# Patient Record
Sex: Female | Born: 1960
Health system: Southern US, Community
[De-identification: ages and names within clinical notes are randomized; demographics above are authoritative.]

## PROBLEM LIST (undated history)

## (undated) ENCOUNTER — Emergency Department (HOSPITAL_BASED_OUTPATIENT_CLINIC_OR_DEPARTMENT_OTHER): Payer: Commercial Managed Care - HMO | Source: Home / Self Care

## (undated) ENCOUNTER — Emergency Department (HOSPITAL_COMMUNITY): Admission: EM | Payer: No Typology Code available for payment source | Source: Home / Self Care

## (undated) DIAGNOSIS — K219 Gastro-esophageal reflux disease without esophagitis: Secondary | ICD-10-CM

## (undated) DIAGNOSIS — M7542 Impingement syndrome of left shoulder: Secondary | ICD-10-CM

## (undated) DIAGNOSIS — M654 Radial styloid tenosynovitis [de Quervain]: Secondary | ICD-10-CM

## (undated) DIAGNOSIS — E785 Hyperlipidemia, unspecified: Secondary | ICD-10-CM

## (undated) DIAGNOSIS — I10 Essential (primary) hypertension: Secondary | ICD-10-CM

## (undated) DIAGNOSIS — R51 Headache: Secondary | ICD-10-CM

## (undated) DIAGNOSIS — K589 Irritable bowel syndrome without diarrhea: Secondary | ICD-10-CM

## (undated) DIAGNOSIS — J302 Other seasonal allergic rhinitis: Secondary | ICD-10-CM

## (undated) DIAGNOSIS — A4902 Methicillin resistant Staphylococcus aureus infection, unspecified site: Secondary | ICD-10-CM

## (undated) DIAGNOSIS — L309 Dermatitis, unspecified: Secondary | ICD-10-CM

## (undated) DIAGNOSIS — Z9889 Other specified postprocedural states: Secondary | ICD-10-CM

## (undated) DIAGNOSIS — E119 Type 2 diabetes mellitus without complications: Secondary | ICD-10-CM

## (undated) HISTORY — PX: TONSILLECTOMY: SUR1361

## (undated) HISTORY — PX: APPENDECTOMY: SHX54

## (undated) HISTORY — DX: Headache: R51

## (undated) HISTORY — DX: Hyperlipidemia, unspecified: E78.5

## (undated) HISTORY — PX: ABDOMINAL HYSTERECTOMY: SHX81

## (undated) HISTORY — DX: Methicillin resistant Staphylococcus aureus infection, unspecified site: A49.02

## (undated) HISTORY — DX: Other seasonal allergic rhinitis: J30.2

---
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
## 1993-04-16 HISTORY — PX: BACK SURGERY: SHX140

## 1997-08-14 ENCOUNTER — Encounter (INDEPENDENT_AMBULATORY_CARE_PROVIDER_SITE_OTHER): Payer: Self-pay | Admitting: *Deleted

## 1997-08-14 LAB — CONVERTED CEMR LAB

## 1997-09-17 ENCOUNTER — Encounter: Admission: RE | Admit: 1997-09-17 | Discharge: 1997-09-17 | Payer: Self-pay | Admitting: Family Medicine

## 1997-10-06 ENCOUNTER — Encounter: Admission: RE | Admit: 1997-10-06 | Discharge: 1997-10-06 | Payer: Self-pay | Admitting: Family Medicine

## 1997-12-06 ENCOUNTER — Encounter: Admission: RE | Admit: 1997-12-06 | Discharge: 1997-12-06 | Payer: Self-pay | Admitting: Family Medicine

## 1997-12-17 ENCOUNTER — Encounter: Admission: RE | Admit: 1997-12-17 | Discharge: 1997-12-17 | Payer: Self-pay | Admitting: Family Medicine

## 1997-12-31 ENCOUNTER — Encounter: Admission: RE | Admit: 1997-12-31 | Discharge: 1997-12-31 | Payer: Self-pay | Admitting: Family Medicine

## 1998-04-06 ENCOUNTER — Encounter: Admission: RE | Admit: 1998-04-06 | Discharge: 1998-04-06 | Payer: Self-pay | Admitting: Sports Medicine

## 1998-06-13 ENCOUNTER — Encounter: Admission: RE | Admit: 1998-06-13 | Discharge: 1998-06-13 | Payer: Self-pay | Admitting: Family Medicine

## 1998-10-27 ENCOUNTER — Encounter: Admission: RE | Admit: 1998-10-27 | Discharge: 1998-10-27 | Payer: Self-pay | Admitting: Family Medicine

## 1998-11-18 ENCOUNTER — Encounter: Admission: RE | Admit: 1998-11-18 | Discharge: 1998-11-18 | Payer: Self-pay | Admitting: Family Medicine

## 1998-12-12 ENCOUNTER — Encounter: Admission: RE | Admit: 1998-12-12 | Discharge: 1998-12-12 | Payer: Self-pay | Admitting: Family Medicine

## 1999-02-17 ENCOUNTER — Encounter: Admission: RE | Admit: 1999-02-17 | Discharge: 1999-02-17 | Payer: Self-pay | Admitting: Family Medicine

## 1999-10-19 ENCOUNTER — Encounter: Admission: RE | Admit: 1999-10-19 | Discharge: 1999-10-19 | Payer: Self-pay | Admitting: Family Medicine

## 2000-02-01 ENCOUNTER — Encounter: Admission: RE | Admit: 2000-02-01 | Discharge: 2000-02-01 | Payer: Self-pay | Admitting: Family Medicine

## 2000-02-09 ENCOUNTER — Encounter (INDEPENDENT_AMBULATORY_CARE_PROVIDER_SITE_OTHER): Payer: Self-pay | Admitting: Internal Medicine

## 2000-02-09 ENCOUNTER — Encounter: Payer: Self-pay | Admitting: *Deleted

## 2000-02-09 ENCOUNTER — Encounter: Admission: RE | Admit: 2000-02-09 | Discharge: 2000-02-09 | Payer: Self-pay | Admitting: *Deleted

## 2000-02-23 ENCOUNTER — Encounter: Admission: RE | Admit: 2000-02-23 | Discharge: 2000-02-23 | Payer: Self-pay | Admitting: Family Medicine

## 2000-06-28 ENCOUNTER — Emergency Department (HOSPITAL_COMMUNITY): Admission: EM | Admit: 2000-06-28 | Discharge: 2000-06-29 | Payer: Self-pay | Admitting: Emergency Medicine

## 2000-06-29 ENCOUNTER — Encounter: Payer: Self-pay | Admitting: Emergency Medicine

## 2000-07-01 ENCOUNTER — Encounter: Admission: RE | Admit: 2000-07-01 | Discharge: 2000-07-01 | Payer: Self-pay | Admitting: Family Medicine

## 2000-07-16 ENCOUNTER — Encounter: Admission: RE | Admit: 2000-07-16 | Discharge: 2000-07-16 | Payer: Self-pay | Admitting: Sports Medicine

## 2000-10-10 ENCOUNTER — Emergency Department (HOSPITAL_COMMUNITY): Admission: EM | Admit: 2000-10-10 | Discharge: 2000-10-10 | Payer: Self-pay | Admitting: Emergency Medicine

## 2000-10-10 ENCOUNTER — Encounter: Payer: Self-pay | Admitting: Emergency Medicine

## 2001-01-01 ENCOUNTER — Encounter (INDEPENDENT_AMBULATORY_CARE_PROVIDER_SITE_OTHER): Payer: Self-pay | Admitting: Specialist

## 2001-01-01 ENCOUNTER — Encounter: Admission: RE | Admit: 2001-01-01 | Discharge: 2001-01-01 | Payer: Self-pay | Admitting: Family Medicine

## 2001-01-09 ENCOUNTER — Encounter: Admission: RE | Admit: 2001-01-09 | Discharge: 2001-01-09 | Payer: Self-pay | Admitting: Sports Medicine

## 2001-01-09 ENCOUNTER — Encounter: Payer: Self-pay | Admitting: Sports Medicine

## 2001-01-10 ENCOUNTER — Emergency Department (HOSPITAL_COMMUNITY): Admission: EM | Admit: 2001-01-10 | Discharge: 2001-01-10 | Payer: Self-pay | Admitting: Emergency Medicine

## 2001-01-13 ENCOUNTER — Encounter: Admission: RE | Admit: 2001-01-13 | Discharge: 2001-01-13 | Payer: Self-pay | Admitting: Family Medicine

## 2001-01-30 ENCOUNTER — Encounter: Admission: RE | Admit: 2001-01-30 | Discharge: 2001-01-30 | Payer: Self-pay | Admitting: Family Medicine

## 2001-02-20 ENCOUNTER — Encounter: Admission: RE | Admit: 2001-02-20 | Discharge: 2001-02-20 | Payer: Self-pay | Admitting: Obstetrics

## 2001-04-03 ENCOUNTER — Encounter: Admission: RE | Admit: 2001-04-03 | Discharge: 2001-04-03 | Payer: Self-pay | Admitting: Obstetrics

## 2001-04-28 ENCOUNTER — Inpatient Hospital Stay (HOSPITAL_COMMUNITY): Admission: RE | Admit: 2001-04-28 | Discharge: 2001-05-01 | Payer: Self-pay | Admitting: Obstetrics

## 2001-04-28 ENCOUNTER — Encounter (INDEPENDENT_AMBULATORY_CARE_PROVIDER_SITE_OTHER): Payer: Self-pay | Admitting: Specialist

## 2001-05-06 ENCOUNTER — Encounter: Admission: RE | Admit: 2001-05-06 | Discharge: 2001-05-06 | Payer: Self-pay | Admitting: Obstetrics

## 2001-05-10 ENCOUNTER — Inpatient Hospital Stay (HOSPITAL_COMMUNITY): Admission: AD | Admit: 2001-05-10 | Discharge: 2001-05-10 | Payer: Self-pay | Admitting: Obstetrics

## 2001-05-15 ENCOUNTER — Encounter: Admission: RE | Admit: 2001-05-15 | Discharge: 2001-05-15 | Payer: Self-pay | Admitting: Obstetrics

## 2001-05-27 ENCOUNTER — Encounter: Admission: RE | Admit: 2001-05-27 | Discharge: 2001-05-27 | Payer: Self-pay | Admitting: *Deleted

## 2001-06-10 ENCOUNTER — Encounter: Admission: RE | Admit: 2001-06-10 | Discharge: 2001-06-10 | Payer: Self-pay | Admitting: *Deleted

## 2001-06-17 ENCOUNTER — Encounter: Admission: RE | Admit: 2001-06-17 | Discharge: 2001-06-17 | Payer: Self-pay | Admitting: *Deleted

## 2001-07-07 ENCOUNTER — Encounter: Admission: RE | Admit: 2001-07-07 | Discharge: 2001-07-07 | Payer: Self-pay | Admitting: Obstetrics and Gynecology

## 2001-07-17 ENCOUNTER — Encounter: Admission: RE | Admit: 2001-07-17 | Discharge: 2001-07-17 | Payer: Self-pay | Admitting: *Deleted

## 2002-02-27 ENCOUNTER — Encounter: Admission: RE | Admit: 2002-02-27 | Discharge: 2002-02-27 | Payer: Self-pay | Admitting: *Deleted

## 2002-03-24 ENCOUNTER — Encounter: Admission: RE | Admit: 2002-03-24 | Discharge: 2002-03-24 | Payer: Self-pay | Admitting: Internal Medicine

## 2002-12-10 ENCOUNTER — Encounter: Admission: RE | Admit: 2002-12-10 | Discharge: 2002-12-10 | Payer: Self-pay | Admitting: Family Medicine

## 2002-12-10 ENCOUNTER — Encounter (INDEPENDENT_AMBULATORY_CARE_PROVIDER_SITE_OTHER): Payer: Self-pay | Admitting: Internal Medicine

## 2002-12-15 ENCOUNTER — Ambulatory Visit (HOSPITAL_COMMUNITY): Admission: RE | Admit: 2002-12-15 | Discharge: 2002-12-15 | Payer: Self-pay | Admitting: *Deleted

## 2003-07-09 ENCOUNTER — Emergency Department (HOSPITAL_COMMUNITY): Admission: AD | Admit: 2003-07-09 | Discharge: 2003-07-09 | Payer: Self-pay | Admitting: Family Medicine

## 2003-08-18 ENCOUNTER — Emergency Department (HOSPITAL_COMMUNITY): Admission: EM | Admit: 2003-08-18 | Discharge: 2003-08-18 | Payer: Self-pay | Admitting: Emergency Medicine

## 2003-08-20 ENCOUNTER — Encounter: Admission: RE | Admit: 2003-08-20 | Discharge: 2003-08-20 | Payer: Self-pay | Admitting: Internal Medicine

## 2003-08-27 ENCOUNTER — Encounter: Admission: RE | Admit: 2003-08-27 | Discharge: 2003-08-27 | Payer: Self-pay | Admitting: Internal Medicine

## 2003-09-24 ENCOUNTER — Ambulatory Visit (HOSPITAL_COMMUNITY): Admission: RE | Admit: 2003-09-24 | Discharge: 2003-09-24 | Payer: Self-pay | Admitting: Internal Medicine

## 2003-09-24 ENCOUNTER — Encounter: Admission: RE | Admit: 2003-09-24 | Discharge: 2003-09-24 | Payer: Self-pay | Admitting: Internal Medicine

## 2003-10-29 ENCOUNTER — Encounter: Admission: RE | Admit: 2003-10-29 | Discharge: 2003-10-29 | Payer: Self-pay | Admitting: Family Medicine

## 2003-11-12 ENCOUNTER — Emergency Department (HOSPITAL_COMMUNITY): Admission: EM | Admit: 2003-11-12 | Discharge: 2003-11-12 | Payer: Self-pay | Admitting: Emergency Medicine

## 2003-11-29 ENCOUNTER — Encounter: Admission: RE | Admit: 2003-11-29 | Discharge: 2003-11-29 | Payer: Self-pay | Admitting: Orthopedic Surgery

## 2003-12-23 ENCOUNTER — Ambulatory Visit (HOSPITAL_COMMUNITY): Admission: RE | Admit: 2003-12-23 | Discharge: 2003-12-23 | Payer: Self-pay | Admitting: Internal Medicine

## 2004-01-14 ENCOUNTER — Ambulatory Visit: Payer: Self-pay | Admitting: Internal Medicine

## 2004-01-27 ENCOUNTER — Ambulatory Visit: Payer: Self-pay | Admitting: Internal Medicine

## 2004-02-05 ENCOUNTER — Emergency Department (HOSPITAL_COMMUNITY): Admission: EM | Admit: 2004-02-05 | Discharge: 2004-02-05 | Payer: Self-pay | Admitting: Emergency Medicine

## 2004-02-10 ENCOUNTER — Ambulatory Visit: Payer: Self-pay | Admitting: Internal Medicine

## 2004-04-04 ENCOUNTER — Ambulatory Visit: Payer: Self-pay | Admitting: Internal Medicine

## 2004-07-17 ENCOUNTER — Ambulatory Visit: Payer: Self-pay | Admitting: Internal Medicine

## 2004-07-17 ENCOUNTER — Ambulatory Visit (HOSPITAL_COMMUNITY): Admission: RE | Admit: 2004-07-17 | Discharge: 2004-07-17 | Payer: Self-pay | Admitting: Internal Medicine

## 2004-08-09 ENCOUNTER — Ambulatory Visit: Payer: Self-pay | Admitting: Internal Medicine

## 2004-09-06 ENCOUNTER — Ambulatory Visit: Payer: Self-pay | Admitting: Internal Medicine

## 2005-03-30 ENCOUNTER — Ambulatory Visit: Payer: Self-pay

## 2005-08-04 ENCOUNTER — Emergency Department (HOSPITAL_COMMUNITY): Admission: EM | Admit: 2005-08-04 | Discharge: 2005-08-04 | Payer: Self-pay | Admitting: Emergency Medicine

## 2005-08-04 ENCOUNTER — Emergency Department (HOSPITAL_COMMUNITY): Admission: EM | Admit: 2005-08-04 | Discharge: 2005-08-04 | Payer: Self-pay | Admitting: Family Medicine

## 2005-08-07 ENCOUNTER — Ambulatory Visit: Payer: Self-pay | Admitting: Internal Medicine

## 2005-11-05 ENCOUNTER — Ambulatory Visit: Payer: Self-pay | Admitting: Internal Medicine

## 2006-04-04 DIAGNOSIS — M549 Dorsalgia, unspecified: Secondary | ICD-10-CM

## 2006-04-04 DIAGNOSIS — G8929 Other chronic pain: Secondary | ICD-10-CM

## 2006-06-10 ENCOUNTER — Encounter (INDEPENDENT_AMBULATORY_CARE_PROVIDER_SITE_OTHER): Payer: Self-pay | Admitting: *Deleted

## 2006-06-10 ENCOUNTER — Ambulatory Visit: Payer: Self-pay | Admitting: Internal Medicine

## 2006-06-10 DIAGNOSIS — R51 Headache: Secondary | ICD-10-CM

## 2006-06-10 DIAGNOSIS — R519 Headache, unspecified: Secondary | ICD-10-CM | POA: Insufficient documentation

## 2006-06-10 DIAGNOSIS — I10 Essential (primary) hypertension: Secondary | ICD-10-CM

## 2006-06-10 DIAGNOSIS — F329 Major depressive disorder, single episode, unspecified: Secondary | ICD-10-CM

## 2006-06-10 LAB — CONVERTED CEMR LAB
Bilirubin Urine: NEGATIVE
Ketones, ur: NEGATIVE mg/dL
Urobilinogen, UA: 0.2 (ref 0.0–1.0)
pH: 6 (ref 5.0–8.0)

## 2006-06-13 ENCOUNTER — Telehealth (INDEPENDENT_AMBULATORY_CARE_PROVIDER_SITE_OTHER): Payer: Self-pay | Admitting: *Deleted

## 2006-06-14 ENCOUNTER — Encounter (INDEPENDENT_AMBULATORY_CARE_PROVIDER_SITE_OTHER): Payer: Self-pay | Admitting: *Deleted

## 2006-06-17 ENCOUNTER — Encounter (INDEPENDENT_AMBULATORY_CARE_PROVIDER_SITE_OTHER): Payer: Self-pay | Admitting: Internal Medicine

## 2006-06-17 ENCOUNTER — Ambulatory Visit: Payer: Self-pay | Admitting: *Deleted

## 2006-06-17 LAB — CONVERTED CEMR LAB
Chlamydia, DNA Probe: NEGATIVE
GC Probe Amp, Genital: NEGATIVE
Nitrite: NEGATIVE
Protein, ur: NEGATIVE mg/dL
RBC / HPF: NONE SEEN (ref <3)
Specific Gravity, Urine: 1.02 (ref 1.005–1.03)

## 2006-06-18 ENCOUNTER — Telehealth (INDEPENDENT_AMBULATORY_CARE_PROVIDER_SITE_OTHER): Payer: Self-pay | Admitting: *Deleted

## 2006-09-25 ENCOUNTER — Telehealth (INDEPENDENT_AMBULATORY_CARE_PROVIDER_SITE_OTHER): Payer: Self-pay | Admitting: *Deleted

## 2006-10-25 ENCOUNTER — Encounter: Payer: Self-pay | Admitting: Internal Medicine

## 2006-10-25 ENCOUNTER — Ambulatory Visit: Payer: Self-pay | Admitting: Internal Medicine

## 2006-10-25 LAB — CONVERTED CEMR LAB
Albumin: 4.1 g/dL (ref 3.5–5.2)
Bilirubin Urine: NEGATIVE
Chloride: 104 meq/L (ref 96–112)
Cholesterol: 181 mg/dL (ref 0–200)
Hemoglobin, Urine: NEGATIVE
Ketones, ur: NEGATIVE mg/dL
Potassium: 4.3 meq/L (ref 3.5–5.3)
Protein, ur: NEGATIVE mg/dL
Specific Gravity, Urine: 1.022 (ref 1.005–1.03)
Total Bilirubin: 0.7 mg/dL (ref 0.3–1.2)
Total CHOL/HDL Ratio: 4.3
Total Protein: 7 g/dL (ref 6.0–8.3)
Triglycerides: 73 mg/dL (ref <150)
Urine Glucose: NEGATIVE mg/dL
Urobilinogen, UA: 0.2 (ref 0.0–1.0)

## 2006-11-14 ENCOUNTER — Encounter: Payer: Self-pay | Admitting: Internal Medicine

## 2006-11-14 ENCOUNTER — Ambulatory Visit: Payer: Self-pay | Admitting: Internal Medicine

## 2006-11-14 LAB — CONVERTED CEMR LAB
Bilirubin Urine: NEGATIVE
Chlamydia, DNA Probe: NEGATIVE
GC Probe Amp, Genital: NEGATIVE
Gardnerella vaginalis: NEGATIVE
Ketones, ur: NEGATIVE mg/dL
Leukocytes, UA: NEGATIVE
Specific Gravity, Urine: 1.027 (ref 1.005–1.03)
pH: 7 (ref 5.0–8.0)

## 2006-11-18 ENCOUNTER — Telehealth: Payer: Self-pay | Admitting: *Deleted

## 2007-02-24 ENCOUNTER — Encounter (INDEPENDENT_AMBULATORY_CARE_PROVIDER_SITE_OTHER): Payer: Self-pay | Admitting: Internal Medicine

## 2007-02-24 ENCOUNTER — Ambulatory Visit: Payer: Self-pay | Admitting: Hospitalist

## 2007-02-24 LAB — CONVERTED CEMR LAB
Basophils Absolute: 0 10*3/uL (ref 0.0–0.1)
Eosinophils Relative: 4 % (ref 0–5)
HCT: 36.7 % (ref 36.0–46.0)
Hemoglobin: 12 g/dL (ref 12.0–15.0)
Lymphocytes Relative: 38 % (ref 12–46)
Monocytes Absolute: 0.5 10*3/uL (ref 0.1–1.0)
Monocytes Relative: 7 % (ref 3–12)
Neutro Abs: 3.8 10*3/uL (ref 1.7–7.7)
RBC: 4.19 M/uL (ref 3.87–5.11)
RDW: 14.3 % (ref 11.5–15.5)

## 2007-03-09 ENCOUNTER — Encounter (INDEPENDENT_AMBULATORY_CARE_PROVIDER_SITE_OTHER): Payer: Self-pay | Admitting: Hospitalist

## 2007-03-09 ENCOUNTER — Emergency Department (HOSPITAL_COMMUNITY): Admission: EM | Admit: 2007-03-09 | Discharge: 2007-03-09 | Payer: Self-pay | Admitting: Emergency Medicine

## 2007-03-09 ENCOUNTER — Encounter (INDEPENDENT_AMBULATORY_CARE_PROVIDER_SITE_OTHER): Payer: Self-pay | Admitting: Internal Medicine

## 2007-03-18 ENCOUNTER — Ambulatory Visit: Payer: Self-pay | Admitting: Hospitalist

## 2007-05-05 ENCOUNTER — Encounter: Payer: Self-pay | Admitting: Internal Medicine

## 2007-05-20 ENCOUNTER — Ambulatory Visit: Payer: Self-pay | Admitting: Internal Medicine

## 2007-05-20 ENCOUNTER — Encounter: Payer: Self-pay | Admitting: Internal Medicine

## 2007-05-28 ENCOUNTER — Ambulatory Visit: Payer: Self-pay | Admitting: Internal Medicine

## 2007-06-04 ENCOUNTER — Encounter: Payer: Self-pay | Admitting: Internal Medicine

## 2007-06-12 ENCOUNTER — Ambulatory Visit: Payer: Self-pay | Admitting: Internal Medicine

## 2007-06-25 ENCOUNTER — Encounter: Payer: Self-pay | Admitting: Internal Medicine

## 2007-07-02 ENCOUNTER — Emergency Department (HOSPITAL_COMMUNITY): Admission: EM | Admit: 2007-07-02 | Discharge: 2007-07-03 | Payer: Self-pay | Admitting: Emergency Medicine

## 2007-07-04 ENCOUNTER — Ambulatory Visit: Payer: Self-pay | Admitting: Hospitalist

## 2007-07-04 ENCOUNTER — Ambulatory Visit (HOSPITAL_COMMUNITY): Admission: RE | Admit: 2007-07-04 | Discharge: 2007-07-04 | Payer: Self-pay | Admitting: Hospitalist

## 2007-09-18 ENCOUNTER — Ambulatory Visit (HOSPITAL_COMMUNITY): Admission: RE | Admit: 2007-09-18 | Discharge: 2007-09-18 | Payer: Self-pay | Admitting: Internal Medicine

## 2007-09-18 ENCOUNTER — Ambulatory Visit: Payer: Self-pay | Admitting: Internal Medicine

## 2007-09-18 DIAGNOSIS — M25519 Pain in unspecified shoulder: Secondary | ICD-10-CM

## 2007-10-20 ENCOUNTER — Encounter: Payer: Self-pay | Admitting: Internal Medicine

## 2007-11-19 ENCOUNTER — Ambulatory Visit: Payer: Self-pay | Admitting: Internal Medicine

## 2008-01-01 ENCOUNTER — Encounter: Admission: RE | Admit: 2008-01-01 | Discharge: 2008-02-03 | Payer: Self-pay | Admitting: Internal Medicine

## 2008-02-03 ENCOUNTER — Ambulatory Visit: Payer: Self-pay | Admitting: Internal Medicine

## 2008-02-03 ENCOUNTER — Encounter: Payer: Self-pay | Admitting: Internal Medicine

## 2008-02-03 DIAGNOSIS — K589 Irritable bowel syndrome without diarrhea: Secondary | ICD-10-CM | POA: Insufficient documentation

## 2008-02-03 DIAGNOSIS — L732 Hidradenitis suppurativa: Secondary | ICD-10-CM

## 2008-02-03 HISTORY — DX: Irritable bowel syndrome, unspecified: K58.9

## 2008-02-08 LAB — CONVERTED CEMR LAB
Cholesterol: 221 mg/dL — ABNORMAL HIGH (ref 0–200)
HDL: 49 mg/dL (ref >39)
LDL Cholesterol: 155 mg/dL — ABNORMAL HIGH (ref 0–99)
Triglycerides: 84 mg/dL (ref <150)

## 2008-02-10 ENCOUNTER — Encounter: Payer: Self-pay | Admitting: Internal Medicine

## 2008-02-20 ENCOUNTER — Encounter: Payer: Self-pay | Admitting: Internal Medicine

## 2008-02-28 ENCOUNTER — Emergency Department (HOSPITAL_COMMUNITY): Admission: EM | Admit: 2008-02-28 | Discharge: 2008-02-29 | Payer: Self-pay | Admitting: Emergency Medicine

## 2008-03-01 ENCOUNTER — Encounter: Payer: Self-pay | Admitting: Internal Medicine

## 2008-03-01 ENCOUNTER — Ambulatory Visit: Payer: Self-pay | Admitting: *Deleted

## 2008-03-19 ENCOUNTER — Emergency Department (HOSPITAL_COMMUNITY): Admission: EM | Admit: 2008-03-19 | Discharge: 2008-03-20 | Payer: Self-pay | Admitting: Emergency Medicine

## 2008-03-25 ENCOUNTER — Encounter: Payer: Self-pay | Admitting: Internal Medicine

## 2008-03-25 ENCOUNTER — Ambulatory Visit: Payer: Self-pay | Admitting: Internal Medicine

## 2008-03-25 LAB — CONVERTED CEMR LAB
ALT: 16 units/L (ref 0–35)
AST: 14 units/L (ref 0–37)
Albumin: 4.4 g/dL (ref 3.5–5.2)
Alkaline Phosphatase: 65 units/L (ref 39–117)
Basophils Absolute: 0 10*3/uL (ref 0.0–0.1)
Bilirubin Urine: NEGATIVE
Chloride: 102 meq/L (ref 96–112)
Eosinophils Absolute: 0.3 10*3/uL (ref 0.0–0.7)
Eosinophils Relative: 3 % (ref 0–5)
HCT: 39.6 % (ref 36.0–46.0)
Leukocytes, UA: NEGATIVE
MCHC: 32.6 g/dL (ref 30.0–36.0)
MCV: 86.3 fL (ref 78.0–100.0)
Monocytes Absolute: 0.5 10*3/uL (ref 0.1–1.0)
Nitrite: NEGATIVE
Platelets: 350 10*3/uL (ref 150–400)
Potassium: 3.7 meq/L (ref 3.5–5.3)
Protein, ur: NEGATIVE mg/dL
RDW: 14.4 % (ref 11.5–15.5)
Specific Gravity, Urine: 1.017 (ref 1.005–1.03)
Total Protein: 7.4 g/dL (ref 6.0–8.3)
Urobilinogen, UA: 1 (ref 0.0–1.0)
WBC: 9.7 10*3/uL (ref 4.0–10.5)

## 2008-03-29 ENCOUNTER — Encounter: Payer: Self-pay | Admitting: Internal Medicine

## 2008-03-29 ENCOUNTER — Encounter: Admission: RE | Admit: 2008-03-29 | Discharge: 2008-04-15 | Payer: Self-pay | Admitting: Internal Medicine

## 2008-04-19 ENCOUNTER — Encounter: Admission: RE | Admit: 2008-04-19 | Discharge: 2008-05-10 | Payer: Self-pay | Admitting: Internal Medicine

## 2008-04-19 ENCOUNTER — Emergency Department (HOSPITAL_COMMUNITY): Admission: EM | Admit: 2008-04-19 | Discharge: 2008-04-19 | Payer: Self-pay | Admitting: Emergency Medicine

## 2008-05-18 ENCOUNTER — Telehealth: Payer: Self-pay | Admitting: Internal Medicine

## 2008-05-19 ENCOUNTER — Ambulatory Visit: Payer: Self-pay | Admitting: *Deleted

## 2008-05-19 ENCOUNTER — Encounter: Payer: Self-pay | Admitting: Internal Medicine

## 2008-06-10 ENCOUNTER — Encounter: Admission: RE | Admit: 2008-06-10 | Discharge: 2008-09-08 | Payer: Self-pay | Admitting: Internal Medicine

## 2008-06-11 ENCOUNTER — Encounter: Payer: Self-pay | Admitting: Internal Medicine

## 2008-06-11 ENCOUNTER — Ambulatory Visit: Payer: Self-pay | Admitting: Internal Medicine

## 2008-06-18 ENCOUNTER — Ambulatory Visit (HOSPITAL_COMMUNITY): Admission: RE | Admit: 2008-06-18 | Discharge: 2008-06-18 | Payer: Self-pay | Admitting: Internal Medicine

## 2008-07-22 ENCOUNTER — Encounter: Payer: Self-pay | Admitting: Internal Medicine

## 2008-09-10 ENCOUNTER — Ambulatory Visit: Payer: Self-pay | Admitting: *Deleted

## 2008-09-10 ENCOUNTER — Encounter: Payer: Self-pay | Admitting: Internal Medicine

## 2008-09-16 ENCOUNTER — Ambulatory Visit: Payer: Self-pay | Admitting: Sports Medicine

## 2008-10-19 ENCOUNTER — Encounter: Payer: Self-pay | Admitting: Internal Medicine

## 2008-10-19 ENCOUNTER — Ambulatory Visit: Payer: Self-pay | Admitting: Internal Medicine

## 2008-11-01 ENCOUNTER — Encounter (INDEPENDENT_AMBULATORY_CARE_PROVIDER_SITE_OTHER): Payer: Self-pay | Admitting: Internal Medicine

## 2008-11-01 ENCOUNTER — Ambulatory Visit: Payer: Self-pay | Admitting: Infectious Diseases

## 2008-11-04 ENCOUNTER — Encounter (INDEPENDENT_AMBULATORY_CARE_PROVIDER_SITE_OTHER): Payer: Self-pay | Admitting: Internal Medicine

## 2008-11-04 LAB — CONVERTED CEMR LAB
HDL: 41 mg/dL (ref >39)
Total CHOL/HDL Ratio: 5.6
VLDL: 22 mg/dL (ref 0–40)

## 2008-11-05 DIAGNOSIS — E785 Hyperlipidemia, unspecified: Secondary | ICD-10-CM

## 2008-11-05 LAB — CONVERTED CEMR LAB
BUN: 14 mg/dL (ref 6–23)
Creatinine, Ser: 0.82 mg/dL (ref 0.40–1.20)
Eosinophils Absolute: 0.1 10*3/uL (ref 0.0–0.7)
Eosinophils Relative: 2 % (ref 0–5)
Glucose, Bld: 94 mg/dL (ref 70–99)
HCT: 40.4 % (ref 36.0–46.0)
Hemoglobin: 13.5 g/dL (ref 12.0–15.0)
Lymphocytes Relative: 21 % (ref 12–46)
Lymphs Abs: 1.4 10*3/uL (ref 0.7–4.0)
Monocytes Absolute: 0.6 10*3/uL (ref 0.1–1.0)
Platelets: 302 10*3/uL (ref 150–400)
Potassium: 4.1 meq/L (ref 3.5–5.3)
RBC: 4.63 M/uL (ref 3.87–5.11)
RDW: 14.1 % (ref 11.5–15.5)
Sodium: 140 meq/L (ref 135–145)
WBC: 6.8 10*3/uL (ref 4.0–10.5)

## 2008-11-12 ENCOUNTER — Ambulatory Visit: Payer: Self-pay | Admitting: Family Medicine

## 2008-12-09 ENCOUNTER — Encounter: Payer: Self-pay | Admitting: Internal Medicine

## 2008-12-09 ENCOUNTER — Ambulatory Visit: Payer: Self-pay | Admitting: Internal Medicine

## 2008-12-13 ENCOUNTER — Encounter: Payer: Self-pay | Admitting: Internal Medicine

## 2009-02-14 ENCOUNTER — Ambulatory Visit: Payer: Self-pay | Admitting: Family Medicine

## 2009-02-14 ENCOUNTER — Encounter (INDEPENDENT_AMBULATORY_CARE_PROVIDER_SITE_OTHER): Payer: Self-pay | Admitting: *Deleted

## 2009-04-28 ENCOUNTER — Ambulatory Visit: Payer: Self-pay | Admitting: Internal Medicine

## 2009-04-28 DIAGNOSIS — K219 Gastro-esophageal reflux disease without esophagitis: Secondary | ICD-10-CM | POA: Insufficient documentation

## 2009-04-29 ENCOUNTER — Encounter: Payer: Self-pay | Admitting: Internal Medicine

## 2009-05-11 ENCOUNTER — Encounter: Payer: Self-pay | Admitting: Internal Medicine

## 2009-05-11 ENCOUNTER — Telehealth: Payer: Self-pay | Admitting: Internal Medicine

## 2009-05-20 LAB — CONVERTED CEMR LAB
ALT: 14 units/L (ref 0–35)
AST: 16 units/L (ref 0–37)
Albumin: 4.3 g/dL (ref 3.5–5.2)
Basophils Relative: 0 % (ref 0–1)
Casts: NONE SEEN /lpf
Chloride: 104 meq/L (ref 96–112)
Cholesterol: 230 mg/dL — ABNORMAL HIGH (ref 0–200)
Eosinophils Absolute: 0.2 10*3/uL (ref 0.0–0.7)
Eosinophils Relative: 3 % (ref 0–5)
HCT: 40.6 % (ref 36.0–46.0)
Hemoglobin, Urine: NEGATIVE
Hemoglobin: 12.8 g/dL (ref 12.0–15.0)
Ketones, ur: NEGATIVE mg/dL
LDL Cholesterol: 154 mg/dL — ABNORMAL HIGH (ref 0–99)
MCHC: 31.5 g/dL (ref 30.0–36.0)
MCV: 86.4 fL (ref >=78.0)
Monocytes Absolute: 0.4 10*3/uL (ref 0.1–1.0)
Monocytes Relative: 6 % (ref 3–12)
Nitrite: NEGATIVE
Protein, ur: NEGATIVE mg/dL
RDW: 14.5 % (ref 11.5–15.5)
Specific Gravity, Urine: 1.021 (ref 1.005–1.0)
Total Protein: 7.1 g/dL (ref 6.0–8.3)
Triglycerides: 153 mg/dL — ABNORMAL HIGH (ref <150)
Urine Glucose: NEGATIVE mg/dL
pH: 6 (ref 5.0–8.0)

## 2009-06-02 ENCOUNTER — Ambulatory Visit: Payer: Self-pay | Admitting: Internal Medicine

## 2009-06-02 LAB — CONVERTED CEMR LAB: Pap Smear: NEGATIVE

## 2009-06-14 ENCOUNTER — Telehealth: Payer: Self-pay | Admitting: Internal Medicine

## 2009-07-04 ENCOUNTER — Ambulatory Visit: Payer: Self-pay | Admitting: Family Medicine

## 2009-07-04 ENCOUNTER — Emergency Department (HOSPITAL_COMMUNITY): Admission: EM | Admit: 2009-07-04 | Discharge: 2009-07-04 | Payer: Self-pay | Admitting: Emergency Medicine

## 2009-07-08 ENCOUNTER — Encounter: Payer: Self-pay | Admitting: Internal Medicine

## 2009-07-25 ENCOUNTER — Encounter: Payer: Self-pay | Admitting: Family Medicine

## 2009-07-25 ENCOUNTER — Telehealth: Payer: Self-pay | Admitting: Internal Medicine

## 2009-08-04 ENCOUNTER — Ambulatory Visit: Payer: Self-pay | Admitting: Internal Medicine

## 2009-08-04 LAB — CONVERTED CEMR LAB
AST: 9 units/L (ref 0–37)
Albumin: 4 g/dL (ref 3.5–5.2)
Alkaline Phosphatase: 64 units/L (ref 39–117)
Basophils Absolute: 0 10*3/uL (ref 0.0–0.1)
Chloride: 108 meq/L (ref 96–112)
Eosinophils Absolute: 0.1 10*3/uL (ref 0.0–0.7)
Eosinophils Relative: 1 % (ref 0–5)
Hemoglobin: 12.3 g/dL (ref 12.0–15.0)
Lymphs Abs: 2.8 10*3/uL (ref 0.7–4.0)
MCV: 86.2 fL (ref >=78.0)
Neutro Abs: 5.1 10*3/uL (ref 1.7–7.7)
Neutrophils Relative %: 60 % (ref 43–77)
Potassium: 4.2 meq/L (ref 3.5–5.3)
Total Bilirubin: 0.6 mg/dL (ref 0.3–1.2)
Total Protein: 6.7 g/dL (ref 6.0–8.3)
WBC: 8.5 10*3/uL (ref 4.0–10.5)

## 2009-08-11 ENCOUNTER — Encounter: Admission: RE | Admit: 2009-08-11 | Discharge: 2009-08-15 | Payer: Self-pay | Admitting: Family Medicine

## 2009-08-12 ENCOUNTER — Telehealth: Payer: Self-pay | Admitting: Family Medicine

## 2009-08-12 ENCOUNTER — Encounter: Payer: Self-pay | Admitting: Family Medicine

## 2009-08-29 ENCOUNTER — Ambulatory Visit: Payer: Self-pay | Admitting: Family Medicine

## 2009-09-16 ENCOUNTER — Telehealth: Payer: Self-pay | Admitting: Internal Medicine

## 2009-09-22 ENCOUNTER — Ambulatory Visit: Payer: Self-pay | Admitting: Internal Medicine

## 2009-09-26 ENCOUNTER — Ambulatory Visit: Payer: Self-pay | Admitting: Family Medicine

## 2009-09-26 DIAGNOSIS — M654 Radial styloid tenosynovitis [de Quervain]: Secondary | ICD-10-CM

## 2009-09-26 DIAGNOSIS — M19019 Primary osteoarthritis, unspecified shoulder: Secondary | ICD-10-CM | POA: Insufficient documentation

## 2009-09-26 HISTORY — DX: Radial styloid tenosynovitis (de quervain): M65.4

## 2009-11-10 ENCOUNTER — Ambulatory Visit: Payer: Self-pay | Admitting: Internal Medicine

## 2009-11-11 ENCOUNTER — Encounter: Payer: Self-pay | Admitting: Internal Medicine

## 2009-11-16 ENCOUNTER — Encounter: Payer: Self-pay | Admitting: Internal Medicine

## 2010-01-03 ENCOUNTER — Ambulatory Visit: Payer: Self-pay | Admitting: Internal Medicine

## 2010-01-03 DIAGNOSIS — R300 Dysuria: Secondary | ICD-10-CM

## 2010-01-03 LAB — CONVERTED CEMR LAB
Bilirubin Urine: NEGATIVE
Crystals: NONE SEEN
Ketones, ur: NEGATIVE mg/dL
Nitrite: NEGATIVE
Specific Gravity, Urine: >=1.03
Urobilinogen, UA: 0.2

## 2010-03-13 ENCOUNTER — Ambulatory Visit: Payer: Self-pay | Admitting: Family Medicine

## 2010-04-04 ENCOUNTER — Encounter: Payer: Self-pay | Admitting: Internal Medicine

## 2010-04-04 ENCOUNTER — Encounter: Payer: Self-pay | Admitting: Family Medicine

## 2010-04-12 ENCOUNTER — Ambulatory Visit: Payer: Self-pay | Admitting: Internal Medicine

## 2010-05-07 ENCOUNTER — Encounter: Payer: Self-pay | Admitting: Internal Medicine

## 2010-05-11 ENCOUNTER — Ambulatory Visit: Admit: 2010-05-11 | Payer: Self-pay | Admitting: Internal Medicine

## 2010-05-16 ENCOUNTER — Encounter: Payer: Self-pay | Admitting: Internal Medicine

## 2010-05-16 ENCOUNTER — Ambulatory Visit
Admission: RE | Admit: 2010-05-16 | Discharge: 2010-05-16 | Payer: Self-pay | Source: Home / Self Care | Attending: Internal Medicine | Admitting: Internal Medicine

## 2010-05-16 LAB — CONVERTED CEMR LAB
Cholesterol: 204 mg/dL — ABNORMAL HIGH (ref 0–200)
HDL: 49 mg/dL (ref >39)
TSH: 0.784 microintl units/mL (ref 0.350–4.50)

## 2010-05-16 NOTE — Assessment & Plan Note (Signed)
Summary: CHECKUP/SB.   Vital Signs:  Patient profile:   50 year old female Height:      62 inches (157.48 cm) Weight:      212.04 pounds (96.38 kg) BMI:     38.92 Temp:     97 degrees F (36.11 degrees C) oral Pulse rate:   78 / minute BP sitting:   125 / 81  (right arm)  Vitals Entered By: Angelina Ok RN (November 10, 2009 10:06 AM) CC: Depression Is Patient Diabetic? No Pain Assessment Patient in pain? yes     Location: left arm to wrist Intensity: 8 Type: aching Nutritional Status BMI of > 30 = obese  Does patient need assistance? Functional Status Self care Ambulation Normal Comments Check up.  Needs refill on Reflux med.   Primary Care Provider:  Phillips Odor  CC:  Depression.  History of Present Illness: Danielle Mason comes in today for follow-up on her shoulder pain on the left. The pain is intermittent goes from shoulder into wrist. Wrist support is helping. Some pain moving from neck into shouder. Often drops objects, pain wakes her from sleep. Splint helps some.  Depression History:      The patient denies a depressed mood most of the day.        The patient denies that she feels like life is not worth living, denies that she wishes that she were dead, and denies that she has thought about ending her life.         Preventive Screening-Counseling & Management  Alcohol-Tobacco     Alcohol drinks/day: 0     Smoking Status: never  Current Medications (verified): 1)  Hydrochlorothiazide 25 Mg Tabs (Hydrochlorothiazide) .... Take 1 Tablet By Mouth Once A Day 2)  Tylenol Extra Strength 500 Mg  Tabs (Acetaminophen) .... One Tab Every 6 Hours As Needed For Headaches 3)  Pravachol 20 Mg Tabs (Pravastatin Sodium) .... Take 1 Tablet By Mouth Once A Day 4)  Ibuprofen 800 Mg Tabs (Ibuprofen) .... Take 1 Tablet By Mouth Two Times A Day 5)  Doxycycline Hyclate 100 Mg Caps (Doxycycline Hyclate) .... Take 1 Tablet By Mouth Two Times A Day  Allergies (verified): 1)  ! Penicillin 2)   ! Penicillin  Review of Systems      See HPI  Physical Exam  General:  alert, well-developed, and well-nourished.   Neck:  supple. hypertonicity of trap muscles. Trigger point in supperior, medial Trap.  slightly limited ROM in sidebending and rotation, pain on extention. normal flexion. no adenopathy or enlarged thyroid. Lungs:  Normal respiratory effort, chest expands symmetrically. Lungs are clear to auscultation, no crackles or wheezes. Heart:  Normal rate and regular rhythm. S1 and S2 normal without gallop, murmur, click, rub or other extra sounds. Msk:  left shoulder higher than right on visual inspection with patient standing with flat feet on floor. Normal Strength-possible slight reduction in left >R but pateint is right hand dominant Pain on supraspinatus testing +crossover Pain directly over AC, radicular pain illicited on deep palpation, and circ ROM  AS tenderness left wrist.  Positive Finklestein's test. TTP especially over thumb abductor and extensor tendons.   NVI Pulses:  Normal Extremities:  No edema Neurologic:  strength normal in all extremities and sensation intact to light touch.   Skin:  no rashes.   Psych:  Slightly more depressed appaering than usual.   Impression & Recommendations:  Problem # 1:  HIDRADENITIS SUPPURATIVA (ICD-705.83) Will treat with brief course of doxy. Encouraged  warm compresses to axilla. Hypoallergenic deodorant only.  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 Will refer to orthopedist/Shoulder specialist - last option may be surgical- will explore this as a possibility and help patient make a decison about treatment/ Limited health literacy. She continues to do the gentle stretching and excercises I recommended.  The following medications were removed from the medication list:    Percocet 5-325 Mg Tabs (Oxycodone-acetaminophen) .Marland Kitchen... Take 1 tablet by mouth every 6  hours Her updated medication list for this problem includes:    Tylenol Extra Strength 500 Mg Tabs (Acetaminophen) ..... One tab every 6 hours as needed for headaches    Ibuprofen 800 Mg Tabs (Ibuprofen) .Marland Kitchen... Take 1 tablet by mouth two times a day  Problem # 3:  GERD (ICD-530.81) Symptoms improved. Will stop PPI.  The following medications were removed from the medication list:    Protonix 40 Mg Tbec (Pantoprazole sodium) .Marland Kitchen... Take 1 tablet by mouth once a day  Problem # 4:  HYPERLIPIDEMIA (ICD-272.4)  No problems with medication will check LFTs at next visit.  Her updated medication list for this problem includes:    Pravachol 20 Mg Tabs (Pravastatin sodium) .Marland Kitchen... Take 1 tablet by mouth once a day  Labs Reviewed: SGOT: 9 (08/04/2009)   SGPT: 13 (08/04/2009)   HDL:45 (04/28/2009), 41 (11/04/2008)  LDL:154 (04/28/2009), 165 (11/04/2008)  Chol:230 (04/28/2009), 228 (11/04/2008)  Trig:153 (04/28/2009), 108 (11/04/2008)  Problem # 5:  HYPERTENSION (ICD-401.9) Well controlled on HCTZ. No change. Her updated medication list for this problem includes:    Hydrochlorothiazide 25 Mg Tabs (Hydrochlorothiazide) .Marland Kitchen... Take 1 tablet by mouth once a day  BP today: 125/81 Prior BP: 124/83 (09/26/2009)  Labs Reviewed: K+: 4.2 (08/04/2009) Creat: : 0.81 (08/04/2009)   Chol: 230 (04/28/2009)   HDL: 45 (04/28/2009)   LDL: 154 (04/28/2009)   TG: 153 (04/28/2009)  Complete Medication List: 1)  Hydrochlorothiazide 25 Mg Tabs (Hydrochlorothiazide) .... Take 1 tablet by mouth once a day 2)  Tylenol Extra Strength 500 Mg Tabs (Acetaminophen) .... One tab every 6 hours as needed for headaches 3)  Pravachol 20 Mg Tabs (Pravastatin sodium) .... Take 1 tablet by mouth once a day 4)  Ibuprofen 800 Mg Tabs (Ibuprofen) .... Take 1 tablet by mouth two times a day 5)  Doxycycline Hyclate 100 Mg Caps (Doxycycline hyclate) .... Take 1 tablet by mouth two times a day  Other Orders: Orthopedic Referral  (Ortho)  Patient Instructions: 1)  F/U After you see the Shoulder Specialist Prescriptions: DOXYCYCLINE HYCLATE 100 MG CAPS (DOXYCYCLINE HYCLATE) Take 1 tablet by mouth two times a day  #14 x 2   Entered and Authorized by:   Julaine Fusi  DO   Signed by:   Julaine Fusi  DO on 11/11/2009   Method used:   Electronically to        [facility] [address] (retail)       [phone]       [place], [place]  [address]       Ph: [phone]       Fax: [phone]   RxID:   [id number]   Prevention & Chronic Care Immunizations   Influenza vaccine: refuses  (02/03/2008)   Influenza vaccine deferral: Refused  (10/19/2008)    Tetanus booster: Not documented   Td booster deferral: Refused  (10/19/2008)    Pneumococcal vaccine: Not  documented   Pneumococcal vaccine deferral: Refused  (10/19/2008)  Colorectal Screening   Hemoccult: Not documented    Colonoscopy: Not documented  Other Screening   Pap smear: NEGATIVE FOR INTRAEPITHELIAL LESIONS OR MALIGNANCY.  (06/02/2009)    Mammogram: No specific mammographic evidence of malignancy.    (09/20/2008)   Mammogram due: 09/2009   Smoking status: never  (11/10/2009)  Lipids   Total Cholesterol: 230  (04/28/2009)   Lipid panel action/deferral: Lipid Panel ordered   LDL: 154  (04/28/2009)   LDL Direct: Not documented   HDL: 45  (04/28/2009)   Triglycerides: 153  (04/28/2009)    SGOT (AST): 9  (08/04/2009)   SGPT (ALT): 13  (08/04/2009)   Alkaline phosphatase: 64  (08/04/2009)   Total bilirubin: 0.6  (08/04/2009)  Hypertension   Last Blood Pressure: 125 / 81  (11/10/2009)   Serum creatinine: 0.81  (08/04/2009)   BMP action: Ordered   Serum potassium 4.2  (08/04/2009)  Self-Management Support :    Patient will work on the following items until the next clinic visit to reach self-care goals:     Medications and monitoring: take my medicines every day, bring all of my medications to every  visit  (11/10/2009)     Eating: drink diet soda or water instead of juice or soda, eat more vegetables, use fresh or frozen vegetables, eat foods that are low in salt, eat baked foods instead of fried foods, eat fruit for snacks and desserts, limit or avoid alcohol  (11/10/2009)     Activity: take a 30 minute walk every day  (11/10/2009)    Hypertension self-management support: Written self-care plan, Education handout, Pre-printed educational material, Resources for patients handout  (11/10/2009)   Hypertension self-care plan printed.   Hypertension education handout printed    Lipid self-management support: Written self-care plan, Education handout, Pre-printed educational material, Resources for patients handout  (11/10/2009)   Lipid self-care plan printed.   Lipid education handout printed      Resource handout printed.     Vital Signs:  Patient profile:   50 year old female Height:      62 inches (157.48 cm) Weight:      212.04 pounds (96.38 kg) BMI:     38.92 Temp:     97 degrees F (36.11 degrees C) oral Pulse rate:   78 / minute BP sitting:   125 / 81  (right arm)  Vitals Entered By: Angelina Ok RN (November 10, 2009 10:06 AM)
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
## 2010-05-16 NOTE — Letter (Signed)
Summary: Work Excuse  [facility]  [address]   [place], [place] [address]   Phone: [phone]  Fax: [phone]    Today's Date: April 28, 2009  Name of Patient: Danielle Mason  The above named patient had a medical visit today at:  am / pm.  Please take this into consideration when reviewing the time away from work/school.    Special Instructions:  [  ] None  Arly.Keller ] To be off the remainder of today, returning to the normal work / school schedule tomorrow.  [  ] To be off until the next scheduled appointment on ______________________.  [  ] Other ________________________________________________________________ ________________________________________________________________________   Sincerely yours,   Julaine Fusi  DO

## 2010-05-16 NOTE — Assessment & Plan Note (Signed)
Summary: F/U,MC   Vital Signs:  Patient profile:   50 year old female BP sitting:   137 / 88  Vitals Entered By: Lillia Pauls CMA (March 13, 2010 3:05 PM)  Primary Care Provider:  Phillips Odor   History of Present Illness: Left wrist pain f/u  she had only a couple of days relief from the injection. She also had some skin hypopigmentation at site of injection and she has questions about that.  Current Medications (verified): 1)  Hydrochlorothiazide 25 Mg Tabs (Hydrochlorothiazide) .... Take 1 Tablet By Mouth Once A Day 2)  Tylenol Extra Strength 500 Mg  Tabs (Acetaminophen) .... One Tab Every 6 Hours As Needed For Headaches 3)  Pravachol 20 Mg Tabs (Pravastatin Sodium) .... Take 1 Tablet By Mouth Once A Day 4)  Ibuprofen 800 Mg Tabs (Ibuprofen) .... Take 1 Tablet By Mouth Two Times A Day 5)  Sulfamethoxazole-Tmp Ds 800-160 Mg Tabs (Sulfamethoxazole-Trimethoprim) .... Take 1 Tablet By Mouth Two Times A Day For 3 Days 6)  Phenazopyridine Hcl 100 Mg Tabs (Phenazopyridine Hcl) .... Take One Pill By Mouth Up To Three Times A Day As Needed For Painful Urination  Allergies: 1)  ! Penicillin 2)  ! Penicillin  Review of Systems  The patient denies fever.    Physical Exam  General:  alert and overweight-appearing.   Msk:  Left wrist mildy TTP  APL area but FROm and normal thumb strength.  SKIN: 3 mm x 6 mm hypopigmented area. 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 Take 1 tablet by mouth once a day 2)  Tylenol Extra Strength 500 Mg Tabs (Acetaminophen) .... One tab every 6 hours as needed for headaches 3)  Pravachol 20 Mg Tabs (Pravastatin sodium) .... Take 1  tablet by mouth once a day 4)  Ibuprofen 800 Mg Tabs (Ibuprofen) .... Take 1 tablet by mouth two times a day 5)  Sulfamethoxazole-tmp Ds 800-160 Mg Tabs (Sulfamethoxazole-trimethoprim) .... Take 1 tablet by mouth two times a day for 3 days 6)  Phenazopyridine Hcl 100 Mg Tabs (Phenazopyridine hcl) .... Take one pill by mouth up to three times a day as needed for painful urination   Orders Added: 1)  Est. Patient Level III [16109]

## 2010-05-16 NOTE — Progress Notes (Signed)
Summary: Colonoscopy  Phone Note From Other Clinic   Caller: Nurse Call For: Dr. Phillips Odor Summary of Call: Call from Dr. Marlane Hatcher office.  They have tried 4 times to schedule pt for a Colonoscopy.  Have left message and have not heard from pt.  Office will now send a letter to ask pt to call and to set up an appointment for the Colonoscopy. Angelina Ok RN  June 14, 2009 9:10 AM  Initial call taken by: Angelina Ok RN,  June 14, 2009 9:10 AM  Follow-up for Phone Call        Agree with plan- I know that at her last appointment she wanted to get this set up! Not sure what the disconnect is at this point.Will keep trying-thanks! Follow-up by: Julaine Fusi  DO,  June 23, 2009 7:04 PM
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
## 2010-05-16 NOTE — Assessment & Plan Note (Signed)
Summary: F/U/VS   Vital Signs:  Patient profile:   50 year old female Height:      62 inches (157.48 cm) Weight:      204 pounds (92.73 kg) BMI:     37.45 Temp:     96.6 degrees F (35.89 degrees C) oral Pulse rate:   86 / minute BP sitting:   132 / 82  (left arm) Cuff size:   regular  Vitals Entered By: Angelina Ok RN (June 02, 2009 8:49 AM) Is Patient Diabetic? No Pain Assessment Patient in pain? no      Nutritional Status BMI of > 30 = obese  Have you ever been in a relationship where you felt threatened, hurt or afraid?No   Does patient need assistance? Functional Status Self care Ambulation Normal Comments Physical.  Needs refills on meds.   Primary Care Provider:  Phillips Odor   History of Present Illness: Guy is in today for routine preventive care- PAP and refills. No new complaints. Shoulder continues to be painful, worse with lifting or overuse.  Depression History:      The patient denies a depressed mood most of the day and a diminished interest in her usual daily activities.        The patient denies that she feels like life is not worth living, denies that she wishes that she were dead, and denies that she has thought about ending her life.         Preventive Screening-Counseling & Management  Alcohol-Tobacco     Alcohol drinks/day: <1     Smoking Status: never  Allergies: 1)  ! Penicillin 2)  ! Penicillin  Physical Exam  General:  Well-developed,well-nourished,in no acute distress; alert,appropriate and cooperative throughout examination Neck:  No deformities, masses, or tenderness noted. Lungs:  Normal respiratory effort, chest expands symmetrically. Lungs are clear to auscultation, no crackles or wheezes. Heart:  Normal rate and regular rhythm. S1 and S2 normal without gallop, murmur, click, rub or other extra sounds. Abdomen:  Bowel sounds positive,abdomen soft and non-tender without masses, organomegaly or hernias  noted. Genitalia:  Normal introitus for age, no external lesions, no vaginal discharge, mucosa pink and moist, no vaginal or cervical lesions, no vaginal atrophy, no friaility or hemorrhage, normal uterus size and position, no adnexal masses or tenderness Msk:  No deformity or scoliosis noted of thoracic or lumbar spine.    Mildy limited ROM in left shoulder, trapezius muscle spasm noted. Extremities:  No clubbing, cyanosis, edema, or deformity noted     Impression & Recommendations:  Problem # 1:  HYPERTENSION (ICD-401.9)  Doing well on her medication. No change today.  Her updated medication list for this problem includes:    Hydrochlorothiazide 25 Mg Tabs (Hydrochlorothiazide) .Marland Kitchen... Take 1 tablet by mouth once a day  BP today: 132/82 Prior BP: 124/69 (04/28/2009)  Labs Reviewed: K+: 4.5 (04/28/2009) Creat: : 0.70 (04/28/2009)   Chol: 230 (04/28/2009)   HDL: 45 (04/28/2009)   LDL: 154 (04/28/2009)   TG: 153 (04/28/2009)  Problem # 2:  HYPERLIPIDEMIA (ICD-272.4)  Will start low dose statin today.  Her updated medication list for this problem includes:    Pravachol 20 Mg Tabs (Pravastatin sodium) .Marland Kitchen... Take 1 tablet by mouth once a day  Problem # 3:  Screening Cervical Cancer (ICD-V76.2) Pap today.  Complete Medication List: 1)  Hydrochlorothiazide 25 Mg Tabs (Hydrochlorothiazide) .... Take 1 tablet by mouth once a day 2)  Tylenol Extra Strength 500 Mg Tabs (Acetaminophen) .Marland KitchenMarland KitchenMarland Kitchen  One tab every 6 hours as needed for headaches 3)  Nasacort Aq 55 Mcg/act Aers (Triamcinolone acetonide(nasal)) .... Use 2 sprays in each nostril two times a day. 4)  Protonix 40 Mg Tbec (Pantoprazole sodium) .... Take 1 tablet by mouth once a day 5)  Pravachol 20 Mg Tabs (Pravastatin sodium) .... Take 1 tablet by mouth once a day 6)  Ibuprofen 800 Mg Tabs (Ibuprofen) .... Take 1 tablet by mouth two times a day  Other Orders: T-Pap Smear, Thin Prep ([id number])  Patient Instructions: 1)  Please schedule  a follow-up appointment in 2-3 months. Prescriptions: IBUPROFEN 800 MG TABS (IBUPROFEN) Take 1 tablet by mouth two times a day  #60 x 3   Entered and Authorized by:   Julaine Fusi  DO   Signed by:   Julaine Fusi  DO on 06/07/2009   Method used:   Electronically to        [facility] [address] (retail)       [phone]       [place], [place]  [address]       Ph: [phone]       Fax: [phone]   RxID:   [id number] PRAVACHOL 20 MG TABS (PRAVASTATIN SODIUM) Take 1 tablet by mouth once a day  #30 x 2   Entered and Authorized by:   Julaine Fusi  DO   Signed by:   Julaine Fusi  DO on 06/07/2009   Method used:   Electronically to        [facility] [address] (retail)       [phone]       [place], [place]  [address]       Ph: [phone]       Fax: [phone]   RxID:   [id number]   Prevention & Chronic Care Immunizations   Influenza vaccine: refuses  (02/03/2008)   Influenza vaccine deferral: Refused  (10/19/2008)    Tetanus booster: Not documented   Td booster deferral: Refused  (10/19/2008)    Pneumococcal vaccine: Not documented   Pneumococcal vaccine deferral: Refused  (10/19/2008)  Colorectal Screening   Hemoccult: Not documented    Colonoscopy: Not documented  Other Screening   Pap smear: Done.  (08/14/1997)    Mammogram: Not documented   Smoking status: never  (06/02/2009)  Lipids   Total Cholesterol: 230  (04/28/2009)   Lipid panel action/deferral: Lipid Panel ordered   LDL: 154  (04/28/2009)   LDL Direct: Not documented   HDL: 45  (04/28/2009)   Triglycerides: 153  (04/28/2009)    SGOT (AST): 16  (04/28/2009)   SGPT (ALT): 14  (04/28/2009)   Alkaline phosphatase: 63  (04/28/2009)   Total bilirubin: 0.4  (04/28/2009)  Hypertension   Last Blood Pressure: 132 / 82  (06/02/2009)   Serum creatinine: 0.70  (04/28/2009)   BMP action: Ordered   Serum potassium 4.5   (04/28/2009)  Self-Management Support :    Patient will work on the following items until the next clinic visit to reach self-care goals:     Medications and monitoring: take my medicines every day, bring all of my medications to every visit  (06/02/2009)     Eating: drink diet soda or water instead of juice or soda, eat more vegetables, eat foods that are low in salt, eat fruit for snacks and desserts  (06/02/2009)  Activity: take a 30 minute walk every day  (06/02/2009)    Hypertension self-management support: Not documented    Lipid self-management support: Not documented   Process Orders Check Orders Results:     [facility]: ABN not required for this insurance Tests Sent for requisitioning (June 07, 2009 2:53 PM):     06/02/2009: [facility] -- T-Pap Smear, Thin Prep [[id number]] (signed)

## 2010-05-16 NOTE — Letter (Signed)
Summary: [facility] PT Referral Form  [facility] PT Referral Form   Imported By: Marily Memos 07/04/2009 16:03:45  _____________________________________________________________________  External Attachment:    Type:   Image     Comment:   External Document
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
## 2010-05-16 NOTE — Miscellaneous (Signed)
Summary: PT eval: somnolence  Clinical Lists Changes Spoke w Becky from PT. Danielle Mason was seen for PT, seemed less than enthusiastic about restarting PT. Part of this is her $45 co-pay. She also said tehy had tried "all of these things" (posture training, scapular exercise, etc) at last go round of PT and it did not work. After speaking W Becky, we decided to d/c her from PT. I reviewed her MRI which showed no rotator cuff tear but some tendinopathy.  We have tried injection therapy with minimal result.  [facility] Note, becky said Danielle Engen was extremely somnolent during her PT eval--she actually fell asleep during grip testing. She told PT that she was not sleeping well at night. I had stareted her on gabapentin but even if she has tapered all of the way up she would only be on 300 mg. I will have my nurse call and ask about the gabapentin and if she is sleepier than usual. Also send this note to her PCP, Dr Phillips Odor.
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
## 2010-05-16 NOTE — Assessment & Plan Note (Signed)
Summary: F/U FOOT,MC   Vital Signs:  Patient profile:   50 year old female BP sitting:   136 / 93  Vitals Entered By: Lillia Pauls CMA (Aug 29, 2009 2:05 PM)  Primary Care Provider:  Phillips Odor   History of Present Illness: f/u left shouldr pain. No better really---she is frustrated. The gabapentin did not seem to help after about 4 days so she stopped it. Does not want to restart exercises either at hime or w PT for her rotator cuff. Says pain is now all down into her arm on left--even into her hand.  Allergies: 1)  ! Penicillin 2)  ! Penicillin  Review of Systems  The patient denies anorexia, fever, and weight loss.    Physical Exam  General:  alert and overweight-appearing.   Msk:  Shoulder are symmetrical and scapula move normally. Left arm is tender everywhere I touch it and she is also tender to any examination of forearm. FROm of B shulders although pain w supraspinatus testing. Very limited strength against resistance in any plane on left up-er arm exam.  Trapezius muscle has three areas of muscle spasm identified by plpation and this seems to cause some increased generalized left arm pain. distally she is neurovascularly intact. Additional Exam:  Patient given informed consent for injection. Discussed possible complications of infection, bleeding or skin atrophy at site of injection. Possible side effect of avascular necrosis (focal area of bone death) due to steroid use.Appropriate verbal time out taken Are cleaned and prepped in usual sterile fashion. A ---1- cc kennalog plus ---2-cc 1% lidocaine without epinephrine was injected into the--three trigger points oif the left trapezius muscle-. Patient tolerated procedure well with no complications.    Impression & Recommendations:  Problem # 1:  SHOULDER PAIN, LEFT (ICD-719.41)  shoulder pain tat does not seem to be related to her rotator cuff--I think it may be from trapezius spasm. we will try trigger point injection--I  have gven her 2 scap stabilizing exercises to do and f/u 33m. I do not think we will be able to get her completely  pain free as her pain is multifactorial and may have some component of fibromyalgia.  Orders: Trigger Point Injection (1 or 2 muscles) ([id number]) Kenalog 10 mg inj ([id number])  Complete Medication List: 1)  Hydrochlorothiazide 25 Mg Tabs (Hydrochlorothiazide) .... Take 1 tablet by mouth once a day 2)  Tylenol Extra Strength 500 Mg Tabs (Acetaminophen) .... One tab every 6 hours as needed for headaches 3)  Nasacort Aq 55 Mcg/act Aers (Triamcinolone acetonide(nasal)) .... Use 2 sprays in each nostril two times a day. 4)  Protonix 40 Mg Tbec (Pantoprazole sodium) .... Take 1 tablet by mouth once a day 5)  Pravachol 20 Mg Tabs (Pravastatin sodium) .... Take 1 tablet by mouth once a day 6)  Ibuprofen 800 Mg Tabs (Ibuprofen) .... Take 1 tablet by mouth two times a day 7)  Gabapentin 100 Mg Caps (Gabapentin) .... Taper up to thrr two times a day patient has written taper 8)  Hydrocodone-acetaminophen 5-500 Mg Tabs (Hydrocodone-acetaminophen) .... Take 1 tablet by mouth every 6 hours as needed for pain
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
## 2010-05-16 NOTE — Progress Notes (Signed)
    Summary of Call: Neeton please call her--phtysical therapy said she was falling asleep during their eval. I had started her on gabapentin. Ask 1) is she taking the 100 mg tabs---2) how many times a day---3) does she feel sleepier than usual and 4) does she have f/u appt w me Thanks!  Denny Levy MD  August 12, 2009 10:23 AM     talked with pt and she states she is taking the 100mg  tabs, 1 qam and 3 at bedtime. she is feeling a little sleeper than usual but that could also be due to her staying up help her mother with her health conditions. the therapist told her not to schedule another appt until they spoke with you and she does not have an appt with you. also, the vicodin dr Phillips Odor prescribed is not helping her pain at all. Lillia Pauls CMA  Aug 15, 2009 9:51 AM

## 2010-05-16 NOTE — Progress Notes (Signed)
Summary: refill/gg  Phone Note Refill Request  on July 25, 2009 5:10 PM  Refills Requested: Medication #1:  HYDROCHLOROTHIAZIDE 25 MG TABS Take 1 tablet by mouth once a day   Last Refilled: 05/04/2009  Method Requested: Electronic Initial call taken by: Merrie Roof RN,  July 25, 2009 5:11 PM  Follow-up for Phone Call        Refill approved-nurse to complete Follow-up by: Julaine Fusi  DO,  July 28, 2009 9:21 PM    Prescriptions: HYDROCHLOROTHIAZIDE 25 MG TABS (HYDROCHLOROTHIAZIDE) Take 1 tablet by mouth once a day  #30 x 11   Entered and Authorized by:   Julaine Fusi  DO   Signed by:   Julaine Fusi  DO on 07/28/2009   Method used:   Electronically to        [facility] [address] (retail)       [phone]       [place], [place]  [address]       Ph: [phone]       Fax: [phone]   RxID:   [id number]

## 2010-05-16 NOTE — Miscellaneous (Signed)
Summary: [employer]  [employer]   Imported By: Margie Billet 07/14/2009 11:41:37  _____________________________________________________________________  External Attachment:    Type:   Image     Comment:   External Document

## 2010-05-16 NOTE — Letter (Signed)
Summary: Generic Letter  [facility]  [address]   [place], [place] [address]   Phone: [phone]  Fax: [phone]    11/10/2009  RE: Danielle Mason [address] [place], [place]  [address]   Today's Date: April 28, 2009  Name of Patient: Danielle Mason  The above named patient had a medical visit today at:  am / pm.  Please take this into consideration when reviewing the time away from work/school.    Special Instructions:  [  ] None  Arly.Keller ] To be off the remainder of today, returning to the normal work / school schedule tomorrow.  [  ] To be off until the next scheduled appointment on ______________________.  [  ] Other ________________________________________________________________ ________________________________________________________________________   Sincerely yours,   Julaine Fusi  DO   Signed by Julaine Fusi  DO on 04/28/2009 at 11:25 AM Name correction on 11/10/09  Appended Document: Generic Letter
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
## 2010-05-16 NOTE — Consult Note (Signed)
Summary: [facility]   [facility]   Imported By: Margie Billet 12/15/2009 10:23:34  _____________________________________________________________________  External Attachment:    Type:   Image     Comment:   External Document

## 2010-05-16 NOTE — Letter (Signed)
Summary: [employer]  [employer]   Imported By: Shon Hough 11/18/2009 13:56:15  _____________________________________________________________________  External Attachment:    Type:   Image     Comment:   External Document

## 2010-05-16 NOTE — Letter (Signed)
Summary: Generic Letter  [facility]  [address]   [place], [place] [address]   Phone: [phone]  Fax: [phone]    11/11/2009 RE: Work Excuse for [facility] [address] [place], [place]  [address]  Dear Madam/Sir,  Today's Date: April 28, 2009    The above named patient had a medical visit today at:  am / pm.  Please take this into consideration when reviewing the time away from work/school.    Special Instructions:  [  ] None  Arly.Keller ] To be off the remainder of today, returning to the normal work / school schedule tomorrow.  [  ] To be off until the next scheduled appointment on ______________________.  [  ] Other ________________________________________________________________ ________________________________________________________________________   Sincerely yours,   Julaine Fusi  DO
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
## 2010-05-16 NOTE — Progress Notes (Signed)
Summary: Pain  Phone Note Call from Patient   Caller: Patient Call For: Julaine Fusi  DO Summary of Call: Call from pt said thta she has pain in her wrist and left shoulder.  The whole side is hurting.  Pain is at a level 10.  No type of pain medicine is working.  Is putting heat to it.   Has gone to Physical Therapy and  Sports Medicine.   York Spaniel that she was told by  Physical Therapy that she should have an MRI of the MRI  area.  Pt has an appointment next week wnats the MRI before that visit.  Angelina Ok RN  September 16, 2009 10:32 AM  Initial call taken by: Angelina Ok RN,  September 16, 2009 10:31 AM  Follow-up for Phone Call        The MRI cannot be ordered until she is evaluated. No way to get around that. I have already done an MRI of her left shoulder and neck- so if its teh left side I am not going to re-image that anyway. She can also been seen by sports med who has been helping with her shoudler and wrist pain. Did she have a new injury or anything like that? Follow-up by: Julaine Fusi  DO,  September 20, 2009 4:36 PM  Additional Follow-up for Phone Call Additional follow up Details #1::        Unable to reach pt at this time.  Will keep trying Merrie Roof RN  September 21, 2009 10:46 AM   I talked with pt and she states she has not had a new injury. Will see tomorrow for evaluation Additional Follow-up by: Merrie Roof RN,  September 21, 2009 11:16 AM
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
## 2010-05-18 NOTE — Letter (Signed)
Summary: [employer]   Imported By: Margie Billet 04/25/2010 15:22:40  _____________________________________________________________________  External Attachment:    Type:   Image     Comment:   External Document

## 2010-05-18 NOTE — Letter (Signed)
Summary: [employer]   Imported By: Marily Memos 04/06/2010 12:18:52  _____________________________________________________________________  External Attachment:    Type:   Image     Comment:   External Document
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
## 2010-05-19 NOTE — Letter (Signed)
Summary: [facility] PT referral form  [facility] PT referral form   Imported By: Marily Memos 07/26/2009 15:23:22  _____________________________________________________________________  External Attachment:    Type:   Image     Comment:   External Document

## 2010-05-24 NOTE — Letter (Signed)
Summary: Generic Letter  [facility]  [address]   [place], [place] [address]   Phone: [phone]  Fax: [phone]    05/16/2010  RE: Danielle Mason [address] [place], [place]  [address]  Dear Employer/Supervisor,  This letter is for [name]. It is my medical opinion that she should no longer continue to wear steel toe shoes at work due to a medical condition with her foot. Please let me know if you have any further questions. Thank you in advance for your assistance.     Sincerely,   Julaine Fusi  DO

## 2010-06-21 ENCOUNTER — Encounter: Payer: Self-pay | Admitting: *Deleted

## 2010-08-24 ENCOUNTER — Encounter: Payer: Self-pay | Admitting: Internal Medicine

## 2010-08-24 ENCOUNTER — Ambulatory Visit: Payer: Self-pay | Admitting: Internal Medicine

## 2010-08-24 ENCOUNTER — Ambulatory Visit (INDEPENDENT_AMBULATORY_CARE_PROVIDER_SITE_OTHER): Payer: BC Managed Care – PPO | Admitting: Internal Medicine

## 2010-08-24 DIAGNOSIS — M722 Plantar fascial fibromatosis: Secondary | ICD-10-CM

## 2010-08-24 DIAGNOSIS — M19019 Primary osteoarthritis, unspecified shoulder: Secondary | ICD-10-CM

## 2010-08-24 DIAGNOSIS — Z1231 Encounter for screening mammogram for malignant neoplasm of breast: Secondary | ICD-10-CM

## 2010-08-24 DIAGNOSIS — L309 Dermatitis, unspecified: Secondary | ICD-10-CM

## 2010-08-24 DIAGNOSIS — E785 Hyperlipidemia, unspecified: Secondary | ICD-10-CM

## 2010-08-24 DIAGNOSIS — F329 Major depressive disorder, single episode, unspecified: Secondary | ICD-10-CM

## 2010-08-24 DIAGNOSIS — L732 Hidradenitis suppurativa: Secondary | ICD-10-CM

## 2010-08-24 DIAGNOSIS — F3289 Other specified depressive episodes: Secondary | ICD-10-CM

## 2010-08-24 DIAGNOSIS — L259 Unspecified contact dermatitis, unspecified cause: Secondary | ICD-10-CM

## 2010-08-24 DIAGNOSIS — M25532 Pain in left wrist: Secondary | ICD-10-CM

## 2010-08-24 DIAGNOSIS — I10 Essential (primary) hypertension: Secondary | ICD-10-CM

## 2010-08-24 DIAGNOSIS — M25539 Pain in unspecified wrist: Secondary | ICD-10-CM

## 2010-08-24 MED ORDER — OLOPATADINE HCL 0.1 % OP SOLN: 1.0000 [drp] | Freq: Two times a day (BID) | OPHTHALMIC | Status: DC

## 2010-08-24 MED ORDER — MUPIROCIN CALCIUM 2 % EX CREA: TOPICAL_CREAM | CUTANEOUS | Status: DC

## 2010-08-24 MED ORDER — CLOTRIMAZOLE-BETAMETHASONE 1-0.05 % EX CREA: TOPICAL_CREAM | CUTANEOUS | Status: DC

## 2010-08-24 MED ORDER — CETIRIZINE HCL 10 MG PO TABS: 10.0000 mg | ORAL_TABLET | Freq: Every day | ORAL | Status: DC

## 2010-08-24 MED ORDER — MELOXICAM 7.5 MG PO TABS: 7.5000 mg | ORAL_TABLET | Freq: Every day | ORAL | Status: DC

## 2010-08-25 ENCOUNTER — Telehealth: Payer: Self-pay | Admitting: *Deleted

## 2010-08-25 ENCOUNTER — Ambulatory Visit (HOSPITAL_COMMUNITY): Payer: Self-pay

## 2010-08-25 NOTE — Telephone Encounter (Signed)
Pt scheduled for mammogram 08/25/2010 at [facility].  1`0:15 AM to arrive at 10:00 AM.

## 2010-09-01 NOTE — Group Therapy Note (Signed)
   NAME:  Danielle Mason, Danielle Mason                     ACCOUNT NO.:  [id number]   MEDICAL RECORD NO.:  [id number]                   PATIENT TYPE:  OUT   LOCATION:  [facility]                           FACILITY:  [facility]   PHYSICIAN:  Tinnie Gens, MD                     DATE OF BIRTH:  05/18/1960   DATE OF SERVICE:  12/10/2002                                    CLINIC NOTE   CHIEF COMPLAINT:  Annual examination.   HISTORY OF PRESENT ILLNESS:  The patient is a 50 year old G2, P2 who was  status post a TAH in January of 2003 for fibroid uterus.  She does have left  salpingectomy.  Both ovaries remain as well as a right tube.  She had a  wound infection postoperatively, but seems to be better now.  She comes in  today for an annual Pap smear.  She has no history of dysplasia, however.   PHYSICAL EXAMINATION:  VITAL SIGNS:  Blood pressure 130/79, weight 189,  pulse 88.  GENERAL:  She is a somewhat obese black female in no acute distress.  BREASTS:  No axillary or supraclavicular lymphadenopathy.  Breasts are  symmetric and without lesion or mass.  ABDOMEN:  Soft, nontender, nondistended.  PELVIC:  She has normal external female genitalia.  The vagina is well  rugated.  The cuff is well healed.  Pap smear is obtained.  Bimanual  examination reveals no mass at the cuff and no other pelvic masses are  appreciated.   IMPRESSION:  Annual examination.   PLAN:  1. Pap smear obtained today.  Likely will not need another since she has no     history of dysplasia and hysterectomy done for benign reasons __________     outcome of this one.  2. Schedule annual mammogram.                                               Tinnie Gens, MD    TP/MEDQ  D:  12/10/2002  T:  12/11/2002  Job:  [id number]

## 2010-09-01 NOTE — Op Note (Signed)
[facility]  Patient:    Danielle Mason, Danielle Mason [facility] Visit Number: [id number] MRN: [id number]          Service Type: GYN Location: [id number] Attending Physician:  Tammi Sou Dictated by:   Bing Neighbors Clearance Coots, M.D. Proc. Date: 04/28/01 Admit Date:  04/28/2001   CC:         [facility].   Operative Report  PREOPERATIVE DIAGNOSIS:       Symptomatic uterine fibroids.  POSTOPERATIVE DIAGNOSES:      1. Symptomatic uterine fibroids.                               2. Left hydrosalpinx.                               3. Pelvic adhesions.  PROCEDURES:                   1. Total abdominal hysterectomy.                               2. Lysis of adhesions.                               3. Left salpingectomy.  SURGEON:                      Charles A. Clearance Coots, M.D.  ASSISTANT:                    Roseanna Rainbow, M.D.  ANESTHESIA:                   General.  ESTIMATED BLOOD LOSS:         600 ml.  IV FLUIDS:                    1800 ml.  URINE OUTPUT:                 100 ml clear.  COMPLICATIONS:                None.  DRAINS:                       Foley to gravity.  SPECIMENS:                    1. Uterus with cervix.                               2. Left fallopian tube.  FINDINGS:                     Multiple uterine fibroids.  Multiple pelvic adhesions between the uterus and peritoneum of the urinary bladder.  DESCRIPTION OF PROCEDURE:     The patient was brought to the operating room. After satisfactory general endotracheal anesthesia, the abdomen was prepped and draped in the usual sterile fashion.  A Pfannenstiel skin incision was made through the previous scar with a scalpel down to the fascia.  The fascia was nicked in the midline and the fascial incision was extended to the left and to the  right with curved Mayo scissors.  The superior and inferior fascial edges were taken off of the rectus muscles  with blunt and sharp dissection. The rectus muscle was divided in the midline superiorly and inferiorly, being careful to avoid the urinary bladder inferiorly.  The peritoneum was entered digitally and was digitally extended to the left and to the right and was sharply dissected inferiorly down toward the urinary bladder, being careful to avoid the urinary bladder.  A large uterus of approximately 16 weeks in size was then observed.  The uterus was exteriorized.  There were multiple adhesions between the urinary bladder peritoneum and the lower uterine segment and round ligament on the left side.  These adhesions were taken down systematically with the Bovie and with sharp dissection until the urinary bladder could be completely free from the lower uterine segment and was dissected downward toward the cervical end of the uterus.  The round ligament on the left side was transected with the Bovie and a peritoneal rent was made with digital pressure along the broad ligament medial to the uterus. Parametrial clamps were then placed across the fallopian tube and suspensory ligament of the ovary and broad ligament.  The previous clamp that had been placed across the cornual end of the fallopian tube and suspensory ligament was then moved down to the opening that was created in the broad ligament above the bottom clamp.  A free tie was then placed beneath the clamp and was secured tightly.  A transfixion suture of 0 Vicryl was then placed above the knot.  The same procedure was performed on the opposite side.  The uterine arteries were then skeletonized bilaterally and were transected and suture ligated with a transfixion suture of 0 Vicryl.  The cardinal ligaments were then serially clamped, transected and suture ligated bilaterally down to the uterosacral ligaments.  The uterosacral ligament on the right was clamped, transected and suture ligated with a transfixion suture of 0 Vicryl and  the suture was held with a hemostat for later identification.  The same procedure was performed on the opposite side.  Curved parametrial clamps were then placed across the vagina bilaterally.  The cervix was removed.  The uterus had been amputated after the uterine vessels were ligated.  The two specimens were then submitted to pathology for evaluation.  The vaginal cuff was closed bilaterally with transfixion sutures of 0 Vicryl.  The center of the vaginal cuff was closed with figure-of-eight sutures of 0 Vicryl.  Hemostasis was excellent.  The pelvic cavity was then thoroughly irrigated with warm saline solution and all clots were removed.  Closure of the vaginal cuff and the peritoneal surfaces were observed for hemostasis and there was no active bleeding noted.  All packing was then removed and the abdomen was then closed as follows.  The rectus muscle was approximated with interrupted sutures of 2-0 Monocryl.  The fascia was closed with continuous suture of 0 PDS from each corner to the center.  The subcutaneous tissue was thoroughly irrigated with warm saline solution.  All areas of subcutaneous bleeding were coagulated with the Bovie.  The skin was then approximated with surgical stainless steel staples.  A sterile pressure bandage was applied to the incision closure.  The surgical technician indicated that all sponge, needle and instrument counts were correct.  The patient tolerated the procedure well and was transported to the recovery room in satisfactory condition. Dictated by:   Bing Neighbors Clearance Coots, M.D. Attending Physician:  Tammi Sou  DD:  04/28/01 TD:  04/28/01 Job: [id number]
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
## 2010-10-06 ENCOUNTER — Encounter: Payer: Self-pay | Admitting: Internal Medicine

## 2010-10-10 ENCOUNTER — Emergency Department (HOSPITAL_COMMUNITY)
Admission: EM | Admit: 2010-10-10 | Discharge: 2010-10-10 | Disposition: A | Discharge status: Home / Self Care | Payer: BC Managed Care – PPO | Attending: Emergency Medicine | Admitting: Emergency Medicine

## 2010-10-10 ENCOUNTER — Emergency Department (HOSPITAL_COMMUNITY): Admission: EM | Admit: 2010-10-10 | Payer: Self-pay | Source: Home / Self Care

## 2010-10-10 ENCOUNTER — Encounter (HOSPITAL_COMMUNITY): Payer: Self-pay | Admitting: Radiology

## 2010-10-10 ENCOUNTER — Emergency Department (HOSPITAL_COMMUNITY): Payer: BC Managed Care – PPO

## 2010-10-10 DIAGNOSIS — I10 Essential (primary) hypertension: Secondary | ICD-10-CM | POA: Insufficient documentation

## 2010-10-10 DIAGNOSIS — K5900 Constipation, unspecified: Secondary | ICD-10-CM | POA: Insufficient documentation

## 2010-10-10 DIAGNOSIS — Z90710 Acquired absence of both cervix and uterus: Secondary | ICD-10-CM | POA: Insufficient documentation

## 2010-10-10 DIAGNOSIS — R1032 Left lower quadrant pain: Secondary | ICD-10-CM | POA: Insufficient documentation

## 2010-10-10 HISTORY — DX: Essential (primary) hypertension: I10

## 2010-10-10 LAB — CBC
HCT: 37.7 % (ref 36.0–46.0)
Hemoglobin: 12.3 g/dL (ref 12.0–15.0)
MCH: 27.6 pg (ref 26.0–34.0)
MCHC: 32.6 g/dL (ref 30.0–36.0)
RDW: 14.6 % (ref 11.5–15.5)

## 2010-10-10 LAB — URINE MICROSCOPIC-ADD ON

## 2010-10-10 LAB — LIPASE, BLOOD: Lipase: 17 U/L (ref 11–59)

## 2010-10-10 LAB — DIFFERENTIAL
Basophils Relative: 0 % (ref 0–1)
Eosinophils Relative: 2 % (ref 0–5)
Monocytes Absolute: 0.5 10*3/uL (ref 0.1–1.0)
Monocytes Relative: 5 % (ref 3–12)
Neutro Abs: 5.4 10*3/uL (ref 1.7–7.7)

## 2010-10-10 LAB — COMPREHENSIVE METABOLIC PANEL
BUN: 14 mg/dL (ref 6–23)
Calcium: 10 mg/dL (ref 8.4–10.5)
GFR calc Af Amer: >60 mL/min (ref >60)
Glucose, Bld: 94 mg/dL (ref 70–99)
Sodium: 139 mEq/L (ref 135–145)
Total Protein: 7.5 g/dL (ref 6.0–8.3)

## 2010-10-10 LAB — URINALYSIS, ROUTINE W REFLEX MICROSCOPIC
Glucose, UA: NEGATIVE mg/dL
Hgb urine dipstick: NEGATIVE
Specific Gravity, Urine: 1.023 (ref 1.005–1.030)
pH: 6 (ref 5.0–8.0)

## 2010-10-10 MED ORDER — IOHEXOL 300 MG/ML  SOLN
100.0000 mL | Freq: Once | INTRAMUSCULAR | Status: AC | PRN
  Administered 2010-10-10: 100 mL via INTRAVENOUS

## 2010-11-09 ENCOUNTER — Encounter: Payer: Self-pay | Admitting: Internal Medicine

## 2010-11-09 ENCOUNTER — Ambulatory Visit (INDEPENDENT_AMBULATORY_CARE_PROVIDER_SITE_OTHER): Payer: BC Managed Care – PPO | Admitting: Internal Medicine

## 2010-11-09 DIAGNOSIS — R635 Abnormal weight gain: Secondary | ICD-10-CM

## 2010-11-09 DIAGNOSIS — L2089 Other atopic dermatitis: Secondary | ICD-10-CM

## 2010-11-09 DIAGNOSIS — M722 Plantar fascial fibromatosis: Secondary | ICD-10-CM

## 2010-11-09 DIAGNOSIS — E669 Obesity, unspecified: Secondary | ICD-10-CM

## 2010-11-09 DIAGNOSIS — L259 Unspecified contact dermatitis, unspecified cause: Secondary | ICD-10-CM

## 2010-11-09 DIAGNOSIS — L309 Dermatitis, unspecified: Secondary | ICD-10-CM

## 2010-11-09 DIAGNOSIS — L209 Atopic dermatitis, unspecified: Secondary | ICD-10-CM

## 2010-11-09 DIAGNOSIS — R300 Dysuria: Secondary | ICD-10-CM

## 2010-11-09 DIAGNOSIS — H109 Unspecified conjunctivitis: Secondary | ICD-10-CM

## 2010-11-09 LAB — CBC WITH DIFFERENTIAL/PLATELET
Basophils Relative: 1 % (ref 0–1)
Eosinophils Absolute: 0.3 10*3/uL (ref 0.0–0.7)
MCH: 27.6 pg (ref 26.0–34.0)
MCHC: 31.5 g/dL (ref 30.0–36.0)
Monocytes Relative: 5 % (ref 3–12)
Neutrophils Relative %: 61 % (ref 43–77)
Platelets: 317 10*3/uL (ref 150–400)

## 2010-11-09 LAB — TSH: TSH: 0.72 u[IU]/mL (ref 0.350–4.500)

## 2010-11-09 LAB — COMPREHENSIVE METABOLIC PANEL
ALT: 16 U/L (ref 0–35)
AST: 16 U/L (ref 0–37)
Albumin: 4.2 g/dL (ref 3.5–5.2)
Alkaline Phosphatase: 72 U/L (ref 39–117)
Potassium: 4.2 mEq/L (ref 3.5–5.3)
Sodium: 138 mEq/L (ref 135–145)
Total Protein: 7.3 g/dL (ref 6.0–8.3)

## 2010-11-09 LAB — URINALYSIS
Hgb urine dipstick: NEGATIVE
Ketones, ur: NEGATIVE mg/dL
Nitrite: NEGATIVE
pH: 5 (ref 5.0–8.0)

## 2010-11-09 MED ORDER — METHYLPREDNISOLONE SODIUM SUCC 125 MG IJ SOLR
125.0000 mg | Freq: Once | INTRAMUSCULAR | Status: AC
  Administered 2010-11-09: 125 mg via INTRAMUSCULAR

## 2010-11-09 MED ORDER — DIPHENHYDRAMINE HCL 25 MG PO CAPS: 25.0000 mg | ORAL_CAPSULE | ORAL | Status: DC | PRN

## 2010-11-09 MED ORDER — AQUAPHOR EX OINT: TOPICAL_OINTMENT | CUTANEOUS | Status: DC | PRN

## 2010-11-09 MED ORDER — PREDNISONE (PAK) 10 MG PO TABS: ORAL_TABLET | ORAL | Status: DC

## 2010-11-09 MED ORDER — TOBRAMYCIN-DEXAMETHASONE 0.3-0.1 % OP SUSP: 1.0000 [drp] | OPHTHALMIC | Status: AC

## 2010-11-09 NOTE — Progress Notes (Signed)
  Subjective:    Patient ID: Silverio Decamp, female    DOB: [birth year], 50 y.o.   MRN: [id number]  HPI  Julious Oka continues to struggle with shoulder pain. Pain is worse in the AM. Improves with IBP. Associated neck pain with muscle spasm. Complains of fatigue, low energy, poor sleep. New left foot pain, very severe in the AM, very tender on plantar surface  Review of Systems  Constitutional: Positive for fatigue. Negative for fever, chills and unexpected weight change.  HENT: Positive for neck pain and neck stiffness. Negative for hearing loss, ear pain, facial swelling and ear discharge.   Respiratory: Negative for chest tightness and shortness of breath.   Cardiovascular: Negative for chest pain.  Musculoskeletal: Positive for arthralgias. Negative for joint swelling and gait problem.  Psychiatric/Behavioral: Negative for dysphoric mood and agitation.       Objective:   Physical Exam        Assessment & Plan:

## 2010-11-28 DIAGNOSIS — H109 Unspecified conjunctivitis: Secondary | ICD-10-CM | POA: Insufficient documentation

## 2010-11-28 DIAGNOSIS — L309 Dermatitis, unspecified: Secondary | ICD-10-CM | POA: Insufficient documentation

## 2010-11-28 NOTE — Assessment & Plan Note (Signed)
Chronic plantar pain, I offered to inject her foot. She will continue to try conservative measures for now.

## 2010-11-28 NOTE — Assessment & Plan Note (Addendum)
Chronic shoulder pain L>R. Using NSAIDS and Flexeril almost daily. I dicontinued her neurontin because it was causing severe daytime sleepiness.

## 2010-11-28 NOTE — Assessment & Plan Note (Signed)
New dysuria, may also be related to skin irritation. Will check UA.

## 2010-11-28 NOTE — Assessment & Plan Note (Signed)
Evidence of some severe erythema with corneal edema-may be inflammatory from her eczema flare as well, will give her short course of Tobradex.

## 2010-11-28 NOTE — Assessment & Plan Note (Signed)
Restarted her BP medication today. Had not been taking. Discussed importance of BP/Fluid pill.
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
## 2010-11-28 NOTE — Assessment & Plan Note (Signed)
Intensely purritic and given wide distribution I will prescribe a prednisone taper. I advised her to use hypoallergenic products only. Aquaphor for lotion, no fragrances or scented soaps. If not better needs to come in for re-evaluation. She has a history of severe childhood eczema.

## 2010-11-28 NOTE — Assessment & Plan Note (Signed)
Recurrent boils and skin infections. I discussed early treatment with warm compresses and if the becomes swollen and hard to take a course of doxycycline and notify the office in not improved.

## 2010-12-19 ENCOUNTER — Telehealth: Payer: Self-pay | Admitting: *Deleted

## 2010-12-19 NOTE — Telephone Encounter (Signed)
Call from pt requesting lab results and inquiring about an appt to dermatology. Pt informed that I will speak to her pcp and call her back.Danielle Mason, Danielle Belding Cassady9/4/20129:24 AM

## 2010-12-20 ENCOUNTER — Encounter: Payer: Self-pay | Admitting: *Deleted

## 2011-01-04 ENCOUNTER — Encounter: Payer: Self-pay | Admitting: Internal Medicine

## 2011-01-04 ENCOUNTER — Ambulatory Visit (INDEPENDENT_AMBULATORY_CARE_PROVIDER_SITE_OTHER): Payer: BC Managed Care – PPO | Admitting: Internal Medicine

## 2011-01-04 VITALS — BP 128/85 | HR 89 | Temp 97.3°F | Ht 62.0 in | Wt 219.4 lb

## 2011-01-04 DIAGNOSIS — L259 Unspecified contact dermatitis, unspecified cause: Secondary | ICD-10-CM

## 2011-01-04 DIAGNOSIS — L309 Dermatitis, unspecified: Secondary | ICD-10-CM

## 2011-01-04 MED ORDER — METHYLPREDNISOLONE SODIUM SUCC 125 MG IJ SOLR: 125.0000 mg | Freq: Four times a day (QID) | INTRAMUSCULAR | Status: DC

## 2011-01-04 MED ORDER — AMMONIUM LACTATE 10 % EX LOTN: 1.0000 "application " | TOPICAL_LOTION | Freq: Two times a day (BID) | CUTANEOUS | Status: DC

## 2011-01-04 MED ORDER — PREDNISONE (PAK) 10 MG PO TABS: ORAL_TABLET | ORAL | Status: DC

## 2011-01-04 MED ORDER — METHYLPREDNISOLONE SODIUM SUCC 125 MG IJ SOLR
125.0000 mg | Freq: Once | INTRAMUSCULAR | Status: AC
  Administered 2011-01-04: 125 mg via INTRAMUSCULAR

## 2011-01-04 MED ORDER — HYDROXYZINE HCL 10 MG PO TABS: 10.0000 mg | ORAL_TABLET | Freq: Three times a day (TID) | ORAL | Status: AC | PRN

## 2011-01-04 NOTE — Progress Notes (Signed)
  Subjective:    Patient ID: Danielle Mason, female    DOB: 1961/03/09, 50 y.o.   MRN: [id number]  HPI Danielle Mason comes in with continued severe rash, intensely puritic. She has a history of eczema, severe in childhood, has noted very dry skin. She has changed her detergents and soap to "free and clear products".    Review of Systems     Objective:   Physical Exam  Papular rash covering entire body-mild on legs-very bad on face and arm, palms and soles spared, no MM involvement. Very dry skin, excoriations.       Assessment & Plan:  Severe Eczema: Will give shot of solumedrol today. Start prednisone taper and prescribe Am-Lactin lotion. She is scheduled to see allergist on Tuesday. I educated her on importance of moisturizing skin and taking complete course of steroids. Lab work done at previous visit indicated very high IgE. All other labs normal.

## 2011-01-04 NOTE — Assessment & Plan Note (Signed)
Severe Eczema. Prednisone. Topical moisturizers. Allergy Referral.

## 2011-01-05 ENCOUNTER — Telehealth: Payer: Self-pay | Admitting: *Deleted

## 2011-01-05 NOTE — Telephone Encounter (Signed)
Yes

## 2011-01-05 NOTE — Telephone Encounter (Signed)
Fax from pharmacy Ammonium Lactate 10 % ordered pharmacy only has 12 % is this ok to dispense?

## 2011-01-08 LAB — CBC
HCT: 36.2
Hemoglobin: 12.3
MCHC: 34
MCV: 86.8
RDW: 14

## 2011-01-08 LAB — I-STAT 8, (EC8 V) (CONVERTED LAB)
BUN: 19
Chloride: 107
Glucose, Bld: 96
Hemoglobin: 13.3
Potassium: 3.9
pH, Ven: 7.338 — ABNORMAL HIGH

## 2011-01-08 LAB — POCT I-STAT CREATININE: Operator id: 270651

## 2011-01-08 LAB — DIFFERENTIAL
Basophils Absolute: 0
Basophils Relative: 1
Eosinophils Absolute: 0.2
Eosinophils Relative: 2
Monocytes Absolute: 0.6
Neutro Abs: 5.1

## 2011-01-09 ENCOUNTER — Ambulatory Visit
Admission: RE | Admit: 2011-01-09 | Discharge: 2011-01-09 | Disposition: A | Discharge status: Home / Self Care | Payer: BC Managed Care – PPO | Source: Ambulatory Visit | Attending: Allergy and Immunology | Admitting: Allergy and Immunology

## 2011-01-09 ENCOUNTER — Other Ambulatory Visit: Payer: Self-pay | Admitting: Allergy and Immunology

## 2011-01-09 DIAGNOSIS — R05 Cough: Secondary | ICD-10-CM

## 2011-01-19 LAB — URINE MICROSCOPIC-ADD ON

## 2011-01-19 LAB — POCT PREGNANCY, URINE: Preg Test, Ur: NEGATIVE

## 2011-01-19 LAB — URINE CULTURE
Colony Count: NO GROWTH
Culture: NO GROWTH

## 2011-01-19 LAB — URINALYSIS, ROUTINE W REFLEX MICROSCOPIC
Ketones, ur: NEGATIVE mg/dL
Protein, ur: NEGATIVE mg/dL
Urobilinogen, UA: 1 mg/dL (ref 0.0–1.0)

## 2011-03-14 ENCOUNTER — Encounter: Payer: Self-pay | Admitting: Internal Medicine

## 2011-03-14 ENCOUNTER — Ambulatory Visit (INDEPENDENT_AMBULATORY_CARE_PROVIDER_SITE_OTHER): Payer: BC Managed Care – PPO | Admitting: Internal Medicine

## 2011-03-14 VITALS — BP 122/84 | HR 82 | Temp 96.1°F | Ht 61.0 in | Wt 221.3 lb

## 2011-03-14 DIAGNOSIS — Z139 Encounter for screening, unspecified: Secondary | ICD-10-CM

## 2011-03-14 DIAGNOSIS — M722 Plantar fascial fibromatosis: Secondary | ICD-10-CM

## 2011-03-14 DIAGNOSIS — M25519 Pain in unspecified shoulder: Secondary | ICD-10-CM

## 2011-03-14 MED ORDER — IBUPROFEN 800 MG PO TABS: 800.0000 mg | ORAL_TABLET | Freq: Two times a day (BID) | ORAL | Status: DC

## 2011-03-14 MED ORDER — MONTELUKAST SODIUM 10 MG PO TABS: 10.0000 mg | ORAL_TABLET | Freq: Every day | ORAL | Status: DC

## 2011-03-14 MED ORDER — MOMETASONE FUROATE 0.1 % EX OINT: TOPICAL_OINTMENT | Freq: Every day | CUTANEOUS | Status: DC

## 2011-03-14 MED ORDER — TERBINAFINE HCL 250 MG PO TABS: 250.0000 mg | ORAL_TABLET | Freq: Every day | ORAL | Status: DC

## 2011-03-14 MED ORDER — OMEPRAZOLE 20 MG PO CPDR: 20.0000 mg | DELAYED_RELEASE_CAPSULE | Freq: Every day | ORAL | Status: DC

## 2011-03-20 ENCOUNTER — Encounter: Payer: Self-pay | Admitting: Internal Medicine

## 2011-03-21 ENCOUNTER — Encounter: Payer: Self-pay | Admitting: Internal Medicine

## 2011-03-27 ENCOUNTER — Emergency Department (HOSPITAL_COMMUNITY)
Admission: EM | Admit: 2011-03-27 | Discharge: 2011-03-27 | Disposition: A | Discharge status: Home / Self Care | Payer: BC Managed Care – PPO | Attending: Emergency Medicine | Admitting: Emergency Medicine

## 2011-03-27 ENCOUNTER — Encounter (HOSPITAL_COMMUNITY): Payer: Self-pay | Admitting: Emergency Medicine

## 2011-03-27 DIAGNOSIS — F4541 Pain disorder exclusively related to psychological factors: Secondary | ICD-10-CM

## 2011-03-27 DIAGNOSIS — R609 Edema, unspecified: Secondary | ICD-10-CM

## 2011-03-27 DIAGNOSIS — G44209 Tension-type headache, unspecified, not intractable: Secondary | ICD-10-CM | POA: Insufficient documentation

## 2011-03-27 MED ORDER — KETOROLAC TROMETHAMINE 60 MG/2ML IM SOLN
60.0000 mg | Freq: Once | INTRAMUSCULAR | Status: AC
  Administered 2011-03-27: 60 mg via INTRAMUSCULAR
  Filled 2011-03-27: qty 2

## 2011-03-27 MED ORDER — TRAMADOL HCL 50 MG PO TABS: 50.0000 mg | ORAL_TABLET | Freq: Four times a day (QID) | ORAL | Status: AC | PRN

## 2011-03-27 NOTE — ED Provider Notes (Signed)
Medical screening examination/treatment/procedure(s) were performed by non-physician practitioner and as supervising physician I was immediately available for consultation/collaboration.  Juliet Rude. Rubin Payor, MD 03/27/11 1529

## 2011-03-27 NOTE — ED Notes (Signed)
Bilateral foot pain "when i am up on them" on and off for longer than 1 month. Headache posterior toward top of head started last night.

## 2011-03-27 NOTE — ED Provider Notes (Signed)
History     CSN: [id number] Arrival date & time: 03/27/2011  9:19 AM   First MD Initiated Contact with Patient 03/27/11 1024      Chief Complaint  Patient presents with  . Headache  . Foot Pain    bilateral foot pain     (Consider location/radiation/quality/duration/timing/severity/associated sxs/prior treatment) HPI  Pt presnts to the ED with complaints of a headache due to stress at work and her feet being sore after she is up on them for a long time on and off over the past month. She says that after being on her feet for a long time her feet start to swell. If she raises her feet then the swelling and pain goes away. Pt denies neck soreness, fevers, CP, SOB, myalgias, sore throat, chills, N/V/D.  Past Medical History  Diagnosis Date  . Hypertension     Past Surgical History  Procedure Date  . Abdominal hysterectomy   . Back surgery     No family history on file.  History  Substance Use Topics  . Smoking status: Never Smoker   . Smokeless tobacco: Not on file  . Alcohol Use: No    OB History    Grav Para Term Preterm Abortions TAB SAB Ect Mult Living                  Review of Systems  All other systems reviewed and are negative.    Allergies  Fish-derived products; Penicillins; and Latex  Home Medications   Current Outpatient Rx  Name Route Sig Dispense Refill  . AMMONIUM LACTATE 10 % EX LOTN Apply externally Apply 1 application topically 2 (two) times daily. 1 Bottle 3  . ASPIRIN EC 325 MG PO TBEC Oral Take 325 mg by mouth daily.      Marland Kitchen CALCIUM CARBONATE-VITAMIN D 600-400 MG-UNIT PO TABS Oral Take 1 tablet by mouth daily.     Marland Kitchen HYDROCHLOROTHIAZIDE 25 MG PO TABS Oral Take 12.5 mg by mouth every morning.     . IBUPROFEN 800 MG PO TABS Oral Take 1 tablet (800 mg total) by mouth 2 (two) times daily. 90 tablet 6  . DEXILANT 60 MG PO CPDR Oral Take 60 mg by mouth daily. New medication, has not obtained from pharmacy    . HYDROXYZINE HCL 10 MG PO TABS  Oral Take 10 mg by mouth every 4 (four) hours as needed. For itching New medication, has not started    . MONTELUKAST SODIUM 10 MG PO TABS Oral Take 10 mg by mouth daily. New medication, has not obtained from pharmacy     . OMEPRAZOLE 20 MG PO CPDR Oral Take 20 mg by mouth daily. New medication, has not obtained from pharmacy     . TERBINAFINE HCL 250 MG PO TABS Oral Take 250 mg by mouth daily. New medication, has not obtained from pharmacy       BP 121/76  Pulse 76  Resp 18  Ht 5\' 3"  (1.6 m)  Wt 221 lb (100.245 kg)  BMI 39.15 kg/m2  SpO2 98%  Physical Exam  Constitutional: She appears well-developed and well-nourished.  HENT:  Head: Normocephalic and atraumatic.  Eyes: Conjunctivae are normal. Pupils are equal, round, and reactive to light.  Neck: Trachea normal, normal range of motion and full passive range of motion without pain. Neck supple.  Cardiovascular: Normal rate, regular rhythm and normal pulses.   Pulmonary/Chest: Effort normal and breath sounds normal. Chest wall is not dull to percussion.  She exhibits no tenderness, no crepitus, no edema, no deformity and no retraction.  Abdominal: Soft. Normal appearance and bowel sounds are normal.  Musculoskeletal: Normal range of motion.  Neurological: She is alert. She has normal strength.  Skin: Skin is warm, dry and intact.  Psychiatric: She has a normal mood and affect. Her speech is normal and behavior is normal. Judgment and thought content normal. Cognition and memory are normal.    ED Course  Procedures (including critical care time)  Labs Reviewed - No data to display No results found.   No diagnosis found.    MDM  Will have patient buy some graduated stockings to use when up on feet to help prevent swelling so that she does not get the pain. Also, discussed some stress reducing techniques with pt. Wil give referral to Ortho        Dorthula Matas, PA 03/27/11 1245

## 2011-03-28 ENCOUNTER — Telehealth: Payer: Self-pay | Admitting: *Deleted

## 2011-03-28 ENCOUNTER — Other Ambulatory Visit: Payer: Self-pay | Admitting: Internal Medicine

## 2011-03-28 ENCOUNTER — Ambulatory Visit (AMBULATORY_SURGERY_CENTER): Payer: BC Managed Care – PPO | Admitting: *Deleted

## 2011-03-28 VITALS — Ht 62.0 in | Wt 219.1 lb

## 2011-03-28 DIAGNOSIS — Z1211 Encounter for screening for malignant neoplasm of colon: Secondary | ICD-10-CM

## 2011-03-28 MED ORDER — PEG-KCL-NACL-NASULF-NA ASC-C 100 G PO SOLR: ORAL | Status: DC

## 2011-03-28 NOTE — Progress Notes (Signed)
Pt had colonoscopy 2 years ago at [facility].  Pt states that they were unable to complete procedure because she was not cleaned out.  Release of information form signed and given to June McMurray, CMA.  Colonoscopy scheduled for 04/06/2011.

## 2011-03-28 NOTE — Telephone Encounter (Signed)
Dr Rhea Belton:  Pt here today for PV for screening colonoscopy scheduled for 12/ 21/2012.  Pt is new to [facility].  Previous colonoscopy in [facility] 2 years ago.  She says MD was unable to complete procedure due to not being cleaned out.  We will obtain those records.  I have given instructions for 2 day prep:  2 days before procedure: clear liquids and Magnesium Citrate at 6:00 pm.  Day before procedure: clear liquids and standard split does MoviPrep.  Please let me know if you would like any changes to prep.  Thanks, Ezra Sites

## 2011-03-29 ENCOUNTER — Encounter: Payer: Self-pay | Admitting: *Deleted

## 2011-03-29 NOTE — Telephone Encounter (Signed)
This sounds like a good plan She should call us, however, if not getting good results. Thanks

## 2011-03-29 NOTE — Telephone Encounter (Signed)
error 

## 2011-03-30 ENCOUNTER — Telehealth: Payer: Self-pay

## 2011-03-30 NOTE — Telephone Encounter (Signed)
Left message to call previsit rm 50 back.  Dr Brock Bad prep.

## 2011-04-02 NOTE — Telephone Encounter (Signed)
Pt was instructed in PV to call if she is not having good results from prep.  Ezra Sites

## 2011-04-04 ENCOUNTER — Telehealth: Payer: Self-pay | Admitting: *Deleted

## 2011-04-04 ENCOUNTER — Telehealth: Payer: Self-pay | Admitting: Internal Medicine

## 2011-04-04 NOTE — Telephone Encounter (Signed)
Pt called, asking if she could drink apple juice with her clear liquid diet.  Pt told she could

## 2011-04-04 NOTE — Telephone Encounter (Signed)
Pt. Advised the tooth extraction tomorrow will not interfere with procedure on Friday

## 2011-04-06 ENCOUNTER — Encounter: Payer: Self-pay | Admitting: Internal Medicine

## 2011-04-06 ENCOUNTER — Ambulatory Visit (AMBULATORY_SURGERY_CENTER): Payer: BC Managed Care – PPO | Admitting: Internal Medicine

## 2011-04-06 VITALS — BP 115/77 | HR 82 | Temp 96.7°F | Resp 20 | Ht 62.0 in | Wt 219.0 lb

## 2011-04-06 DIAGNOSIS — D126 Benign neoplasm of colon, unspecified: Secondary | ICD-10-CM

## 2011-04-06 DIAGNOSIS — Z1211 Encounter for screening for malignant neoplasm of colon: Secondary | ICD-10-CM

## 2011-04-06 LAB — GLUCOSE, CAPILLARY: Glucose-Capillary: 124 mg/dL — ABNORMAL HIGH (ref 70–99)

## 2011-04-06 MED ORDER — SODIUM CHLORIDE 0.9 % IV SOLN: 500.0000 mL | INTRAVENOUS | Status: DC

## 2011-04-06 NOTE — Progress Notes (Signed)
Patient did not experience any of the following events: a burn prior to discharge; a fall within the facility; wrong site/side/patient/procedure/implant event; or a hospital transfer or hospital admission upon discharge from the facility. (G8907) Patient did not have preoperative order for IV antibiotic SSI prophylaxis. (G8918)  

## 2011-04-06 NOTE — Patient Instructions (Signed)
Please review discharge instructions (blue and green sheets)  Await pathology  Resume your normal medications 

## 2011-04-06 NOTE — Op Note (Signed)
[facility] [address] [place], [place]  [address]  COLONOSCOPY PROCEDURE REPORT  PATIENT:  Jemina, Scahill  MR#:  [id number] BIRTHDATE:  1960-12-08, 50 yrs. old  GENDER:  female ENDOSCOPIST:  Carie Caddy. Jevon Shells, MD REF. BY:  Julaine Fusi, D.O. PROCEDURE DATE:  04/06/2011 PROCEDURE:  Colon with cold biopsy polypectomy ASA CLASS:  Class I INDICATIONS:  Routine Risk Screening MEDICATIONS:   These medications were titrated to patient response per physician's verbal order, Benadryl 50 mg IV, Versed 10 mg IV, Fentanyl 100 mcg IV  DESCRIPTION OF PROCEDURE:   After the risks benefits and alternatives of the procedure were thoroughly explained, informed consent was obtained.  Digital rectal exam was performed and revealed no rectal masses.   The LB [id number] endoscope was introduced through the anus and advanced to the cecum, which was identified by both the appendix and ileocecal valve, without limitations.  The quality of the prep was good, using MoviPrep. The instrument was then slowly withdrawn as the colon was fully examined. <<PROCEDUREIMAGES>>  FINDINGS:  A 4 mm sessile polyp was found in the ascending colon. The polyp was removed using cold biopsy forceps.  This was otherwise a normal examination of the colon.  Internal Hemorrhoids were found on retroflexion.   Retroflexed views in the rectum revealed no other findings other than those already described. The scope was then withdrawn from the cecum and the procedure completed.  COMPLICATIONS:  None ENDOSCOPIC IMPRESSION: 1) Sessile polyp in the ascending colon. Removed and sent to pathology. 2) Otherwise normal examination 3) Internal hemorrhoids  RECOMMENDATIONS: 1) Await pathology results 2) If the polyp(s) removed today are proven to be adenomatous (pre-cancerous) polyps, you will need a repeat colonoscopy in 5 years. Otherwise you should continue to follow colorectal cancer screening guidelines for  "routine risk" patients with colonoscopy in 10 years. 3) You will receive a letter within 1-2 weeks with the results of your biopsy as well as final recommendations. Please call my office if you have not received a letter after 3 weeks.  Carie Caddy. Rhea Belton, MD  CC:  Julaine Fusi DO The Patient  n. eSIGNEDCarie Caddy. Brookelyn Gaynor at 04/06/2011 09:53 AM  Etheleen Sia, [phone]

## 2011-04-09 ENCOUNTER — Telehealth: Payer: Self-pay | Admitting: *Deleted

## 2011-04-09 NOTE — Telephone Encounter (Signed)
LMOM

## 2011-04-18 ENCOUNTER — Telehealth: Payer: Self-pay | Admitting: Internal Medicine

## 2011-04-18 ENCOUNTER — Encounter: Payer: Self-pay | Admitting: Internal Medicine

## 2011-04-18 NOTE — Telephone Encounter (Signed)
Informed pt Dr Rhea Belton has not sent a letter yet, but her path shows no malignancy or adenomatous cells,but cells were hyperplastic. I do not know when her recall will be, but she has no cancer. Advised pt to call us if she hasn't received a letter in 2 more weeks; pt stated understanding.

## 2011-04-19 NOTE — Telephone Encounter (Signed)
Thanks, letter sent last night indicating recall recommendation.

## 2011-04-19 NOTE — Telephone Encounter (Signed)
Mailed letter to pt

## 2011-05-23 ENCOUNTER — Encounter: Payer: Self-pay | Admitting: Internal Medicine

## 2011-05-23 ENCOUNTER — Ambulatory Visit (INDEPENDENT_AMBULATORY_CARE_PROVIDER_SITE_OTHER): Payer: BC Managed Care – PPO | Admitting: Internal Medicine

## 2011-05-23 VITALS — BP 129/78 | HR 84 | Temp 97.0°F | Wt 215.6 lb

## 2011-05-23 DIAGNOSIS — F329 Major depressive disorder, single episode, unspecified: Secondary | ICD-10-CM

## 2011-05-23 DIAGNOSIS — I10 Essential (primary) hypertension: Secondary | ICD-10-CM

## 2011-05-23 DIAGNOSIS — F3289 Other specified depressive episodes: Secondary | ICD-10-CM

## 2011-05-23 DIAGNOSIS — E785 Hyperlipidemia, unspecified: Secondary | ICD-10-CM

## 2011-05-23 DIAGNOSIS — Z0000 Encounter for general adult medical examination without abnormal findings: Secondary | ICD-10-CM

## 2011-05-23 DIAGNOSIS — R51 Headache: Secondary | ICD-10-CM

## 2011-05-23 DIAGNOSIS — R350 Frequency of micturition: Secondary | ICD-10-CM

## 2011-05-23 DIAGNOSIS — M722 Plantar fascial fibromatosis: Secondary | ICD-10-CM

## 2011-05-23 LAB — BASIC METABOLIC PANEL
CO2: 24 mEq/L (ref 19–32)
Chloride: 106 mEq/L (ref 96–112)
Potassium: 4.5 mEq/L (ref 3.5–5.3)
Sodium: 139 mEq/L (ref 135–145)

## 2011-05-23 NOTE — Patient Instructions (Signed)
-  Return in 1 month for pap smear and to reassess your increased frequency of urination.  -If any of your labs are abnormal, I will call you.  -Please bring all of your medications with you at the next visit.   -if you have any new or worsening symptoms, please call the clinic at [phone]  Tension Headache (Muscle Contraction Headache) Tension headache is one of the most common causes of head pain. These headaches are usually felt as a pain over the top of your head and back of your neck. Stress, anxiety, and depression are common triggers for these headaches. Tension headaches are not life-threatening and will not lead to other types of headaches. Tension headaches can often be diagnosed by taking a history from the patient and a physical exam. Sometimes, further lab and x-ray studies are used to confirm the diagnosis. Your caregiver can advise you on how to get help solving problems that cause anxiety or stress. Antidepressants can be prescribed if depression is a problem. HOME CARE INSTRUCTIONS   If testing was done, call for your results. Remember, it is your responsibility to get the results of all testing. Do not assume everything is fine because you do not hear from your caregiver.   Only take over-the-counter or prescription medicines for pain, discomfort, or fever as directed by your caregiver.   Biofeedback, massage, or other relaxation techniques may be helpful.   Ice packs or heat to the head and neck can be used. Use these three to four times per day or as needed.   Physical therapy may be a useful addition to treatment.   If headaches continue, even with therapy, you may need to think about lifestyle changes.   Avoid excessive use of pain killers, as rebound headaches can occur.  SEEK MEDICAL CARE IF:   You develop problems with medications prescribed.   You do not respond or get no relief from medications.   You have a change from the usual headache.   You develop  nausea (feeling sick to your stomach) or vomiting.  SEEK IMMEDIATE MEDICAL CARE IF:   Your headache becomes severe.   You have an unexplained oral temperature above 102 F (38.9 C).   You develop a stiff neck.   You have loss of vision.   You have muscular weakness.   You have loss of muscular control.   You develop severe symptoms different from your first symptoms.   You start losing your balance or have trouble walking.   You feel faint or pass out.  MAKE SURE YOU:   Understand these instructions.   Will watch your condition.   Will get help right away if you are not doing well or get worse.  Document Released: 04/02/2005 Document Revised: 12/13/2010 Document Reviewed: 11/20/2007 [facility] Patient Information 2012 [place], [place].

## 2011-05-23 NOTE — Progress Notes (Signed)
Subjective:   Patient ID: Danielle Mason female   DOB: [birth year] 51 y.o.   MRN: [id number]  HPI: [name] is a 51 y.o. woman who presents for routine follow up.  She has 2 major complaints today.  1. Headache: not currently, but on Monday May 21, 2011, called out of work.  Bitemporal & behind neck; similar HA in the past.  Better with ibuprofen, no recurrence since Monday.  Has been sleeping well but husband c/o increased snoring.  No acute neurological changes like facial droop, weakness, numbness, tingling, changes in vision.  2. Increased urination for the last month.  3-4x/day, prior was 1-2x/day.  Reports that she holds urination to the point where she becomes incontinent on occasion.  Also c/o some urine incontinence with laughing/sneezing.  Has had 2 children, both C-sections.   Requests work letter for Monday (note that she was seen in the office today) and to avoid heavy machinery since she has recently received injections in b/l feet for plantar fascitis. She has follow up with her podiatrist in 3 weeks.   Tells me she is compliant with all of her medications, but meds are not with her today.  Past Medical History  Diagnosis Date  . Hypertension   . Seasonal allergies    Current Outpatient Prescriptions  Medication Sig Dispense Refill  . Ammonium Lactate 10 % LOTN Apply 1 application topically 2 (two) times daily.  1 Bottle  3  . aspirin EC 325 MG tablet Take 325 mg by mouth daily.        . diclofenac (VOLTAREN) 75 MG EC tablet       . hydrochlorothiazide 25 MG tablet Take 12.5 mg by mouth every morning.       Marland Kitchen ibuprofen (ADVIL,MOTRIN) 800 MG tablet Take 1 tablet (800 mg total) by mouth 2 (two) times daily.  90 tablet  6  . mometasone (ELOCON) 0.1 % ointment       . omeprazole (PRILOSEC) 20 MG capsule Take 20 mg by mouth daily. New medication, has not obtained from pharmacy       . PROTOPIC 0.1 % ointment       . terbinafine (LAMISIL) 250 MG tablet Take 250 mg by  mouth daily. New medication, has not obtained from pharmacy       . Calcium Carbonate-Vitamin D (CALTRATE 600+D) 600-400 MG-UNIT per tablet Take 1 tablet by mouth daily.       Marland Kitchen DEXILANT 60 MG capsule       . hydrOXYzine (ATARAX/VISTARIL) 10 MG tablet Take 10 mg by mouth every 4 (four) hours as needed. For itching New medication, has not started      . montelukast (SINGULAIR) 10 MG tablet        Family History  Problem Relation Age of Onset  . Colon cancer Neg Hx   . Esophageal cancer Neg Hx   . Stomach cancer Neg Hx    History   Social History  . Marital Status: Married    Spouse Name: N/A    Number of Children: N/A  . Years of Education: N/A   Social History Main Topics  . Smoking status: Never Smoker   . Smokeless tobacco: Never Used  . Alcohol Use: No  . Drug Use: No  . Sexually Active: None   Other Topics Concern  . None   Social History Narrative  . None   Review of Systems: General: no fevers, chills, changes in appetite, reports weight gain Skin:  no rash HEENT: no blurry vision, hearing changes, sore throat Pulm: no dyspnea, coughing, wheezing CV: no chest pain, palpitations, shortness of breath Abd: no abdominal pain, nausea/vomiting, diarrhea/constipation GU: no dysuria, hematuria Ext: no arthralgias, myalgias Neuro: no weakness, numbness, or tingling  Objective:  Physical Exam: Filed Vitals:   05/23/11 1541  BP: 129/78  Pulse: 84  Temp: 97 F (36.1 C)  TempSrc: Oral  Weight: 215 lb 9.6 oz (97.796 kg)   Constitutional: Vital signs reviewed.  Patient is a well-developed and well-nourished woman in no acute distress and cooperative with exam.  Mouth: no erythema or exudates, MMM Eyes: PERRL, EOMI, conjunctivae normal, No scleral icterus.  Neck: neck muscles tender to palpation, better with massage  Cardiovascular: RRR, S1 normal, S2 normal, no MRG, pulses symmetric and intact bilaterally Pulmonary/Chest: CTAB, no wheezes, rales, or  rhonchi Abdominal: Soft. Non-tender, non-distended, bowel sounds are normal, no masses, organomegaly, or guarding present.  Neurological: A&O x3, Strength is normal and symmetric bilaterally, cranial nerve II-XII are grossly intact, no focal motor deficit, sensory intact to light touch bilaterally.  Skin: Warm, dry and intact. No rash, cyanosis, or clubbing.  Psychiatric: Normal mood and affect. speech and behavior is normal. Judgment and thought content normal. Cognition and memory are normal.   Assessment & Plan:  Case and care discussed with Dr. Meredith Pel.  Patient to return in 1 month for pap smear & reassessment of urinary complaints. Please see problem oriented charting for further details.

## 2011-05-24 DIAGNOSIS — R350 Frequency of micturition: Secondary | ICD-10-CM | POA: Insufficient documentation

## 2011-05-24 LAB — URINALYSIS, ROUTINE W REFLEX MICROSCOPIC
Glucose, UA: NEGATIVE mg/dL
Leukocytes, UA: NEGATIVE
Nitrite: NEGATIVE
Specific Gravity, Urine: 1.027 (ref 1.005–1.030)
pH: 5.5 (ref 5.0–8.0)

## 2011-05-24 NOTE — Assessment & Plan Note (Signed)
I suspect there is a component of stress urinary incontinence in this patient, as well poor urinary habits.  I explained importance of not "holding in your urine."  She seems to understand, and tells me her husband says the same thing.  UA negative for infection.  Will assess for prolapse at next appointment with pap exam.

## 2011-05-24 NOTE — Assessment & Plan Note (Signed)
Check lipid panel at next visit.  Patient not on medication.

## 2011-05-24 NOTE — Assessment & Plan Note (Signed)
Being followed by podiatry, has received injections in b/l feet.

## 2011-05-24 NOTE — Assessment & Plan Note (Signed)
Lab Results  Component Value Date   NA 139 05/23/2011   K 4.5 05/23/2011   CL 106 05/23/2011   CO2 24 05/23/2011   BUN 21 05/23/2011   CREATININE 0.89 05/23/2011   CREATININE 0.62 10/10/2010    BP Readings from Last 3 Encounters:  05/23/11 129/78  04/06/11 115/77  03/27/11 121/76    Assessment: Hypertension control:  controlled  Progress toward goals:  at goal Barriers to meeting goals:  no barriers identified  Plan: Hypertension treatment:  continue current medications

## 2011-05-24 NOTE — Assessment & Plan Note (Signed)
Stable. No active issues.  Patient not on any antidepressive medications.

## 2011-05-24 NOTE — Assessment & Plan Note (Signed)
Seems to continue to have tension headaches that responds to ibuprofen, which she may continue.  I also suggested massage and warm compresses behind neck.

## 2011-06-06 ENCOUNTER — Other Ambulatory Visit: Payer: Self-pay | Admitting: Internal Medicine

## 2011-06-22 ENCOUNTER — Encounter: Payer: Self-pay | Admitting: *Deleted

## 2011-06-22 NOTE — Progress Notes (Unsigned)
Pt presents asking for a letter stating she can go back to full job duties starting Monday 06/25/2011, if you do this letter gayle will fax to pt's work:  [employer] Attn: hout phiffer  Fax: [phone]

## 2011-06-27 ENCOUNTER — Encounter: Payer: Self-pay | Admitting: Internal Medicine

## 2011-06-27 ENCOUNTER — Ambulatory Visit (INDEPENDENT_AMBULATORY_CARE_PROVIDER_SITE_OTHER): Payer: BC Managed Care – PPO | Admitting: Internal Medicine

## 2011-06-27 ENCOUNTER — Other Ambulatory Visit (HOSPITAL_COMMUNITY)
Admission: RE | Admit: 2011-06-27 | Discharge: 2011-06-27 | Disposition: A | Discharge status: Home / Self Care | Payer: BC Managed Care – PPO | Source: Ambulatory Visit | Attending: Internal Medicine | Admitting: Internal Medicine

## 2011-06-27 VITALS — BP 134/91 | HR 87 | Temp 96.9°F | Wt 218.3 lb

## 2011-06-27 DIAGNOSIS — Z01419 Encounter for gynecological examination (general) (routine) without abnormal findings: Secondary | ICD-10-CM | POA: Insufficient documentation

## 2011-06-27 DIAGNOSIS — Z124 Encounter for screening for malignant neoplasm of cervix: Secondary | ICD-10-CM

## 2011-06-27 DIAGNOSIS — E785 Hyperlipidemia, unspecified: Secondary | ICD-10-CM

## 2011-06-27 DIAGNOSIS — I10 Essential (primary) hypertension: Secondary | ICD-10-CM

## 2011-06-27 NOTE — Patient Instructions (Signed)
Thank you for having your pap smear done today.    Your letter for work will be ready in 1-2 business days.   Please be sure to bring all of your medications with you to every visit.  Should you have any new or worsening symptoms, please be sure to call the clinic at [phone].
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
## 2011-06-28 ENCOUNTER — Encounter: Payer: Self-pay | Admitting: Internal Medicine

## 2011-06-28 DIAGNOSIS — Z124 Encounter for screening for malignant neoplasm of cervix: Secondary | ICD-10-CM | POA: Insufficient documentation

## 2011-06-28 NOTE — Assessment & Plan Note (Signed)
Pap smear done on 06/27/11.  Chaperoned by Dr. Rogelia Boga.  Results on 06/28/11 - wnl. Repeat in 3 years, or if patient becomes sexually active with a new partner.

## 2011-06-28 NOTE — Assessment & Plan Note (Signed)
Lab Results  Component Value Date   NA 139 05/23/2011   K 4.5 05/23/2011   CL 106 05/23/2011   CO2 24 05/23/2011   BUN 21 05/23/2011   CREATININE 0.89 05/23/2011   CREATININE 0.62 10/10/2010    BP Readings from Last 3 Encounters:  06/27/11 134/91  05/23/11 129/78  04/06/11 115/77    Assessment: Hypertension control:  controlled  Progress toward goals:  at goal Barriers to meeting goals:  no barriers identified  Plan: Hypertension treatment:  continue current medications

## 2011-06-28 NOTE — Assessment & Plan Note (Signed)
Order placed on 06/28/11 for lipid panel.  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

## 2011-06-29 ENCOUNTER — Encounter: Payer: Self-pay | Admitting: Internal Medicine

## 2011-07-04 ENCOUNTER — Encounter: Payer: Self-pay | Admitting: Internal Medicine

## 2011-08-15 ENCOUNTER — Ambulatory Visit (INDEPENDENT_AMBULATORY_CARE_PROVIDER_SITE_OTHER): Payer: BC Managed Care – PPO | Admitting: Internal Medicine

## 2011-08-15 ENCOUNTER — Encounter: Payer: Self-pay | Admitting: Internal Medicine

## 2011-08-15 VITALS — BP 130/88 | HR 86 | Wt 222.5 lb

## 2011-08-15 DIAGNOSIS — E785 Hyperlipidemia, unspecified: Secondary | ICD-10-CM

## 2011-08-15 DIAGNOSIS — I10 Essential (primary) hypertension: Secondary | ICD-10-CM

## 2011-08-15 DIAGNOSIS — L309 Dermatitis, unspecified: Secondary | ICD-10-CM

## 2011-08-15 DIAGNOSIS — L259 Unspecified contact dermatitis, unspecified cause: Secondary | ICD-10-CM

## 2011-08-15 LAB — LIPID PANEL
HDL: 47 mg/dL (ref >39)
LDL Cholesterol: 176 mg/dL — ABNORMAL HIGH (ref 0–99)
Total CHOL/HDL Ratio: 5.2 Ratio
Triglycerides: 98 mg/dL (ref <150)
VLDL: 20 mg/dL (ref 0–40)

## 2011-08-15 MED ORDER — TRIAMCINOLONE ACETONIDE 0.1 % EX CREA: TOPICAL_CREAM | Freq: Four times a day (QID) | CUTANEOUS | Status: DC | PRN

## 2011-08-15 MED ORDER — PREDNISONE 20 MG PO TABS: ORAL_TABLET | ORAL | Status: AC

## 2011-08-15 NOTE — Assessment & Plan Note (Signed)
Lab Results  Component Value Date   NA 139 05/23/2011   K 4.5 05/23/2011   CL 106 05/23/2011   CO2 24 05/23/2011   BUN 21 05/23/2011   CREATININE 0.89 05/23/2011   CREATININE 0.62 10/10/2010    BP Readings from Last 3 Encounters:  08/15/11 130/88  06/27/11 134/91  05/23/11 129/78    Assessment: Hypertension control:  controlled  Progress toward goals:  at goal Barriers to meeting goals:  no barriers identified  Plan: Hypertension treatment:  continue current medications
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
## 2011-08-15 NOTE — Patient Instructions (Signed)
-  Please take prednisone as follows: 2 tablets daily (40mg  total) for 3days, 1 tablet daily (20mg  total) for 4 days.  -Please use triamcinolone cream to affected area as needed. -Please follow up with Dermatology (referral made today)  Please be sure to bring all of your medications with you to every visit.  Should you have any new or worsening symptoms, please be sure to call the clinic at [phone].

## 2011-08-15 NOTE — Assessment & Plan Note (Signed)
Lipid panel today

## 2011-08-15 NOTE — Assessment & Plan Note (Addendum)
I suspect patient is having flare of severe eczema.  Protopic & Mometasone used in the past.  Will do taper of prednisone (40mg  x 3d, 20mg  x 4d) with triamcinolone cream, with derm referral for long term plan.

## 2011-08-21 ENCOUNTER — Other Ambulatory Visit: Payer: Self-pay | Admitting: Podiatrist

## 2011-08-21 DIAGNOSIS — M25579 Pain in unspecified ankle and joints of unspecified foot: Secondary | ICD-10-CM

## 2011-08-24 ENCOUNTER — Ambulatory Visit
Admission: RE | Admit: 2011-08-24 | Discharge: 2011-08-24 | Disposition: A | Discharge status: Home / Self Care | Payer: BC Managed Care – PPO | Source: Ambulatory Visit | Attending: Podiatrist | Admitting: Podiatrist

## 2011-08-24 DIAGNOSIS — M25579 Pain in unspecified ankle and joints of unspecified foot: Secondary | ICD-10-CM
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
## 2011-10-24 ENCOUNTER — Ambulatory Visit (INDEPENDENT_AMBULATORY_CARE_PROVIDER_SITE_OTHER): Payer: BC Managed Care – PPO | Admitting: Internal Medicine

## 2011-10-24 ENCOUNTER — Encounter: Payer: Self-pay | Admitting: Internal Medicine

## 2011-10-24 VITALS — BP 141/89 | HR 95 | Temp 97.6°F | Wt 230.7 lb

## 2011-10-24 DIAGNOSIS — L259 Unspecified contact dermatitis, unspecified cause: Secondary | ICD-10-CM

## 2011-10-24 DIAGNOSIS — L309 Dermatitis, unspecified: Secondary | ICD-10-CM

## 2011-10-24 MED ORDER — TRIAMCINOLONE ACETONIDE 0.1 % EX CREA: TOPICAL_CREAM | Freq: Four times a day (QID) | CUTANEOUS | Status: DC | PRN

## 2011-10-24 NOTE — Patient Instructions (Signed)
-  Please have your physical therapist forward your visit notes to [place] (fax # [phone])  -Please use Triamcinolone cream on your arms, and be sure to be seen by a dermatologist.    Please be sure to bring all of your medications with you to every visit.  Should you have any new or worsening symptoms, please be sure to call the clinic at [phone].

## 2011-10-24 NOTE — Progress Notes (Signed)
  Subjective:   Patient ID: Danielle Mason female   DOB: 1961-02-15 51 y.o.   MRN: [id number]  HPI: [name] is a 51 y.o. with h/o HTN, allergies and eczema who presents with a papular, eczematous appearing rash on b/l upper extremities on the extensor surface.  She was seen for similar symptoms in May, and symptoms resolved with short prednisone course.  She did not fill the triamcinolone cream at that time, nor did she see the dermatologist for financial reasons. In the past, she has been seen by an allergist, who thought her rash was fungal in nature, and gave her a trial of lamisil - I am unclear if this worked, as she did not follow up with him, and this was in Oct 2012.  Rash on face has significantly improved since last visit.    Current Outpatient Prescriptions  Medication Sig Dispense Refill  . aspirin EC 325 MG tablet Take 325 mg by mouth daily.        . hydrochlorothiazide (HYDRODIURIL) 25 MG tablet TAKE ONE TABLET BY MOUTH EVERY DAY  30 tablet  3  . ibuprofen (ADVIL,MOTRIN) 800 MG tablet Take 1 tablet (800 mg total) by mouth 2 (two) times daily.  90 tablet  6  . Calcium Carbonate-Vitamin D (CALTRATE 600+D) 600-400 MG-UNIT per tablet Take 1 tablet by mouth daily.       Marland Kitchen triamcinolone cream (KENALOG) 0.1 % Apply topically 4 (four) times daily as needed. Do not apply to face.  30 g  0   Review of Systems: General: no fevers, chills, changes in weight, changes in appetite HEENT: no blurry vision, hearing changes, sore throat Pulm: no dyspnea, coughing, wheezing CV: no chest pain, palpitations, shortness of breath Abd: no abdominal pain, nausea/vomiting, diarrhea/constipation GU: no dysuria, hematuria, polyuria Ext: no arthralgias, myalgias Neuro: no weakness, numbness, or tingling   Objective:  Physical Exam: Filed Vitals:   10/24/11 1613  BP: 141/89  Pulse: 95  Temp: 97.6 F (36.4 C)  TempSrc: Oral  Weight: 230 lb 11.2 oz (104.645 kg)   Constitutional: Vital  signs reviewed.  Patient is a well-developed and well-nourished woman in no acute distress and cooperative with exam.  Eyes: PERRL, EOMI, conjunctivae normal, No scleral icterus.  Cardiovascular: RRR, S1 normal, S2 normal, no MRG, pulses symmetric and intact bilaterally Pulmonary/Chest: CTAB, no wheezes, rales, or rhonchi Abdominal: Soft. Non-tender, non-distended, bowel sounds are normal, no masses, organomegaly, or guarding present.  Musculoskeletal: No joint deformities, erythema, or stiffness, ROM full and no nontender Neurological: A&O x3, Strength is normal and symmetric bilaterally, cranial nerve II-XII are grossly intact, no focal motor deficit, sensory intact to light touch bilaterally.  Skin: Papular eczematous rash on extensor surface of b/l UE Psychiatric: Normal mood and affect. speech and behavior is normal. Judgment and thought content normal. Cognition and memory are normal.   Assessment & Plan:  Case and care discussed with Dr. Meredith Pel. Patient to return in 3 month for followup or sooner if needed. Please see problem oriented charting for further details.

## 2011-10-24 NOTE — Assessment & Plan Note (Signed)
Appears to be poorly controlled eczema.  Seen by allergist in past and thought to be tinea.  Improved with steroids but recurred.  Used protopic & mometasone in past.  Will defer another prednisone course for now, but have patient fill triamcinolone cream to trunk and hydrocortisone cream to face if needed (OTC strength).  Patient agreeable to derm appt if we can find a dermatologist willing to see her.

## 2011-11-22 DIAGNOSIS — Z880 Allergy status to penicillin: Secondary | ICD-10-CM | POA: Insufficient documentation

## 2011-12-26 ENCOUNTER — Encounter: Payer: Self-pay | Admitting: Internal Medicine

## 2011-12-26 ENCOUNTER — Ambulatory Visit (INDEPENDENT_AMBULATORY_CARE_PROVIDER_SITE_OTHER): Payer: BC Managed Care – PPO | Admitting: Internal Medicine

## 2011-12-26 VITALS — BP 136/88 | HR 84 | Temp 97.1°F | Wt 229.1 lb

## 2011-12-26 DIAGNOSIS — I10 Essential (primary) hypertension: Secondary | ICD-10-CM

## 2011-12-26 DIAGNOSIS — E785 Hyperlipidemia, unspecified: Secondary | ICD-10-CM

## 2011-12-26 DIAGNOSIS — M545 Low back pain: Secondary | ICD-10-CM

## 2011-12-26 NOTE — Patient Instructions (Addendum)
Your cholesterol was high at your last visit - you need to work on your diet and get some exercise.  Please be sure to bring all of your medications with you to every visit.  Should you have any new or worsening symptoms, please be sure to call the clinic at [phone].  Cholesterol Control Diet Cholesterol levels in your body are determined significantly by your diet. Cholesterol levels may also be related to heart disease. The following material helps to explain this relationship and discusses what you can do to help keep your heart healthy. Not all cholesterol is bad. Low-density lipoprotein (LDL) cholesterol is the "bad" cholesterol. It may cause fatty deposits to build up inside your arteries. High-density lipoprotein (HDL) cholesterol is "good." It helps to remove the "bad" LDL cholesterol from your blood. Cholesterol is a very important risk factor for heart disease. Other risk factors are high blood pressure, smoking, stress, heredity, and weight. The heart muscle gets its supply of blood through the coronary arteries. If your LDL cholesterol is high and your HDL cholesterol is low, you are at risk for having fatty deposits build up in your coronary arteries. This leaves less room through which blood can flow. Without sufficient blood and oxygen, the heart muscle cannot function properly and you may feel chest pains (angina pectoris). When a coronary artery closes up entirely, a part of the heart muscle may die, causing a heart attack (myocardial infarction). CHECKING CHOLESTEROL When your caregiver sends your blood to a lab to be analyzed for cholesterol, a complete lipid (fat) profile may be done. With this test, the total amount of cholesterol and levels of LDL and HDL are determined. Triglycerides are a type of fat that circulates in the blood and can also be used to determine heart disease risk. The list below describes what the numbers should be: Test: Total Cholesterol.  Less than 200  mg/dl.  Test: LDL "bad cholesterol."  Less than 100 mg/dl.   Less than 70 mg/dl if you are at very high risk of a heart attack or sudden cardiac death.  Test: HDL "good cholesterol."  Greater than 50 mg/dl for women.   Greater than 40 mg/dl for men.  Test: Triglycerides.  Less than 150 mg/dl.  CONTROLLING CHOLESTEROL WITH DIET Although exercise and lifestyle factors are important, your diet is key. That is because certain foods are known to raise cholesterol and others to lower it. The goal is to balance foods for their effect on cholesterol and more importantly, to replace saturated and trans fat with other types of fat, such as monounsaturated fat, polyunsaturated fat, and omega-3 fatty acids. On average, a person should consume no more than 15 to 17 g of saturated fat daily. Saturated and trans fats are considered "bad" fats, and they will raise LDL cholesterol. Saturated fats are primarily found in animal products such as meats, butter, and cream. However, that does not mean you need to sacrifice all your favorite foods. Today, there are good tasting, low-fat, low-cholesterol substitutes for most of the things you like to eat. Choose low-fat or nonfat alternatives. Choose round or loin cuts of red meat, since these types of cuts are lowest in fat and cholesterol. Chicken (without the skin), fish, veal, and ground [place] breast are excellent choices. Eliminate fatty meats, such as hot dogs and salami. Even shellfish have little or no saturated fat. Have a 3 oz (85 g) portion when you eat lean meat, poultry, or fish. Trans fats are also called "  partially hydrogenated oils." They are oils that have been scientifically manipulated so that they are solid at room temperature resulting in a longer shelf life and improved taste and texture of foods in which they are added. Trans fats are found in stick margarine, some tub margarines, cookies, crackers, and baked goods.  When baking and cooking, oils  are an excellent substitute for butter. The monounsaturated oils are especially beneficial since it is believed they lower LDL and raise HDL. The oils you should avoid entirely are saturated tropical oils, such as coconut and palm.  Remember to eat liberally from food groups that are naturally free of saturated and trans fat, including fish, fruit, vegetables, beans, grains (barley, rice, couscous, bulgur wheat), and pasta (without cream sauces).  IDENTIFYING FOODS THAT LOWER CHOLESTEROL  Soluble fiber may lower your cholesterol. This type of fiber is found in fruits such as apples, vegetables such as broccoli, potatoes, and carrots, legumes such as beans, peas, and lentils, and grains such as barley. Foods fortified with plant sterols (phytosterol) may also lower cholesterol. You should eat at least 2 g per day of these foods for a cholesterol lowering effect.  Read package labels to identify low-saturated fats, trans fats free, and low-fat foods at the supermarket. Select cheeses that have only 2 to 3 g saturated fat per ounce. Use a heart-healthy tub margarine that is free of trans fats or partially hydrogenated oil. When buying baked goods (cookies, crackers), avoid partially hydrogenated oils. Breads and muffins should be made from whole grains (whole-wheat or whole oat flour, instead of "flour" or "enriched flour"). Buy non-creamy canned soups with reduced salt and no added fats.  FOOD PREPARATION TECHNIQUES  Never deep-fry. If you must fry, either stir-fry, which uses very little fat, or use non-stick cooking sprays. When possible, broil, bake, or roast meats, and steam vegetables. Instead of dressing vegetables with butter or margarine, use lemon and herbs, applesauce and cinnamon (for squash and sweet potatoes), nonfat yogurt, salsa, and low-fat dressings for salads.  LOW-SATURATED FAT / LOW-FAT FOOD SUBSTITUTES Meats / Saturated Fat (g)  Avoid: Steak, marbled (3 oz/85 g) / 11 g   Choose: Steak,  lean (3 oz/85 g) / 4 g   Avoid: Hamburger (3 oz/85 g) / 7 g   Choose: Hamburger, lean (3 oz/85 g) / 5 g   Avoid: Ham (3 oz/85 g) / 6 g   Choose: Ham, lean cut (3 oz/85 g) / 2.4 g   Avoid: Chicken, with skin, dark meat (3 oz/85 g) / 4 g   Choose: Chicken, skin removed, dark meat (3 oz/85 g) / 2 g   Avoid: Chicken, with skin, light meat (3 oz/85 g) / 2.5 g   Choose: Chicken, skin removed, light meat (3 oz/85 g) / 1 g  Dairy / Saturated Fat (g)  Avoid: Whole milk (1 cup) / 5 g   Choose: Low-fat milk, 2% (1 cup) / 3 g   Choose: Low-fat milk, 1% (1 cup) / 1.5 g   Choose: Skim milk (1 cup) / 0.3 g   Avoid: Hard cheese (1 oz/28 g) / 6 g   Choose: Skim milk cheese (1 oz/28 g) / 2 to 3 g   Avoid: Cottage cheese, 4% fat (1 cup) / 6.5 g   Choose: Low-fat cottage cheese, 1% fat (1 cup) / 1.5 g   Avoid: Ice cream (1 cup) / 9 g   Choose: Sherbet (1 cup) / 2.5 g   Choose: Nonfat frozen  yogurt (1 cup) / 0.3 g   Choose: Frozen fruit bar / trace   Avoid: Whipped cream (1 tbs) / 3.5 g   Choose: Nondairy whipped topping (1 tbs) / 1 g  Condiments / Saturated Fat (g)  Avoid: Mayonnaise (1 tbs) / 2 g   Choose: Low-fat mayonnaise (1 tbs) / 1 g   Avoid: Butter (1 tbs) / 7 g   Choose: Extra light margarine (1 tbs) / 1 g   Avoid: Coconut oil (1 tbs) / 11.8 g   Choose: Olive oil (1 tbs) / 1.8 g   Choose: Corn oil (1 tbs) / 1.7 g   Choose: Safflower oil (1 tbs) / 1.2 g   Choose: Sunflower oil (1 tbs) / 1.4 g   Choose: Soybean oil (1 tbs) / 2.4 g   Choose: Canola oil (1 tbs) / 1 g  Document Released: 04/02/2005 Document Revised: 03/22/2011 Document Reviewed: 09/21/2010 [facility] Patient Information 2012 [place], [place].
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
## 2011-12-27 LAB — COMPREHENSIVE METABOLIC PANEL
ALT: 14 U/L (ref 0–35)
AST: 15 U/L (ref 0–37)
Alkaline Phosphatase: 67 U/L (ref 39–117)
CO2: 25 mEq/L (ref 19–32)
Creat: 0.83 mg/dL (ref 0.50–1.10)
Sodium: 139 mEq/L (ref 135–145)
Total Bilirubin: 0.4 mg/dL (ref 0.3–1.2)

## 2011-12-27 NOTE — Assessment & Plan Note (Signed)
LDL = 176 four months ago.  Patient is not on statin.  We discussed diet modification and I gave her information regarding a better diet.  We will recheck in approx 8 months, and if remains elevated then initiate statin therapy.

## 2011-12-27 NOTE — Assessment & Plan Note (Signed)
Lab Results  Component Value Date   NA 139 12/26/2011   K 4.1 12/26/2011   CL 107 12/26/2011   CO2 25 12/26/2011   BUN 19 12/26/2011   CREATININE 0.83 12/26/2011   CREATININE 0.62 10/10/2010    BP Readings from Last 3 Encounters:  12/26/11 136/88  10/24/11 141/89  08/15/11 130/88    Assessment: Hypertension control:  controlled  Progress toward goals:  at goal Barriers to meeting goals:  no barriers identified  Plan: Hypertension treatment:  continue current medications - HCTZ 25mg  daily

## 2011-12-27 NOTE — Assessment & Plan Note (Signed)
Letter provided suggesting patient be allowed to refrain from use of heavy machinery as patient reports that it exacerbates her pain.  11/2003 lumbar MRI reveals post surgical changes are seen on the right with a hemilaminectomy defect noted. There is a recurrent right paracentral disk protrusion. This is inferior to the exiting L4 root. The L4 root exits without encroachment. Also mild degenerative changes L4-5 and L5-S1.

## 2012-01-09 ENCOUNTER — Telehealth: Payer: Self-pay | Admitting: *Deleted

## 2012-01-09 ENCOUNTER — Encounter: Payer: Self-pay | Admitting: Internal Medicine

## 2012-01-09 ENCOUNTER — Ambulatory Visit (HOSPITAL_COMMUNITY)
Admission: RE | Admit: 2012-01-09 | Discharge: 2012-01-09 | Disposition: A | Discharge status: Home / Self Care | Payer: BC Managed Care – PPO | Source: Ambulatory Visit | Attending: Internal Medicine | Admitting: Internal Medicine

## 2012-01-09 ENCOUNTER — Ambulatory Visit (INDEPENDENT_AMBULATORY_CARE_PROVIDER_SITE_OTHER): Payer: BC Managed Care – PPO | Admitting: Internal Medicine

## 2012-01-09 VITALS — BP 132/92 | HR 85 | Temp 98.6°F | Wt 229.7 lb

## 2012-01-09 DIAGNOSIS — S60943A Unspecified superficial injury of left middle finger, initial encounter: Secondary | ICD-10-CM

## 2012-01-09 DIAGNOSIS — S60949A Unspecified superficial injury of unspecified finger, initial encounter: Secondary | ICD-10-CM

## 2012-01-09 DIAGNOSIS — M79609 Pain in unspecified limb: Secondary | ICD-10-CM | POA: Insufficient documentation

## 2012-01-09 DIAGNOSIS — X58XXXA Exposure to other specified factors, initial encounter: Secondary | ICD-10-CM
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
## 2012-01-09 NOTE — Assessment & Plan Note (Signed)
1 mm minimally palpable papule that is tender to palpation.  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  Otherwise, will suggest protection of area while working, may be irritated callous.  Nothing worrisome, return to clinic if no improvement. Should resolve spontaneously.

## 2012-01-09 NOTE — Patient Instructions (Signed)
-  Please have x-ray done of your left middle finger - also be sure to keep that area protected over the next few weeks - try wearing rubber gloves at work.  -You can buy compression stockings from any medical supply store/pharmacy (try [facility]), to help with the swelling of your lower legs.  Please be sure to bring all of your medications with you to every visit.  Should you have any new or worsening symptoms, please be sure to call the clinic at [phone].

## 2012-01-09 NOTE — Telephone Encounter (Signed)
Pt walked in to clinic forgot to talk at last appt time about pricking feeling in middle finger left hand. Appt given 01/09/12 1:15PM Dr Everardo Beals. Stanton Kidney Marvin Maenza RN 01/09/12 9:30AM

## 2012-02-23 ENCOUNTER — Encounter (HOSPITAL_COMMUNITY): Payer: Self-pay | Admitting: *Deleted

## 2012-02-23 ENCOUNTER — Emergency Department (HOSPITAL_COMMUNITY)
Admission: EM | Admit: 2012-02-23 | Discharge: 2012-02-23 | Disposition: A | Discharge status: Home / Self Care | Payer: BC Managed Care – PPO | Attending: Emergency Medicine | Admitting: Emergency Medicine

## 2012-02-23 DIAGNOSIS — Z8709 Personal history of other diseases of the respiratory system: Secondary | ICD-10-CM | POA: Insufficient documentation

## 2012-02-23 DIAGNOSIS — R059 Cough, unspecified: Secondary | ICD-10-CM | POA: Insufficient documentation

## 2012-02-23 DIAGNOSIS — Z8614 Personal history of Methicillin resistant Staphylococcus aureus infection: Secondary | ICD-10-CM | POA: Insufficient documentation

## 2012-02-23 DIAGNOSIS — J069 Acute upper respiratory infection, unspecified: Secondary | ICD-10-CM | POA: Insufficient documentation

## 2012-02-23 DIAGNOSIS — Z79899 Other long term (current) drug therapy: Secondary | ICD-10-CM | POA: Insufficient documentation

## 2012-02-23 DIAGNOSIS — Z7982 Long term (current) use of aspirin: Secondary | ICD-10-CM | POA: Insufficient documentation

## 2012-02-23 DIAGNOSIS — I10 Essential (primary) hypertension: Secondary | ICD-10-CM | POA: Insufficient documentation

## 2012-02-23 DIAGNOSIS — Z87828 Personal history of other (healed) physical injury and trauma: Secondary | ICD-10-CM | POA: Insufficient documentation

## 2012-02-23 DIAGNOSIS — R05 Cough: Secondary | ICD-10-CM | POA: Insufficient documentation

## 2012-02-23 MED ORDER — PROMETHAZINE-DM 6.25-15 MG/5ML PO SYRP: 5.0000 mL | ORAL_SOLUTION | Freq: Four times a day (QID) | ORAL | Status: DC | PRN

## 2012-02-23 MED ORDER — GUAIFENESIN ER 1200 MG PO TB12: 1.0000 | ORAL_TABLET | Freq: Two times a day (BID) | ORAL | Status: DC

## 2012-02-23 MED ORDER — PREDNISONE 50 MG PO TABS: 50.0000 mg | ORAL_TABLET | Freq: Every day | ORAL | Status: DC

## 2012-02-23 NOTE — ED Provider Notes (Signed)
Medical screening examination/treatment/procedure(s) were performed by non-physician practitioner and as supervising physician I was immediately available for consultation/collaboration.   Solyana Nonaka, MD 02/23/12 1509 
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
## 2012-02-23 NOTE — ED Notes (Signed)
Pt reports URI symptoms and strong, productive cough since Monday. Believes fever was present over past few days. Describes pain as "burning" and "tingling" over chest and upper respiratory area

## 2012-03-17 ENCOUNTER — Encounter (HOSPITAL_BASED_OUTPATIENT_CLINIC_OR_DEPARTMENT_OTHER): Payer: Self-pay | Admitting: *Deleted

## 2012-03-17 NOTE — Progress Notes (Signed)
To come in for bmet ekg-has had a cold-been tx by [facility] residents-no fever-no chest congestion- Denies any cardiac problems

## 2012-03-18 ENCOUNTER — Encounter (HOSPITAL_BASED_OUTPATIENT_CLINIC_OR_DEPARTMENT_OTHER)
Admission: RE | Admit: 2012-03-18 | Discharge: 2012-03-18 | Disposition: A | Discharge status: Home / Self Care | Payer: Worker's Compensation | Source: Ambulatory Visit | Attending: Orthopedic Surgery | Admitting: Orthopedic Surgery

## 2012-03-18 LAB — BASIC METABOLIC PANEL
GFR calc Af Amer: 88 mL/min — ABNORMAL LOW (ref >90)
GFR calc non Af Amer: 76 mL/min — ABNORMAL LOW (ref >90)
Glucose, Bld: 118 mg/dL — ABNORMAL HIGH (ref 70–99)
Potassium: 4.4 mEq/L (ref 3.5–5.1)
Sodium: 138 mEq/L (ref 135–145)

## 2012-03-18 NOTE — Progress Notes (Signed)
EKG reviewed by Dr Sampson Goon. No new changes no new orders.

## 2012-03-19 ENCOUNTER — Encounter (HOSPITAL_BASED_OUTPATIENT_CLINIC_OR_DEPARTMENT_OTHER): Payer: Self-pay

## 2012-03-19 ENCOUNTER — Ambulatory Visit (HOSPITAL_BASED_OUTPATIENT_CLINIC_OR_DEPARTMENT_OTHER)
Admission: RE | Admit: 2012-03-19 | Discharge: 2012-03-19 | Disposition: A | Discharge status: Home / Self Care | Payer: Worker's Compensation | Source: Ambulatory Visit | Attending: Orthopedic Surgery | Admitting: Orthopedic Surgery

## 2012-03-19 ENCOUNTER — Ambulatory Visit (HOSPITAL_BASED_OUTPATIENT_CLINIC_OR_DEPARTMENT_OTHER): Payer: Worker's Compensation | Admitting: Certified Registered"

## 2012-03-19 ENCOUNTER — Encounter (HOSPITAL_BASED_OUTPATIENT_CLINIC_OR_DEPARTMENT_OTHER)
Admission: RE | Disposition: A | Discharge status: Home / Self Care | Payer: Self-pay | Source: Ambulatory Visit | Attending: Orthopedic Surgery

## 2012-03-19 ENCOUNTER — Encounter (HOSPITAL_BASED_OUTPATIENT_CLINIC_OR_DEPARTMENT_OTHER): Payer: Self-pay | Admitting: Certified Registered"

## 2012-03-19 DIAGNOSIS — Z6841 Body Mass Index (BMI) 40.0 and over, adult: Secondary | ICD-10-CM | POA: Insufficient documentation

## 2012-03-19 DIAGNOSIS — I10 Essential (primary) hypertension: Secondary | ICD-10-CM | POA: Insufficient documentation

## 2012-03-19 DIAGNOSIS — M249 Joint derangement, unspecified: Secondary | ICD-10-CM | POA: Insufficient documentation

## 2012-03-19 DIAGNOSIS — M25572 Pain in left ankle and joints of left foot: Secondary | ICD-10-CM

## 2012-03-19 DIAGNOSIS — M24176 Other articular cartilage disorders, unspecified foot: Secondary | ICD-10-CM | POA: Insufficient documentation

## 2012-03-19 DIAGNOSIS — K219 Gastro-esophageal reflux disease without esophagitis: Secondary | ICD-10-CM | POA: Insufficient documentation

## 2012-03-19 HISTORY — DX: Gastro-esophageal reflux disease without esophagitis: K21.9

## 2012-03-19 HISTORY — PX: ANKLE ARTHROSCOPY: SHX545

## 2012-03-19 SURGERY — ARTHROSCOPY, ANKLE
Anesthesia: General | Site: Ankle | Laterality: Left | Wound class: Clean

## 2012-03-19 MED ORDER — DEXAMETHASONE SODIUM PHOSPHATE 4 MG/ML IJ SOLN
INTRAMUSCULAR | Status: DC | PRN
  Administered 2012-03-19: 8 mg via INTRAVENOUS

## 2012-03-19 MED ORDER — VANCOMYCIN HCL 10 G IV SOLR
1500.0000 mg | INTRAVENOUS | Status: AC
  Administered 2012-03-19: 1500 mg via INTRAVENOUS

## 2012-03-19 MED ORDER — FENTANYL CITRATE 0.05 MG/ML IJ SOLN: INTRAMUSCULAR | Status: DC | PRN

## 2012-03-19 MED ORDER — SODIUM CHLORIDE 0.9 % IV SOLN: INTRAVENOUS | Status: DC

## 2012-03-19 MED ORDER — LACTATED RINGERS IV SOLN
INTRAVENOUS | Status: DC
  Administered 2012-03-19 (×2): via INTRAVENOUS

## 2012-03-19 MED ORDER — CHLORHEXIDINE GLUCONATE 4 % EX LIQD: 60.0000 mL | Freq: Once | CUTANEOUS | Status: DC

## 2012-03-19 MED ORDER — ONDANSETRON HCL 4 MG/2ML IJ SOLN
INTRAMUSCULAR | Status: DC | PRN
  Administered 2012-03-19: 4 mg via INTRAVENOUS

## 2012-03-19 MED ORDER — METHOCARBAMOL 500 MG PO TABS: 500.0000 mg | ORAL_TABLET | Freq: Three times a day (TID) | ORAL | Status: DC

## 2012-03-19 MED ORDER — LIDOCAINE HCL (CARDIAC) 20 MG/ML IV SOLN
INTRAVENOUS | Status: DC | PRN
  Administered 2012-03-19: 85 mg via INTRAVENOUS

## 2012-03-19 MED ORDER — HYDROCODONE-ACETAMINOPHEN 5-325 MG PO TABS: 1.0000 | ORAL_TABLET | Freq: Four times a day (QID) | ORAL | Status: DC | PRN

## 2012-03-19 MED ORDER — FENTANYL CITRATE 0.05 MG/ML IJ SOLN
100.0000 ug | Freq: Once | INTRAMUSCULAR | Status: AC
  Administered 2012-03-19: 100 ug via INTRAVENOUS

## 2012-03-19 MED ORDER — MIDAZOLAM HCL 2 MG/2ML IJ SOLN
1.0000 mg | INTRAMUSCULAR | Status: DC | PRN
  Administered 2012-03-19: 2 mg via INTRAVENOUS

## 2012-03-19 MED ORDER — ASPIRIN EC 325 MG PO TBEC: 325.0000 mg | DELAYED_RELEASE_TABLET | Freq: Two times a day (BID) | ORAL | Status: DC

## 2012-03-19 MED ORDER — FENTANYL CITRATE 0.05 MG/ML IJ SOLN
INTRAMUSCULAR | Status: DC | PRN
  Administered 2012-03-19: 175 ug via INTRAVENOUS
  Administered 2012-03-19 (×2): 25 ug via INTRAVENOUS

## 2012-03-19 MED ORDER — PROMETHAZINE HCL 25 MG/ML IJ SOLN: 6.2500 mg | INTRAMUSCULAR | Status: DC | PRN

## 2012-03-19 MED ORDER — BUPIVACAINE-EPINEPHRINE PF 0.5-1:200000 % IJ SOLN
INTRAMUSCULAR | Status: DC | PRN
  Administered 2012-03-19: 30 mL

## 2012-03-19 MED ORDER — OXYCODONE HCL 5 MG PO TABS: 5.0000 mg | ORAL_TABLET | Freq: Once | ORAL | Status: DC | PRN

## 2012-03-19 MED ORDER — HYDROMORPHONE HCL PF 1 MG/ML IJ SOLN: 0.2500 mg | INTRAMUSCULAR | Status: DC | PRN

## 2012-03-19 MED ORDER — PROPOFOL 10 MG/ML IV BOLUS
INTRAVENOUS | Status: DC | PRN
  Administered 2012-03-19: 200 mg via INTRAVENOUS

## 2012-03-19 MED ORDER — VANCOMYCIN HCL IN DEXTROSE 1-5 GM/200ML-% IV SOLN: 1000.0000 mg | INTRAVENOUS | Status: DC

## 2012-03-19 MED ORDER — SODIUM CHLORIDE 0.9 % IR SOLN
Status: DC | PRN
  Administered 2012-03-19: 1

## 2012-03-19 MED ORDER — VITAMIN C 500 MG PO TABS: 500.0000 mg | ORAL_TABLET | Freq: Every day | ORAL | Status: DC

## 2012-03-19 MED ORDER — OXYCODONE HCL 5 MG/5ML PO SOLN: 5.0000 mg | Freq: Once | ORAL | Status: DC | PRN

## 2012-03-19 MED ORDER — MEPERIDINE HCL 25 MG/ML IJ SOLN: 6.2500 mg | INTRAMUSCULAR | Status: DC | PRN

## 2012-03-19 SURGICAL SUPPLY — 57 items
BANDAGE ELASTIC 4 VELCRO ST LF (GAUZE/BANDAGES/DRESSINGS) IMPLANT
BANDAGE ELASTIC 6 VELCRO ST LF (GAUZE/BANDAGES/DRESSINGS) IMPLANT
BANDAGE GAUZE ELAST BULKY 4 IN (GAUZE/BANDAGES/DRESSINGS) IMPLANT
BLADE CUDA 2.0 (BLADE) IMPLANT
BLADE CUDA SHAVER 3.5 (BLADE) IMPLANT
BLADE SURG 15 STRL LF DISP TIS (BLADE) IMPLANT
BLADE SURG 15 STRL SS (BLADE)
BRUSH SCRUB EZ PLAIN DRY (MISCELLANEOUS) ×2 IMPLANT
BUR 3.5 LG SPHERICAL (BURR) IMPLANT
BUR CUDA 2.9 (BURR) ×2 IMPLANT
BUR GATOR 2.9 (BURR) IMPLANT
BUR OVAL 4.0 (BURR) IMPLANT
BUR SPHERICAL 2.9 (BURR) IMPLANT
BURR 3.5 LG SPHERICAL (BURR)
CANISTER OMNI JUG 16 LITER (MISCELLANEOUS) IMPLANT
CANISTER SUCTION 2500CC (MISCELLANEOUS) ×2 IMPLANT
CLOTH BEACON ORANGE TIMEOUT ST (SAFETY) ×2 IMPLANT
CUFF TOURNIQUET SINGLE 34IN LL (TOURNIQUET CUFF) ×2 IMPLANT
DRAPE ARTHROSCOPY W/POUCH 114 (DRAPES) ×2 IMPLANT
DRAPE OEC MINIVIEW 54X84 (DRAPES) IMPLANT
DRAPE SURG 17X23 STRL (DRAPES) ×2 IMPLANT
DURA STEPPER LG (CAST SUPPLIES) IMPLANT
DURA STEPPER MED (CAST SUPPLIES) IMPLANT
DURA STEPPER SML (CAST SUPPLIES) IMPLANT
ELECT REM PT RETURN 9FT ADLT (ELECTROSURGICAL) ×2
ELECTRODE REM PT RTRN 9FT ADLT (ELECTROSURGICAL) ×1 IMPLANT
GAUZE XEROFORM 1X8 LF (GAUZE/BANDAGES/DRESSINGS) ×2 IMPLANT
GLOVE BIO SURGEON STRL SZ 6.5 (GLOVE) ×2 IMPLANT
GLOVE BIO SURGEON STRL SZ8 (GLOVE) ×2 IMPLANT
GLOVE BIOGEL PI IND STRL 8 (GLOVE) ×2 IMPLANT
GLOVE BIOGEL PI INDICATOR 8 (GLOVE) ×2
GLOVE INDICATOR 7.0 STRL GRN (GLOVE) ×2 IMPLANT
GLOVE SURG SS PI 8.0 STRL IVOR (GLOVE) ×2 IMPLANT
GOWN BRE IMP PREV XXLGXLNG (GOWN DISPOSABLE) ×4 IMPLANT
GOWN PREVENTION PLUS XLARGE (GOWN DISPOSABLE) ×2 IMPLANT
NS IRRIG 1000ML POUR BTL (IV SOLUTION) IMPLANT
PACK ARTHROSCOPY DSU (CUSTOM PROCEDURE TRAY) ×2 IMPLANT
PACK BASIN DAY SURGERY FS (CUSTOM PROCEDURE TRAY) ×2 IMPLANT
PADDING CAST ABS 4INX4YD NS (CAST SUPPLIES) ×1
PADDING CAST ABS COTTON 4X4 ST (CAST SUPPLIES) ×1 IMPLANT
PENCIL BUTTON HOLSTER BLD 10FT (ELECTRODE) IMPLANT
SPONGE GAUZE 4X4 12PLY (GAUZE/BANDAGES/DRESSINGS) ×2 IMPLANT
STOCKINETTE 6  STRL (DRAPES) ×1
STOCKINETTE 6 STRL (DRAPES) ×1 IMPLANT
STRAP ANKLE FOOT DISTRACTOR (ORTHOPEDIC SUPPLIES) IMPLANT
SUCTION FRAZIER TIP 10 FR DISP (SUCTIONS) IMPLANT
SUT ETHILON 4 0 PS 2 18 (SUTURE) ×2 IMPLANT
SUT VIC AB 3-0 PS1 18 (SUTURE)
SUT VIC AB 3-0 PS1 18XBRD (SUTURE) IMPLANT
SYR BULB 3OZ (MISCELLANEOUS) IMPLANT
TOWEL OR 17X24 6PK STRL BLUE (TOWEL DISPOSABLE) ×2 IMPLANT
TOWEL OR NON WOVEN STRL DISP B (DISPOSABLE) ×2 IMPLANT
TUBE CONNECTING 20X1/4 (TUBING) ×4 IMPLANT
TUBING ARTHROSCOPY IRRIG 16FT (MISCELLANEOUS) ×2 IMPLANT
UNDERPAD 30X30 INCONTINENT (UNDERPADS AND DIAPERS) ×2 IMPLANT
WAND SHORT BEVEL W/CORD (SURGICAL WAND) ×2 IMPLANT
WATER STERILE IRR 1000ML POUR (IV SOLUTION) IMPLANT

## 2012-03-19 NOTE — Transfer of Care (Signed)
Immediate Anesthesia Transfer of Care Note  Patient: Danielle Mason  Procedure(s) Performed: Procedure(s) (LRB) with comments: ANKLE ARTHROSCOPY (Left) - Arthroscopy left ankle with extensive debridement   Patient Location: PACU  Anesthesia Type:GA combined with regional for post-op pain  Level of Consciousness: awake, alert , oriented and patient cooperative  Airway & Oxygen Therapy: Patient Spontanous Breathing and Patient connected to face mask oxygen  Post-op Assessment: Report given to PACU RN and Post -op Vital signs reviewed and stable  Post vital signs: Reviewed and stable  Complications: No apparent anesthesia complications

## 2012-03-19 NOTE — Anesthesia Preprocedure Evaluation (Signed)
Anesthesia Evaluation  Patient identified by MRN, date of birth, ID band Patient awake    Reviewed: Allergy & Precautions, H&P , NPO status   History of Anesthesia Complications Negative for: history of anesthetic complications  Airway Mallampati: II  Neck ROM: Full    Dental   Pulmonary    + decreased breath sounds      Cardiovascular hypertension, Pt. on medications Rhythm:Regular Rate:Normal     Neuro/Psych  Headaches,  Neuromuscular disease    GI/Hepatic GERD-  ,  Endo/Other  Morbid obesity  Renal/GU      Musculoskeletal   Abdominal (+) + obese,   Peds  Hematology   Anesthesia Other Findings   Reproductive/Obstetrics                           Anesthesia Physical Anesthesia Plan  ASA: III  Anesthesia Plan: General   Post-op Pain Management:    Induction: Intravenous  Airway Management Planned: LMA  Additional Equipment:   Intra-op Plan:   Post-operative Plan: Extubation in OR  Informed Consent: I have reviewed the patients History and Physical, chart, labs and discussed the procedure including the risks, benefits and alternatives for the proposed anesthesia with the patient or authorized representative who has indicated his/her understanding and acceptance.   Dental advisory given  Plan Discussed with: CRNA and Surgeon  Anesthesia Plan Comments:         Anesthesia Quick Evaluation

## 2012-03-19 NOTE — Progress Notes (Signed)
Assisted Dr. Massagee with left, ultrasound guided, popliteal block. Side rails up, monitors on throughout procedure. See vital signs in flow sheet. Tolerated Procedure well. 

## 2012-03-19 NOTE — H&P (Signed)
  H&P documentation: Placed to be scanned history and physical exam in chart.  -History and Physical Reviewed  -Patient has been re-examined  -No change in the plan of care  Danielle Mason A  

## 2012-03-19 NOTE — Anesthesia Postprocedure Evaluation (Signed)
  Anesthesia Post-op Note  Patient: Danielle Mason  Procedure(s) Performed: Procedure(s) (LRB) with comments: ANKLE ARTHROSCOPY (Left) - Arthroscopy left ankle with extensive debridement   Patient Location: PACU  Anesthesia Type:GA combined with regional for post-op pain  Level of Consciousness: awake  Airway and Oxygen Therapy: Patient Spontanous Breathing  Post-op Pain: mild  Post-op Assessment: Post-op Vital signs reviewed  Post-op Vital Signs: stable  Complications: No apparent anesthesia complications

## 2012-03-19 NOTE — Anesthesia Procedure Notes (Addendum)
Anesthesia Regional Block:  Popliteal block  Pre-Anesthetic Checklist: ,, timeout performed, Correct Patient, Correct Site, Correct Laterality, Correct Procedure, Correct Position, site marked, Risks and benefits discussed,  Surgical consent,  Pre-op evaluation,  At surgeon's request and post-op pain management  Laterality: Left and Lower  Prep: chloraprep       Needles:   Needle Type: Echogenic Needle        Needle insertion depth: 6 cm   Additional Needles:  Procedures: ultrasound guided (picture in chart) Popliteal block Narrative:  Start time: 03/19/2012 9:20 AM End time: 03/19/2012 9:40 AM Injection made incrementally with aspirations every 5 mL.  Performed by: Personally  Anesthesiologist: T Massagee   Procedure Name: LMA Insertion Date/Time: 03/19/2012 10:43 AM Performed by: Verlan Friends Pre-anesthesia Checklist: Patient identified, Emergency Drugs available, Suction available, Patient being monitored and Timeout performed Patient Re-evaluated:Patient Re-evaluated prior to inductionOxygen Delivery Method: Circle System Utilized Preoxygenation: Pre-oxygenation with 100% oxygen Intubation Type: IV induction Ventilation: Mask ventilation without difficulty LMA: LMA with gastric port inserted LMA Size: 4.0 Number of attempts: 1 Placement Confirmation: positive ETCO2 Tube secured with: Tape Dental Injury: Teeth and Oropharynx as per pre-operative assessment

## 2012-03-19 NOTE — Brief Op Note (Signed)
03/19/2012  11:28 AM  PATIENT:  Danielle Mason  51 y.o. female  PRE-OPERATIVE DIAGNOSIS:  left anterolateral impingement ankle   POST-OPERATIVE DIAGNOSIS:  left anterolateral impingement ankle  PROCEDURE:  Procedure(s) (LRB) with comments: ANKLE ARTHROSCOPY (Left) - Arthroscopy left ankle with extensive debridement   SURGEON:  Surgeon(s) and Role:    * Sherri Rad, MD - Primary  PHYSICIAN ASSISTANT: Rexene Edison, PAC   ASSISTANTS: Rexene Edison, [facility]    ANESTHESIA:   general  EBL:  Total I/O In: 300 [I.V.:300] Out: -   BLOOD ADMINISTERED:none  DRAINS: none   LOCAL MEDICATIONS USED:  NONE  SPECIMEN:  No Specimen  DISPOSITION OF SPECIMEN:  N/A  COUNTS:  YES  TOURNIQUET:  * Missing tourniquet times found for documented tourniquets in log:  [id number] *  DICTATION: .Other Dictation: Dictation Number [id number]  PLAN OF CARE: Discharge to home after PACU  PATIENT DISPOSITION:  PACU - hemodynamically stable.   Delay start of Pharmacological VTE agent (>24hrs) due to surgical blood loss or risk of bleeding: no

## 2012-03-20 ENCOUNTER — Encounter (HOSPITAL_BASED_OUTPATIENT_CLINIC_OR_DEPARTMENT_OTHER): Payer: Self-pay | Admitting: Orthopedic Surgery

## 2012-03-20 LAB — POCT HEMOGLOBIN-HEMACUE: Hemoglobin: 11.8 g/dL — ABNORMAL LOW (ref 12.0–15.0)

## 2012-03-20 NOTE — Op Note (Signed)
Danielle Mason, Danielle Mason                ACCOUNT NO.:  [id number]  MEDICAL RECORD NO.:  [id number]  LOCATION:                                 FACILITY:  PHYSICIAN:  Leonides Grills, M.D.     DATE OF BIRTH:  [birth year]  DATE OF PROCEDURE:  03/19/2012 DATE OF DISCHARGE:                              OPERATIVE REPORT   PREOPERATIVE DIAGNOSIS:  Left anterior ankle impingement.  POSTOPERATIVE DIAGNOSIS:  Left anterior ankle impingement.  OPERATION:  Left ankle arthroscopy with extensive debridement.  ANESTHESIA:  General.  SURGEON:  Leonides Grills, M.D.  ASSISTANT:  Richardean Canal, PA-C  ESTIMATED BLOOD LOSS:  Minimum.  TOURNIQUET:  None.  COMPLICATIONS:  None.  DISPOSITION:  Stable to PR.  INDICATION:  This is a 51 year old female who was injured at work and has had persistent anterolateral left ankle pain that was interfering with her life to the point where she cannot do what she wants to despite conservative management.  She was consented for the above procedure. All risks of infection, nerve or vessel injury, persistent pain, worsening pain, prolonged recovery, stiffness, arthritis, wound healing problems, DVT, PE were all explained.  Questions were encouraged and answered.  OPERATION:  The patient was brought to the operating room and placed in supine position after adequate general anesthesia was administered as well as Ancef 1 g IV piggyback.  A bump was placed on the left ipsilateral hip, internally rotating the left lower extremity.  All bony prominences were well padded.  Left lower extremity was then prepped and draped in a sterile manner over proximally placed thigh tourniquet, which was not used.  Anatomical landmarks including anterior tibialis tendon and peroneus tertius were then palpated.  Due to the patient's obesity, this made the procedure more difficult to palpate the structures.  We could not see the superficial peroneal nerve.  Spinal needle was then placed  just medial to the anterior tibialis tendon.  A 20 mL of normal saline was instilled in the ankle.  Weston Brass and spread technique was then utilized to create the anteromedial portal medial to the anterior tibialis tendon.  Blunt tip trocar with cannula followed by camera was then placed in the ankle.  On direct visualization, the anterolateral portal was created with the spinal needle followed by nick and spread technique.  There was a tremendous amount of synovitis over the entire anterior anterolateral aspect of the ankle.  A robust accessory tib-fib ligament was rubbed in the anterolateral corner of the talar dome.  This synovitis extended into the lateral gutter as well as into the syndesmotic recess.  This was all extensively debrided with a shaver as well as radiofrequency bevel.  Hemostasis was obtained. Pictures were obtained throughout the procedure.  There was no obvious osteochondral lesion.  Camera was placed anterolaterally visualizing anteromedially.  Again, there was a large amount of synovitis over the entire anterior anteromedial aspect of the ankle.  This was also extensively debrided as well.  There was no obvious osteochondral lesions other than on the anterior medial aspect of the tibial plafond. There was a small area of synovitis that was removed from the edge of the tibia.  Pictures  were obtained throughout the procedure.  Camera was removed.  Wounds were closed with 4-0 nylon stitch.  Sterile dressing was applied.  CAM walker boot was applied.  The patient was stable to the PR.     Leonides Grills, M.D.     PB/MEDQ  D:  03/19/2012  T:  03/19/2012  Job:  [id number]

## 2012-04-10 ENCOUNTER — Emergency Department (HOSPITAL_COMMUNITY)
Admission: EM | Admit: 2012-04-10 | Discharge: 2012-04-10 | Disposition: A | Discharge status: Home / Self Care | Payer: BC Managed Care – PPO | Attending: Emergency Medicine | Admitting: Emergency Medicine

## 2012-04-10 ENCOUNTER — Encounter (HOSPITAL_COMMUNITY): Payer: Self-pay | Admitting: Emergency Medicine

## 2012-04-10 DIAGNOSIS — K219 Gastro-esophageal reflux disease without esophagitis: Secondary | ICD-10-CM | POA: Insufficient documentation

## 2012-04-10 DIAGNOSIS — Z9889 Other specified postprocedural states: Secondary | ICD-10-CM | POA: Insufficient documentation

## 2012-04-10 DIAGNOSIS — Z8614 Personal history of Methicillin resistant Staphylococcus aureus infection: Secondary | ICD-10-CM | POA: Insufficient documentation

## 2012-04-10 DIAGNOSIS — Z8709 Personal history of other diseases of the respiratory system: Secondary | ICD-10-CM | POA: Insufficient documentation

## 2012-04-10 DIAGNOSIS — M79609 Pain in unspecified limb: Secondary | ICD-10-CM | POA: Insufficient documentation

## 2012-04-10 DIAGNOSIS — M79605 Pain in left leg: Secondary | ICD-10-CM

## 2012-04-10 DIAGNOSIS — Z79899 Other long term (current) drug therapy: Secondary | ICD-10-CM | POA: Insufficient documentation

## 2012-04-10 DIAGNOSIS — Z7982 Long term (current) use of aspirin: Secondary | ICD-10-CM | POA: Insufficient documentation

## 2012-04-10 DIAGNOSIS — I10 Essential (primary) hypertension: Secondary | ICD-10-CM | POA: Insufficient documentation

## 2012-04-10 LAB — PROTIME-INR
INR: 0.92 (ref 0.00–1.49)
Prothrombin Time: 12.3 seconds (ref 11.6–15.2)

## 2012-04-10 LAB — POCT I-STAT, CHEM 8
Calcium, Ion: 1.27 mmol/L — ABNORMAL HIGH (ref 1.12–1.23)
Chloride: 107 mEq/L (ref 96–112)
HCT: 41 % (ref 36.0–46.0)
Hemoglobin: 13.9 g/dL (ref 12.0–15.0)
Potassium: 4 mEq/L (ref 3.5–5.1)

## 2012-04-10 LAB — CBC WITH DIFFERENTIAL/PLATELET
Lymphocytes Relative: 30 % (ref 12–46)
Lymphs Abs: 2.6 10*3/uL (ref 0.7–4.0)
Neutrophils Relative %: 60 % (ref 43–77)
Platelets: 348 10*3/uL (ref 150–400)
RBC: 4.75 MIL/uL (ref 3.87–5.11)
WBC: 8.8 10*3/uL (ref 4.0–10.5)

## 2012-04-10 LAB — D-DIMER, QUANTITATIVE: D-Dimer, Quant: 0.89 ug/mL-FEU — ABNORMAL HIGH (ref 0.00–0.48)

## 2012-04-10 NOTE — ED Notes (Signed)
Pt states the she has surgery to left foot on early December for inflammation from an injury; pt reports increase in pain x 2 days when standing and applying pressure to foot; + pedal pulse and + popliteal pulse; mild swelling noted to left foot; pt able to move foot but c/o soreness when upon doing so; pt reports that pain is in heel of foot and radiates up calf when stands.
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
## 2012-04-10 NOTE — ED Notes (Signed)
Pt presents with c/o left leg pain  Pt states he had ankle surgery Dec 4th  Pt states today she has pain running up her leg to her thigh and describes it as throbbing pain  Pt has swelling noted to the left leg Pain eases if leg is elevated

## 2012-04-11 ENCOUNTER — Emergency Department (HOSPITAL_COMMUNITY)
Admission: EM | Admit: 2012-04-11 | Discharge: 2012-04-11 | Disposition: A | Discharge status: Home / Self Care | Payer: BC Managed Care – PPO | Attending: Emergency Medicine | Admitting: Emergency Medicine

## 2012-04-11 ENCOUNTER — Encounter (HOSPITAL_COMMUNITY): Payer: Self-pay | Admitting: Emergency Medicine

## 2012-04-11 DIAGNOSIS — Z791 Long term (current) use of non-steroidal anti-inflammatories (NSAID): Secondary | ICD-10-CM | POA: Insufficient documentation

## 2012-04-11 DIAGNOSIS — M79605 Pain in left leg: Secondary | ICD-10-CM

## 2012-04-11 DIAGNOSIS — Z8614 Personal history of Methicillin resistant Staphylococcus aureus infection: Secondary | ICD-10-CM | POA: Insufficient documentation

## 2012-04-11 DIAGNOSIS — K219 Gastro-esophageal reflux disease without esophagitis: Secondary | ICD-10-CM | POA: Insufficient documentation

## 2012-04-11 DIAGNOSIS — J309 Allergic rhinitis, unspecified: Secondary | ICD-10-CM | POA: Insufficient documentation

## 2012-04-11 DIAGNOSIS — Z79899 Other long term (current) drug therapy: Secondary | ICD-10-CM | POA: Insufficient documentation

## 2012-04-11 DIAGNOSIS — I10 Essential (primary) hypertension: Secondary | ICD-10-CM | POA: Insufficient documentation

## 2012-04-11 DIAGNOSIS — Z7982 Long term (current) use of aspirin: Secondary | ICD-10-CM | POA: Insufficient documentation

## 2012-04-11 DIAGNOSIS — M79609 Pain in unspecified limb: Secondary | ICD-10-CM

## 2012-04-11 DIAGNOSIS — Z87828 Personal history of other (healed) physical injury and trauma: Secondary | ICD-10-CM | POA: Insufficient documentation

## 2012-04-11 NOTE — ED Notes (Signed)
Per patient started having left lower leg pain yesterday-had surgery on left foot 3 weeks ago
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
## 2012-04-11 NOTE — Progress Notes (Signed)
*  Preliminary Results* Left lower extremity venous duplex completed. Left lower extremity is negative for deep vein thrombosis. There is no evidence of a left Baker's cyst. Preliminary results discussed with Dr.Campos.  04/11/2012 9:33 AM Gertie Fey, RDMS, RDCS

## 2012-04-11 NOTE — ED Notes (Signed)
US at bedside

## 2012-04-29 ENCOUNTER — Ambulatory Visit (INDEPENDENT_AMBULATORY_CARE_PROVIDER_SITE_OTHER): Payer: BC Managed Care – PPO | Admitting: Internal Medicine

## 2012-04-29 ENCOUNTER — Encounter: Payer: Self-pay | Admitting: Internal Medicine

## 2012-04-29 VITALS — BP 135/89 | HR 98 | Temp 98.0°F | Wt 228.2 lb

## 2012-04-29 DIAGNOSIS — Z0000 Encounter for general adult medical examination without abnormal findings: Secondary | ICD-10-CM | POA: Insufficient documentation

## 2012-04-29 DIAGNOSIS — M25519 Pain in unspecified shoulder: Secondary | ICD-10-CM

## 2012-04-29 DIAGNOSIS — M25511 Pain in right shoulder: Secondary | ICD-10-CM | POA: Insufficient documentation

## 2012-04-29 MED ORDER — ACETAMINOPHEN 325 MG PO TABS: 650.0000 mg | ORAL_TABLET | Freq: Four times a day (QID) | ORAL | Status: DC | PRN

## 2012-04-29 NOTE — Assessment & Plan Note (Signed)
-  Patient refused flu shot today, will postpone. -Patient's mammogram, Pap smear, colonoscopy and tetanus vaccination are all up-to-date.

## 2012-04-29 NOTE — Patient Instructions (Signed)
1.Please start taking Tylenol from now as prescribed. Please take either ibuprofen or meloxicam, please do not take both of them at the same times. 2. Please take all medications as prescribed.  3. If you have worsening of your symptoms or new symptoms arise, please call the clinic ([phone]), or go to the ER immediately if symptoms are severe.

## 2012-04-29 NOTE — Assessment & Plan Note (Signed)
Patient's shoulder pain has been going on for more than one year. It has been worsening in the past 2 weeks. Currently physical examination does not have any signs of septic joint. There is a motion limitation secondary to pain. Patient denies any history of injury. The etiology is not clear. Differential diagnosis include rotator cuff tear, bursitis, capsulitis and osteoarthritis. Of note the patient had MRI done 06/18/08 which showed Moderate supraspinatus and infraspinatus tendinopathy; Mild subacromial subdeltoid bursitis. It is possible that patient could have similar issue on her right shoulder.   -will treat her with NSIADs and tylenol -will five her referral to sports medicine
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
## 2012-05-02 ENCOUNTER — Ambulatory Visit (INDEPENDENT_AMBULATORY_CARE_PROVIDER_SITE_OTHER): Payer: BC Managed Care – PPO | Admitting: Family Medicine

## 2012-05-02 VITALS — BP 145/91 | Ht 62.0 in | Wt 228.0 lb

## 2012-05-02 DIAGNOSIS — M259 Joint disorder, unspecified: Secondary | ICD-10-CM

## 2012-05-02 DIAGNOSIS — M19019 Primary osteoarthritis, unspecified shoulder: Secondary | ICD-10-CM

## 2012-05-02 NOTE — Progress Notes (Signed)
Chief complaint: Right shoulder pain  History of present illness: Right shoulder pain for 2 years. No specific injury. Pain worse with overhead activities and with reaching forward or pulling things for her. She's had increasing pain over the last 4-6 weeks. Denies any yet numbness or tingling in her fingers. No weakness in her arm. Pain is sharp mostly in the anterior shoulder radiates to her bicep muscle at times.    Physical exam Blood pressure 145/91, height 5\' 2"  (1.575 m), weight 228 lb (103.42 kg). General: No apparent distress alert and oriented x3 mood and affect somewhat normal. Patient is obese Respiratory: Patient's speak in full sentences and does not appear short of breath Skin: Warm dry intact with no signs of infection or rash Neuro: Cranial nerves II through XII are intact, neurovascularly intact in all extremities with 2+ DTRs and 2+ pulses. Right shoulder:  On inspection patient does not have any gross abnormalities. Patient is tender to palpation generalized over the shoulder. Patient does have near full passive range of motion. Patient does have a positive empty can as well as a positive crossover test. Patient has a negative O'Brien's. Patient's impingement signs are equivocal. She is neurovascularly intact distally.   ultrasound was performed and interpreted by me today. Patient does have calcific tendinitis of the subscapularis tendon. Patient has minimal hypoechoic changes surrounding the supraspinatus but intact. 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   Procedure: Under ultrasound guidance patient did have a calcific cost arthritic changes of the a.c. joinnt seen. After verbal and written consent patient was prepped to alcohol swabs and then did have injected 2 cc of Marcaine 0.5% and 1 cc of Kenalog-40 into the a.c. joint under ultrasound guidance. Patient tolerated the procedure very well. Patient was  prepped with Band-Aid afterwards.  Assessment: Shoulder pain with significant degenerative change in spurring at the a.c. joint. Today under or stand guidance we injected the a.c. joint.  PLAN: We'll start her on home exercise program and have her followup in 4-6 weeks.

## 2012-05-02 NOTE — Patient Instructions (Signed)
Very nice to meet you. You have arthritis of the a.c. joint. We are giving you some exercises to help your shoulder. I when she to come back in 6 weeks for further evaluation and we may need to repeat that injection.

## 2012-05-30 ENCOUNTER — Other Ambulatory Visit: Payer: Self-pay | Admitting: Internal Medicine

## 2012-06-11 ENCOUNTER — Encounter: Payer: Self-pay | Admitting: Internal Medicine

## 2012-06-11 ENCOUNTER — Ambulatory Visit (INDEPENDENT_AMBULATORY_CARE_PROVIDER_SITE_OTHER): Payer: BC Managed Care – PPO | Admitting: Internal Medicine

## 2012-06-11 VITALS — BP 126/81 | HR 93 | Temp 97.0°F | Resp 20 | Ht 62.5 in | Wt 225.5 lb

## 2012-06-11 DIAGNOSIS — I10 Essential (primary) hypertension: Secondary | ICD-10-CM

## 2012-06-11 DIAGNOSIS — E785 Hyperlipidemia, unspecified: Secondary | ICD-10-CM

## 2012-06-11 MED ORDER — ASPIRIN EC 81 MG PO TBEC: 81.0000 mg | DELAYED_RELEASE_TABLET | Freq: Every day | ORAL | Status: AC

## 2012-06-11 MED ORDER — HYDROCHLOROTHIAZIDE 25 MG PO TABS: ORAL_TABLET | ORAL | Status: DC

## 2012-06-11 NOTE — Assessment & Plan Note (Signed)
Patient will return later this week for lipid panel.

## 2012-06-11 NOTE — Patient Instructions (Signed)
General Instructions: -Keep taking care of your blood pressure, you're doing great!!  -Continue to eat healthy and work on weight loss as we discussed  Please be sure to bring all of your medications with you to every visit.  Should you have any new or worsening symptoms, please be sure to call the clinic at [phone].  Treatment Goals:  Goals (1 Years of Data) as of 06/11/12         As of Today 05/02/12 04/29/12 04/11/12 04/11/12     Blood Pressure    . Blood Pressure < 140/90  126/81 145/91 135/89 126/73 122/79     Result Component    . LDL CALC < 130            Progress Toward Treatment Goals:  Treatment Goal 06/11/2012  Blood pressure at goal    Self Care Goals & Plans:  Self Care Goal 06/11/2012  Manage my medications take my medicines as prescribed; bring my medications to every visit  Eat healthy foods eat more vegetables; eat baked foods instead of fried foods; eat smaller portions  Be physically active find an activity I enjoy
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
## 2012-06-11 NOTE — Assessment & Plan Note (Signed)
BP Readings from Last 3 Encounters:  06/11/12 126/81  05/02/12 145/91  04/29/12 135/89    Lab Results  Component Value Date   NA 143 04/10/2012   K 4.0 04/10/2012   CREATININE 1.10 04/10/2012    Assessment:  Blood pressure control: controlled  Progress toward BP goal:  at goal  Plan:  Medications:  continue current medications -hydrochlorothiazide 25 mg daily  Educational resources provided: video  Self management tools provided: home blood pressure logbook  Other plans: Encouraged her exercise and improve her diet so to foster weight loss

## 2012-06-13 ENCOUNTER — Ambulatory Visit (INDEPENDENT_AMBULATORY_CARE_PROVIDER_SITE_OTHER): Payer: BC Managed Care – PPO | Admitting: Family Medicine

## 2012-06-13 ENCOUNTER — Encounter: Payer: Self-pay | Admitting: Family Medicine

## 2012-06-13 VITALS — BP 130/88 | HR 99 | Ht 62.5 in | Wt 225.0 lb

## 2012-06-13 DIAGNOSIS — M19019 Primary osteoarthritis, unspecified shoulder: Secondary | ICD-10-CM

## 2012-06-13 DIAGNOSIS — M25511 Pain in right shoulder: Secondary | ICD-10-CM

## 2012-06-13 DIAGNOSIS — M25519 Pain in unspecified shoulder: Secondary | ICD-10-CM

## 2012-06-13 MED ORDER — NITROGLYCERIN 0.2 MG/HR TD PT24: MEDICATED_PATCH | TRANSDERMAL | Status: DC

## 2012-06-13 NOTE — Patient Instructions (Addendum)

## 2012-06-16 NOTE — Progress Notes (Signed)
  Subjective:    Patient ID: Danielle Mason, female    DOB: 1961/01/24, 52 y.o.   MRN: [id number]  HPI Followup right shoulder pain thought to be related at least partly to significant a.c. joint arthritis. Last office visit we did a corticosteroid injection under ultrasound guidance. She said that helped about 40% but she still having a lot of pain particularly at night. Occasionally keeps her from sleeping. She's had no numbness in her hand, no weakness in her shoulder or arm.   Review of Systems Denies fever, sweats, chills.    Objective:   Physical Exam  Vital signs are reviewed GENERAL: Well-developed female slightly overweight no acute distress SHOULDER: Right. Tender to palpation over the a.c. joint. O'Brien's test is positive. Full range of motion in all planes of the rotator cuff. She has some pain with supraspinatus testing but it's unclear whether this is related to supraspinatus tendon or to her a.c. joint area.      Assessment & Plan:  #1. Right shoulder pain long-standing. I think we have made some progress with the a.c. joint injection. She has significant arthropathy by ultrasound. Given her continued symptoms and the long-standing nature of this I think we will try proceeding with nitroglycerin patch. We discussed how to use that now see her back in 3-4 weeks.

## 2012-06-25 ENCOUNTER — Other Ambulatory Visit: Payer: Self-pay | Admitting: *Deleted

## 2012-06-25 MED ORDER — NITROGLYCERIN 0.2 MG/HR TD PT24: MEDICATED_PATCH | TRANSDERMAL | Status: DC

## 2012-07-07 ENCOUNTER — Ambulatory Visit: Payer: BC Managed Care – PPO | Admitting: Family Medicine

## 2012-08-15 ENCOUNTER — Ambulatory Visit (INDEPENDENT_AMBULATORY_CARE_PROVIDER_SITE_OTHER): Payer: BC Managed Care – PPO | Admitting: Family Medicine

## 2012-08-15 ENCOUNTER — Encounter: Payer: Self-pay | Admitting: Family Medicine

## 2012-08-15 VITALS — BP 150/105 | Ht 62.0 in | Wt 230.0 lb

## 2012-08-15 DIAGNOSIS — M19019 Primary osteoarthritis, unspecified shoulder: Secondary | ICD-10-CM

## 2012-08-15 NOTE — Progress Notes (Signed)
  Subjective:    Patient ID: Danielle Mason, female    DOB: October 10, 1960, 52 y.o.   MRN: [id number]  HPI  Followup right shoulder pain. Has used a nitroglycerin patch without improvement. She has continued to try the exercises which does seem to make her shoulder worse. She is quite frustrated. She has been out of work all only for some foot surgery she had and is scheduled to go back next week. She is concerned because prior to going out for foot surgery she was having increased shoulder pain with certain activities at work. We have given her corticosteroid injection and we have tried her on home exercise program. Review of Systems Denies fever, sweats, chills. No hand numbness.    Objective:   Physical Exam  Vital signs are reviewed GENERAL: Well-developed female no acute distress overweight. Shoulder: Right. Full range of motion in all planes of the rotator cuff. She has intact strength the pain limits her overhead extension to about 100 in the frontal plane in about 110 in the lateral plane. Difficulty with lift off test a.c. 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 I don't think conservatively there's anything else I can offer her. I did review her prior imaging studies which showed an unfavorable acromium type. Potentially she could benefit from acromioplasty. We'll be happy set her up with orthopedist for further eval. She wanted a note for work. I don't think she is going to be able to do any overhead lifting greater than 10 pounds without significant pain. She's also in the past used a pressure washer at work which I doubt she'll be able to do. I did tell her that rating her out from these activities major revised her drop. I have given her a letter, copy in the chart.

## 2012-08-15 NOTE — Patient Instructions (Addendum)
Dr Kristeen [facility] [address] [place] [phone] Wednesday May 7th at 1:30pm

## 2012-10-20 ENCOUNTER — Other Ambulatory Visit: Payer: Self-pay | Admitting: Internal Medicine

## 2012-10-21 NOTE — Telephone Encounter (Signed)
Refill request for ibuprofin

## 2012-10-28 ENCOUNTER — Encounter (HOSPITAL_COMMUNITY): Payer: Self-pay | Admitting: Emergency Medicine

## 2012-10-28 ENCOUNTER — Emergency Department (HOSPITAL_COMMUNITY)
Admission: EM | Admit: 2012-10-28 | Discharge: 2012-10-28 | Disposition: A | Discharge status: Home / Self Care | Payer: BC Managed Care – PPO | Attending: Emergency Medicine | Admitting: Emergency Medicine

## 2012-10-28 DIAGNOSIS — Z8614 Personal history of Methicillin resistant Staphylococcus aureus infection: Secondary | ICD-10-CM | POA: Insufficient documentation

## 2012-10-28 DIAGNOSIS — Y9389 Activity, other specified: Secondary | ICD-10-CM | POA: Insufficient documentation

## 2012-10-28 DIAGNOSIS — Z7982 Long term (current) use of aspirin: Secondary | ICD-10-CM | POA: Insufficient documentation

## 2012-10-28 DIAGNOSIS — Z8719 Personal history of other diseases of the digestive system: Secondary | ICD-10-CM | POA: Insufficient documentation

## 2012-10-28 DIAGNOSIS — Y92410 Unspecified street and highway as the place of occurrence of the external cause: Secondary | ICD-10-CM | POA: Insufficient documentation

## 2012-10-28 DIAGNOSIS — I10 Essential (primary) hypertension: Secondary | ICD-10-CM | POA: Insufficient documentation

## 2012-10-28 DIAGNOSIS — T148XXA Other injury of unspecified body region, initial encounter: Secondary | ICD-10-CM

## 2012-10-28 DIAGNOSIS — Z87828 Personal history of other (healed) physical injury and trauma: Secondary | ICD-10-CM | POA: Insufficient documentation

## 2012-10-28 MED ORDER — CYCLOBENZAPRINE HCL 10 MG PO TABS: 10.0000 mg | ORAL_TABLET | Freq: Two times a day (BID) | ORAL | Status: DC | PRN

## 2012-10-28 MED ORDER — IBUPROFEN 400 MG PO TABS
600.0000 mg | ORAL_TABLET | Freq: Once | ORAL | Status: AC
  Administered 2012-10-28: 600 mg via ORAL
  Filled 2012-10-28: qty 1

## 2012-10-28 MED ORDER — CYCLOBENZAPRINE HCL 10 MG PO TABS
10.0000 mg | ORAL_TABLET | Freq: Once | ORAL | Status: AC
  Administered 2012-10-28: 10 mg via ORAL
  Filled 2012-10-28: qty 1

## 2012-10-28 MED ORDER — IBUPROFEN 600 MG PO TABS: 600.0000 mg | ORAL_TABLET | Freq: Four times a day (QID) | ORAL | Status: DC | PRN
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
## 2012-10-31 NOTE — ED Provider Notes (Signed)
Medical screening examination/treatment/procedure(s) were performed by non-physician practitioner and as supervising physician I was immediately available for consultation/collaboration.   Rodrick Payson M Fareedah Mahler, DO 10/31/12 0132 

## 2012-11-04 ENCOUNTER — Ambulatory Visit (INDEPENDENT_AMBULATORY_CARE_PROVIDER_SITE_OTHER): Payer: BC Managed Care – PPO | Admitting: Internal Medicine

## 2012-11-04 ENCOUNTER — Ambulatory Visit (HOSPITAL_COMMUNITY)
Admission: RE | Admit: 2012-11-04 | Discharge: 2012-11-04 | Disposition: A | Discharge status: Home / Self Care | Payer: BC Managed Care – PPO | Source: Ambulatory Visit | Attending: Internal Medicine | Admitting: Internal Medicine

## 2012-11-04 ENCOUNTER — Encounter: Payer: Self-pay | Admitting: Internal Medicine

## 2012-11-04 DIAGNOSIS — R609 Edema, unspecified: Secondary | ICD-10-CM | POA: Insufficient documentation

## 2012-11-04 DIAGNOSIS — M79609 Pain in unspecified limb: Secondary | ICD-10-CM

## 2012-11-04 DIAGNOSIS — R600 Localized edema: Secondary | ICD-10-CM

## 2012-11-04 DIAGNOSIS — K219 Gastro-esophageal reflux disease without esophagitis: Secondary | ICD-10-CM

## 2012-11-04 DIAGNOSIS — M7989 Other specified soft tissue disorders: Secondary | ICD-10-CM

## 2012-11-04 DIAGNOSIS — I10 Essential (primary) hypertension: Secondary | ICD-10-CM

## 2012-11-04 MED ORDER — HYDROCODONE-ACETAMINOPHEN 5-325 MG PO TABS: 1.0000 | ORAL_TABLET | Freq: Four times a day (QID) | ORAL | Status: DC | PRN

## 2012-11-04 MED ORDER — OMEPRAZOLE 20 MG PO CPDR: 20.0000 mg | DELAYED_RELEASE_CAPSULE | Freq: Every day | ORAL | Status: DC

## 2012-11-04 NOTE — Progress Notes (Signed)
Bilateral lower extremity venous duplex:  No evidence of DVT, superficial thrombosis, or Baker's Cyst.   
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
## 2012-11-04 NOTE — Assessment & Plan Note (Signed)
BP Readings from Last 3 Encounters:  11/04/12 114/79  10/28/12 121/85  08/15/12 150/105   Lab Results  Component Value Date   NA 143 04/10/2012   K 4.0 04/10/2012   CREATININE 1.10 04/10/2012    Assessment: Blood pressure control: controlled Progress toward BP goal:  at goal  Plan: Medications:  continue current medications hctz 25mg   Educational resources provided: brochure Self management tools provided: home blood pressure logbook

## 2012-11-06 ENCOUNTER — Other Ambulatory Visit (INDEPENDENT_AMBULATORY_CARE_PROVIDER_SITE_OTHER): Payer: BC Managed Care – PPO

## 2012-11-06 DIAGNOSIS — E785 Hyperlipidemia, unspecified: Secondary | ICD-10-CM

## 2012-11-06 LAB — LIPID PANEL
Cholesterol: 203 mg/dL — ABNORMAL HIGH (ref 0–200)
LDL Cholesterol: 136 mg/dL — ABNORMAL HIGH (ref 0–99)
VLDL: 32 mg/dL (ref 0–40)

## 2012-11-07 ENCOUNTER — Ambulatory Visit (INDEPENDENT_AMBULATORY_CARE_PROVIDER_SITE_OTHER): Payer: Self-pay | Admitting: Family Medicine

## 2012-11-07 ENCOUNTER — Encounter: Payer: Self-pay | Admitting: Family Medicine

## 2012-11-07 VITALS — BP 148/92 | HR 100 | Ht 62.0 in | Wt 229.0 lb

## 2012-11-07 DIAGNOSIS — M25512 Pain in left shoulder: Secondary | ICD-10-CM

## 2012-11-07 DIAGNOSIS — M25519 Pain in unspecified shoulder: Secondary | ICD-10-CM | POA: Insufficient documentation

## 2012-11-07 LAB — COMPREHENSIVE METABOLIC PANEL
ALT: 12 U/L (ref 0–35)
AST: 12 U/L (ref 0–37)
CO2: 25 mEq/L (ref 19–32)
Sodium: 140 mEq/L (ref 135–145)
Total Bilirubin: 0.5 mg/dL (ref 0.3–1.2)
Total Protein: 6.8 g/dL (ref 6.0–8.3)
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
## 2012-11-07 NOTE — Progress Notes (Signed)
Case discussed with Dr. Qureshi at the time of the visit.  We reviewed the resident's history and exam and pertinent patient test results.  I agree with the assessment, diagnosis, and plan of care documented in the resident's note. 

## 2012-11-12 ENCOUNTER — Ambulatory Visit (INDEPENDENT_AMBULATORY_CARE_PROVIDER_SITE_OTHER): Payer: BC Managed Care – PPO | Admitting: Internal Medicine

## 2012-11-12 ENCOUNTER — Encounter: Payer: Self-pay | Admitting: Internal Medicine

## 2012-11-12 VITALS — BP 126/81 | HR 101 | Temp 97.0°F | Wt 231.5 lb

## 2012-11-12 DIAGNOSIS — R040 Epistaxis: Secondary | ICD-10-CM

## 2012-11-12 DIAGNOSIS — K219 Gastro-esophageal reflux disease without esophagitis: Secondary | ICD-10-CM

## 2012-11-12 DIAGNOSIS — I10 Essential (primary) hypertension: Secondary | ICD-10-CM

## 2012-11-12 DIAGNOSIS — E785 Hyperlipidemia, unspecified: Secondary | ICD-10-CM

## 2012-11-12 DIAGNOSIS — M25519 Pain in unspecified shoulder: Secondary | ICD-10-CM

## 2012-11-12 DIAGNOSIS — M25511 Pain in right shoulder: Secondary | ICD-10-CM

## 2012-11-12 LAB — GLUCOSE, CAPILLARY: Glucose-Capillary: 168 mg/dL — ABNORMAL HIGH (ref 70–99)

## 2012-11-12 MED ORDER — IBUPROFEN 600 MG PO TABS: 600.0000 mg | ORAL_TABLET | Freq: Four times a day (QID) | ORAL | Status: DC | PRN

## 2012-11-12 MED ORDER — SALINE SPRAY 0.65 % NA SOLN: 2.0000 | Freq: Two times a day (BID) | NASAL | Status: DC

## 2012-11-12 MED ORDER — OMEPRAZOLE 20 MG PO CPDR: 20.0000 mg | DELAYED_RELEASE_CAPSULE | Freq: Every day | ORAL | Status: DC

## 2012-11-12 NOTE — Assessment & Plan Note (Signed)
10 year risk 2%, encouraged lifestyle modification.
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
## 2012-11-12 NOTE — Assessment & Plan Note (Signed)
BP Readings from Last 3 Encounters:  11/12/12 126/81  11/07/12 148/92  11/04/12 114/79    Lab Results  Component Value Date   NA 140 11/06/2012   K 4.3 11/06/2012   CREATININE 0.77 11/06/2012    Assessment: Blood pressure control: controlled Progress toward BP goal:  at goal  Plan: Medications:  continue current medications - HCTZ 25 Educational resources provided: brochure;video Self management tools provided: home blood pressure logbook

## 2012-11-12 NOTE — Patient Instructions (Addendum)
General Instructions: -Your blood pressure looks great - keep taking you blood pressure pills -I refilled your acid reducing medication -I refilled ibuprofen 600, which you can take 3 times a day for joint pain; Per Dr. Jennette Kettle from sports medicine, you would benefit from evaluation from an orthopedist.  You can schedule this at [facility] orthopedics.  -Your nasal mucosa appears dry. For your nose bleed, try to use nasal saline, and if this problem does not resolve in 1-2 weeks, please schedule a doctor's appointment  Please be sure to bring all of your medications with you to every visit.  Should you have any new or worsening symptoms, please be sure to call the clinic at [phone].  Treatment Goals:  Goals (1 Years of Data) as of 11/12/12         As of Today 11/07/12 11/06/12 11/04/12 10/28/12     Blood Pressure    . Blood Pressure < 140/90  126/81 148/92  114/79 121/85     Result Component    . LDL CALC < 130    136        Progress Toward Treatment Goals:  Treatment Goal 11/12/2012  Blood pressure at goal    Self Care Goals & Plans:  Self Care Goal 11/12/2012  Manage my medications take my medicines as prescribed; refill my medications on time  Eat healthy foods eat foods that are low in salt; eat baked foods instead of fried foods  Be physically active find an activity I enjoy

## 2012-11-13 NOTE — Addendum Note (Signed)
Addended by: Belia Heman on: 11/13/2012 09:52 PM   Modules accepted: Orders

## 2012-11-13 NOTE — Assessment & Plan Note (Signed)
Not on blood thinners except ASA 81, which is not likely to cause epistaxis in this patient.  Nasal mucosa dry on exam.  Trial of ocean nose spray - return if no improvement.

## 2012-11-13 NOTE — Assessment & Plan Note (Signed)
Refilled NSAID.  She has been following with sports medicine for rotator cuff tendinopathy, who believes she would benefit from ortho eval.  Referral to ortho.

## 2012-11-13 NOTE — Assessment & Plan Note (Signed)
Refilled PPI 

## 2012-11-14 NOTE — Progress Notes (Signed)
Case discussed with Dr. Sharda at the time of the visit.  We reviewed the resident's history and exam and pertinent patient test results.  I agree with the assessment, diagnosis, and plan of care documented in the resident's note.    

## 2013-01-08 ENCOUNTER — Other Ambulatory Visit: Payer: Self-pay | Admitting: *Deleted

## 2013-01-08 DIAGNOSIS — M25511 Pain in right shoulder: Secondary | ICD-10-CM

## 2013-01-09 MED ORDER — IBUPROFEN 600 MG PO TABS: 600.0000 mg | ORAL_TABLET | Freq: Four times a day (QID) | ORAL | Status: DC | PRN

## 2013-01-16 ENCOUNTER — Encounter (HOSPITAL_COMMUNITY): Payer: Self-pay | Admitting: *Deleted

## 2013-01-16 DIAGNOSIS — Z7982 Long term (current) use of aspirin: Secondary | ICD-10-CM | POA: Insufficient documentation

## 2013-01-16 DIAGNOSIS — Z79899 Other long term (current) drug therapy: Secondary | ICD-10-CM | POA: Insufficient documentation

## 2013-01-16 DIAGNOSIS — Z87891 Personal history of nicotine dependence: Secondary | ICD-10-CM | POA: Insufficient documentation

## 2013-01-16 DIAGNOSIS — Z880 Allergy status to penicillin: Secondary | ICD-10-CM | POA: Insufficient documentation

## 2013-01-16 DIAGNOSIS — M25569 Pain in unspecified knee: Secondary | ICD-10-CM | POA: Insufficient documentation

## 2013-01-16 DIAGNOSIS — Z91040 Latex allergy status: Secondary | ICD-10-CM | POA: Insufficient documentation

## 2013-01-16 DIAGNOSIS — K219 Gastro-esophageal reflux disease without esophagitis: Secondary | ICD-10-CM | POA: Insufficient documentation

## 2013-01-16 DIAGNOSIS — Z8614 Personal history of Methicillin resistant Staphylococcus aureus infection: Secondary | ICD-10-CM | POA: Insufficient documentation

## 2013-01-16 DIAGNOSIS — I10 Essential (primary) hypertension: Secondary | ICD-10-CM | POA: Insufficient documentation
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
## 2013-01-17 ENCOUNTER — Emergency Department (HOSPITAL_COMMUNITY): Payer: No Typology Code available for payment source

## 2013-01-17 ENCOUNTER — Emergency Department (HOSPITAL_COMMUNITY)
Admission: EM | Admit: 2013-01-17 | Discharge: 2013-01-17 | Disposition: A | Discharge status: Home / Self Care | Payer: No Typology Code available for payment source | Attending: Emergency Medicine | Admitting: Emergency Medicine

## 2013-01-17 DIAGNOSIS — M25562 Pain in left knee: Secondary | ICD-10-CM

## 2013-01-17 MED ORDER — IBUPROFEN 400 MG PO TABS
600.0000 mg | ORAL_TABLET | Freq: Once | ORAL | Status: AC
  Administered 2013-01-17: 600 mg via ORAL
  Filled 2013-01-17 (×2): qty 1
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
## 2013-01-17 NOTE — ED Notes (Signed)
Returned from radiology. 

## 2013-01-17 NOTE — ED Notes (Signed)
Pt refused crutches stating she has some at home and has worn them in the past

## 2013-02-11 ENCOUNTER — Telehealth: Payer: Self-pay | Admitting: *Deleted

## 2013-02-11 NOTE — Telephone Encounter (Signed)
LEFT VOICE MESSAGE FOR PATIENT TO CALL. NEED TO KNOW  IF ORTHO REFERRAL IS FOR [facility]. BEFORE THEY WILL GIVE APPOINTMENT. LELA STURDIVANT NTII 10-29-014

## 2013-07-14 ENCOUNTER — Encounter: Payer: Self-pay | Admitting: Internal Medicine

## 2013-07-14 ENCOUNTER — Ambulatory Visit (INDEPENDENT_AMBULATORY_CARE_PROVIDER_SITE_OTHER): Payer: BC Managed Care – PPO | Admitting: Internal Medicine

## 2013-07-14 ENCOUNTER — Ambulatory Visit: Payer: BC Managed Care – PPO | Admitting: Internal Medicine

## 2013-07-14 VITALS — BP 134/81 | HR 70 | Temp 97.0°F | Ht 62.0 in | Wt 234.0 lb

## 2013-07-14 DIAGNOSIS — E119 Type 2 diabetes mellitus without complications: Secondary | ICD-10-CM | POA: Insufficient documentation

## 2013-07-14 DIAGNOSIS — R7301 Impaired fasting glucose: Secondary | ICD-10-CM | POA: Insufficient documentation

## 2013-07-14 DIAGNOSIS — R7303 Prediabetes: Secondary | ICD-10-CM

## 2013-07-14 DIAGNOSIS — R7309 Other abnormal glucose: Secondary | ICD-10-CM

## 2013-07-14 DIAGNOSIS — I10 Essential (primary) hypertension: Secondary | ICD-10-CM

## 2013-07-14 DIAGNOSIS — Z8639 Personal history of other endocrine, nutritional and metabolic disease: Secondary | ICD-10-CM

## 2013-07-14 DIAGNOSIS — R5381 Other malaise: Secondary | ICD-10-CM

## 2013-07-14 DIAGNOSIS — E785 Hyperlipidemia, unspecified: Secondary | ICD-10-CM

## 2013-07-14 DIAGNOSIS — R5383 Other fatigue: Secondary | ICD-10-CM

## 2013-07-14 DIAGNOSIS — E669 Obesity, unspecified: Secondary | ICD-10-CM | POA: Insufficient documentation

## 2013-07-14 LAB — BASIC METABOLIC PANEL WITH GFR
BUN: 14 mg/dL (ref 6–23)
CALCIUM: 9.7 mg/dL (ref 8.4–10.5)
CO2: 26 mEq/L (ref 19–32)
CREATININE: 0.69 mg/dL (ref 0.50–1.10)
Chloride: 102 mEq/L (ref 96–112)
GFR, Est African American: >89 mL/min
Glucose, Bld: 93 mg/dL (ref 70–99)
Potassium: 4 mEq/L (ref 3.5–5.3)
Sodium: 137 mEq/L (ref 135–145)

## 2013-07-14 LAB — HEMOGLOBIN A1C
Hgb A1c MFr Bld: 6.3 % — ABNORMAL HIGH (ref <5.7)
Mean Plasma Glucose: 134 mg/dL — ABNORMAL HIGH (ref <117)

## 2013-07-14 LAB — HIV ANTIBODY (ROUTINE TESTING W REFLEX): HIV: NONREACTIVE

## 2013-07-14 LAB — TSH: TSH: 0.883 u[IU]/mL (ref 0.350–4.500)

## 2013-07-14 MED ORDER — IBUPROFEN 600 MG PO TABS: 600.0000 mg | ORAL_TABLET | Freq: Four times a day (QID) | ORAL | Status: DC | PRN

## 2013-07-14 MED ORDER — HYDROCHLOROTHIAZIDE 25 MG PO TABS: 25.0000 mg | ORAL_TABLET | Freq: Every morning | ORAL | Status: DC

## 2013-07-14 NOTE — Assessment & Plan Note (Signed)
-  Check TSH - Will check HIV patient has not had testing since 2008 and is agreeable.

## 2013-07-14 NOTE — Assessment & Plan Note (Addendum)
Pt weight has steadly increased over the past few years.  She reports desire to loose weight and increase exercise. -Will refer to our dietician and life coach Debera Lat for MNT. -Check TSH

## 2013-07-14 NOTE — Assessment & Plan Note (Signed)
Last A1c 6.4. Will check HgbA1c today, patient should be monitored for development of diabetes on yearly basis. -MNT referral

## 2013-07-14 NOTE — Patient Instructions (Signed)
I will call with lab results. Remember to use compression stockings and eat a low sodium diet.  Sodium-Controlled Diet Sodium is a mineral. It is found in many foods. Sodium may be found naturally or added during the making of a food. The most common form of sodium is salt, which is made up of sodium and chloride. Reducing your sodium intake involves changing your eating habits. The following guidelines will help you reduce the sodium in your diet:  Stop using the salt shaker.  Use salt sparingly in cooking and baking.  Substitute with sodium-free seasonings and spices.  Do not use a salt substitute (potassium chloride) without your caregiver's permission.  Include a variety of fresh, unprocessed foods in your diet.  Limit the use of processed and convenience foods that are high in sodium. USE THE FOLLOWING FOODS SPARINGLY: Breads/Starches  Commercial bread stuffing, commercial pancake or waffle mixes, coating mixes. Waffles. Croutons. Prepared (boxed or frozen) potato, rice, or noodle mixes that contain salt or sodium. Salted [place] fries or hash browns. Salted popcorn, breads, crackers, chips, or snack foods. Vegetables  Vegetables canned with salt or prepared in cream, butter, or cheese sauces. Sauerkraut. Tomato or vegetable juices canned with salt.  Fresh vegetables are allowed if rinsed thoroughly. Fruit  Fruit is okay to eat. Meat and Meat Substitutes  Salted or smoked meats, such as bacon or Canadian bacon, chipped or corned beef, hot dogs, salt pork, luncheon meats, pastrami, ham, or sausage. Canned or smoked fish, poultry, or meat. Processed cheese or cheese spreads, blue or Roquefort cheese. Battered or frozen fish products. Prepared spaghetti sauce. Baked beans. Reuben sandwiches. Salted nuts. Caviar. Milk  Limit buttermilk to 1 cup per week. Soups and Combination Foods  Bouillon cubes, canned or dried soups, broth, consomm. Convenience (frozen or packaged) dinners  with more than 600 mg sodium. Pot pies, pizza, Asian food, fast food cheeseburgers, and specialty sandwiches. Desserts and Sweets  Regular (salted) desserts, pie, commercial fruit snack pies, commercial snack cakes, canned puddings.  Eat desserts and sweets in moderation. Fats and Oils  Gravy mixes or canned gravy. No more than 1 to 2 tbs of salad dressing. Chip dips.  Eat fats and oils in moderation. Beverages  See those listed under the vegetables and milk groups. Condiments  Ketchup, mustard, meat sauces, salsa, regular (salted) and lite soy sauce or mustard. Dill pickles, olives, meat tenderizer. Prepared horseradish or pickle relish. Dutch-processed cocoa. Baking powder or baking soda used medicinally. Worcestershire sauce. "Light" salt. Salt substitute, unless approved by your caregiver. Document Released: 09/22/2001 Document Revised: 06/25/2011 Document Reviewed: 04/25/2009 [facility] Patient Information 2014 [employer], [place].

## 2013-07-14 NOTE — Assessment & Plan Note (Signed)
10 year risk calculated at 3% no indication for medical therapy at this time.
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
## 2013-07-14 NOTE — Assessment & Plan Note (Signed)
BP Readings from Last 3 Encounters:  07/14/13 134/81  01/17/13 117/78  11/12/12 126/81    Lab Results  Component Value Date   NA 140 11/06/2012   K 4.3 11/06/2012   CREATININE 0.77 11/06/2012    Assessment: Blood pressure control: controlled Progress toward BP goal:  at goal Comments:   Plan: Medications:  continue current medications  HCTZ 25mg  daily Educational resources provided: handout Self management tools provided:   Other plans: BMP

## 2013-08-06 NOTE — Progress Notes (Signed)
Case discussed with Dr. Hoffman soon after the resident saw the patient.  We reviewed the resident's history and exam and pertinent patient test results.  I agree with the assessment, diagnosis, and plan of care documented in the resident's note. 

## 2013-08-18 ENCOUNTER — Ambulatory Visit (INDEPENDENT_AMBULATORY_CARE_PROVIDER_SITE_OTHER): Payer: BC Managed Care – PPO | Admitting: Dietician

## 2013-08-18 VITALS — Wt 229.4 lb

## 2013-08-18 DIAGNOSIS — R7303 Prediabetes: Secondary | ICD-10-CM

## 2013-08-18 NOTE — Progress Notes (Signed)
Medical Nutrition Therapy:  Appt start time: 1050 end time:  1125.  Assessment:  Primary concerns today: Weight management and prediabetes.  [name] presents for weight loss and help in preventing type 2 diabetes. Her BMI ~42 and her A1C is 6.3%. She reports that her weight gain came from increased hunger she experienced while on Prozac a few years ago, Usual eating pattern includes 3 meals and 1-2 snacks per day. Eats out often at cook out- double thick burger with seasoned fries, [employer]- fried shrimp and oysters, Frequent foods include fried foods fairly often. Fat back in some vegetables, oatmeal, recently decreased regalar soda consumption.  Avoided foods include allergic to fish, throat swells, but eats fried salmon once a month.   Usual physical activity includes walks while shopping and stays busy most of day watches tv for about 2 hours a day. No formal exercise regimen  Estimated needs to maintain her current weight ~ 2100-2200 calories/day, for loss 1800 to 2000 with increased protein to minimize lean tissue loss Progress Towards Goal(s):  In progress.   Nutritional Diagnosis:  NB-1.1 Food and nutrition-related knowledge deficit As related to lack of prior knowledge about weight control/loss.  As evidenced by her questions.    Intervention:  Nutrition education.  Monitoring/Evaluation:  Dietary intake, exercise, and body weight in 3 week(s).

## 2013-08-18 NOTE — Patient Instructions (Signed)
Try to eat less calories by eating smaller portions to help me decrease my weight.  See you in 3 weeks

## 2013-09-01 ENCOUNTER — Other Ambulatory Visit: Payer: Self-pay | Admitting: Internal Medicine

## 2013-09-01 ENCOUNTER — Other Ambulatory Visit: Payer: Self-pay | Admitting: *Deleted

## 2013-09-01 NOTE — Telephone Encounter (Signed)
Meds are already being address by other triage nurse. Hilda Blades Jurnee Nakayama RN 09/01/13 9:50AM

## 2013-09-08 ENCOUNTER — Encounter: Payer: Self-pay | Admitting: Internal Medicine

## 2013-09-08 ENCOUNTER — Ambulatory Visit (INDEPENDENT_AMBULATORY_CARE_PROVIDER_SITE_OTHER): Payer: BC Managed Care – PPO | Admitting: Dietician

## 2013-09-08 ENCOUNTER — Ambulatory Visit (INDEPENDENT_AMBULATORY_CARE_PROVIDER_SITE_OTHER): Payer: BC Managed Care – PPO | Admitting: Internal Medicine

## 2013-09-08 ENCOUNTER — Encounter: Payer: Self-pay | Admitting: Dietician

## 2013-09-08 VITALS — BP 128/80 | HR 85 | Temp 97.1°F | Wt 228.9 lb

## 2013-09-08 DIAGNOSIS — M722 Plantar fascial fibromatosis: Secondary | ICD-10-CM

## 2013-09-08 DIAGNOSIS — Z0000 Encounter for general adult medical examination without abnormal findings: Secondary | ICD-10-CM

## 2013-09-08 DIAGNOSIS — R7309 Other abnormal glucose: Secondary | ICD-10-CM

## 2013-09-08 DIAGNOSIS — R7303 Prediabetes: Secondary | ICD-10-CM

## 2013-09-08 DIAGNOSIS — I872 Venous insufficiency (chronic) (peripheral): Secondary | ICD-10-CM | POA: Insufficient documentation

## 2013-09-08 DIAGNOSIS — I10 Essential (primary) hypertension: Secondary | ICD-10-CM

## 2013-09-08 MED ORDER — IBUPROFEN 600 MG PO TABS: 600.0000 mg | ORAL_TABLET | Freq: Three times a day (TID) | ORAL | Status: DC | PRN

## 2013-09-08 NOTE — Progress Notes (Signed)
Medical Nutrition Therapy:  Appt start time: 5916 end time:  1100.  Assessment:  Primary concerns today: Weight management and prediabetes.  [name] has lost ~ 5# since 07/14/13, 1# since 08/18/13 by eating breakfast like oatmeal or salmon. Eggs and grits instead of drinking a mountain Dew and snickers bar. Cookong with less fat back and eating less fried foods.  Usual physical activity include  Progress Towards Goal(s):  Some progress.   Nutritional Diagnosis:  NB-1.1 Food and nutrition-related knowledge deficit As related to lack of prior knowledge about weight control/loss imrpoving  As evidenced by her questions.    Intervention:  Nutrition education about goal setting, Type 2 diabetes and proper foot wear for walking/activity.    Monitoring/Evaluation:  Dietary intake, exercise, and body weight in 3 week(s).

## 2013-09-08 NOTE — Assessment & Plan Note (Signed)
Educated on Chemical engineer.  Refilled ibuprofen (also for right shoulder pain, s/p surgery)

## 2013-09-08 NOTE — Assessment & Plan Note (Signed)
Mammo scheduled for September 17, 2013.
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
## 2013-09-08 NOTE — Assessment & Plan Note (Signed)
5 lb weight loss since last visit, doing great with lifestyle modification.  Has met with Donna Plyler.  Annual screening. 

## 2013-09-08 NOTE — Assessment & Plan Note (Signed)
Encouraged use of compression stockings while walking and leg elevation at night.

## 2013-09-08 NOTE — Patient Instructions (Signed)
Please make a follow up appointment for 3-4 weeks  Your goal is to continue to eat breakfast ( instead of drinking your diet mountain Dew)  Eat lunch and smaller portions.

## 2013-09-08 NOTE — Patient Instructions (Signed)
General Instructions: -You are doing great with your lifestyle modification, keep it up, you've lost 5 lbs since March!!!!    -Your blood pressure looks perfect - keep taking hydrochlorothiazide 25mg  daily!  We will check your cholesterol next time  -Thanks for scheduling your mammogram for June 4th  - I refilled your ibuprofen for your feet, don't forget about the tennis ball exercise  -You are due for your pap smear next year!  Please try to bring all your medicines next time. This will help [place] keep you safe from mistakes.  Should you have any new or worsening symptoms, please be sure to call the clinic at [phone].   Progress Toward Treatment Goals:  Treatment Goal 09/08/2013  Blood pressure at goal    Self Care Goals & Plans:  Self Care Goal 07/14/2013  Manage my medications take my medicines as prescribed; bring my medications to every visit  Eat healthy foods eat baked foods instead of fried foods; eat foods that are low in salt  Be physically active find an activity I enjoy; take a walk every day    Care Management & Community Referrals:  Referral 07/14/2013  Referrals made for care management support nutritionist    Plantar Fasciitis Plantar fasciitis is a common condition that causes foot pain. It is soreness (inflammation) of the band of tough fibrous tissue on the bottom of the foot that runs from the heel bone (calcaneus) to the ball of the foot. The cause of this soreness may be from excessive standing, poor fitting shoes, running on hard surfaces, being overweight, having an abnormal walk, or overuse (this is common in runners) of the painful foot or feet. It is also common in aerobic exercise dancers and ballet dancers. SYMPTOMS  Most people with plantar fasciitis complain of:  Severe pain in the morning on the bottom of their foot especially when taking the first steps out of bed. This pain recedes after a few minutes of walking.  Severe pain is experienced also  during walking following a long period of inactivity.  Pain is worse when walking barefoot or up stairs DIAGNOSIS   Your caregiver will diagnose this condition by examining and feeling your foot.  Special tests such as X-rays of your foot, are usually not needed. PREVENTION   Consult a sports medicine professional before beginning a new exercise program.  Walking programs offer a good workout. With walking there is a lower chance of overuse injuries common to runners. There is less impact and less jarring of the joints.  Begin all new exercise programs slowly. If problems or pain develop, decrease the amount of time or distance until you are at a comfortable level.  Wear good shoes and replace them regularly.  Stretch your foot and the heel cords at the back of the ankle (Achilles tendon) both before and after exercise.  Run or exercise on even surfaces that are not hard. For example, asphalt is better than pavement.  Do not run barefoot on hard surfaces.  If using a treadmill, vary the incline.  Do not continue to workout if you have foot or joint problems. Seek professional help if they do not improve. HOME CARE INSTRUCTIONS   Avoid activities that cause you pain until you recover.  Use ice or cold packs on the problem or painful areas after working out.  Only take over-the-counter or prescription medicines for pain, discomfort, or fever as directed by your caregiver.  Soft shoe inserts or athletic shoes with air  or gel sole cushions may be helpful.  If problems continue or become more severe, consult a sports medicine caregiver or your own health care provider. Cortisone is a potent anti-inflammatory medication that may be injected into the painful area. You can discuss this treatment with your caregiver. MAKE SURE YOU:   Understand these instructions.  Will watch your condition.  Will get help right away if you are not doing well or get worse. Document Released:  12/26/2000 Document Revised: 06/25/2011 Document Reviewed: 02/25/2008 [facility] Patient Information 2014 [place], [place].

## 2013-09-08 NOTE — Progress Notes (Signed)
INTERNAL MEDICINE TEACHING ATTENDING ADDENDUM - Aldine Contes, MD: I reviewed and discussed with the resident Dr. Burnard Bunting, the patient's medical history, physical examination, diagnosis and results of pertinent tests and treatment and I agree with the patient's care as documented.

## 2013-09-08 NOTE — Assessment & Plan Note (Signed)
BP Readings from Last 3 Encounters:  09/08/13 128/80  07/14/13 134/81  01/17/13 117/78    Lab Results  Component Value Date   NA 137 07/14/2013   K 4.0 07/14/2013   CREATININE 0.69 07/14/2013    Assessment: Blood pressure control: controlled Progress toward BP goal:  at goal Comments: bmet wnl 1 month ago  Plan: Medications:  continue current medications - hctz 25 Other plans: LDL goal <160, currently lifestyle modification, recheck at next visit

## 2013-09-21 ENCOUNTER — Emergency Department (HOSPITAL_COMMUNITY)
Admission: EM | Admit: 2013-09-21 | Discharge: 2013-09-21 | Disposition: A | Discharge status: Home / Self Care | Payer: BC Managed Care – PPO | Attending: Emergency Medicine | Admitting: Emergency Medicine

## 2013-09-21 ENCOUNTER — Emergency Department (HOSPITAL_COMMUNITY): Payer: BC Managed Care – PPO

## 2013-09-21 ENCOUNTER — Encounter (HOSPITAL_COMMUNITY): Payer: Self-pay | Admitting: Emergency Medicine

## 2013-09-21 DIAGNOSIS — Z91040 Latex allergy status: Secondary | ICD-10-CM | POA: Insufficient documentation

## 2013-09-21 DIAGNOSIS — Z8614 Personal history of Methicillin resistant Staphylococcus aureus infection: Secondary | ICD-10-CM | POA: Insufficient documentation

## 2013-09-21 DIAGNOSIS — F17200 Nicotine dependence, unspecified, uncomplicated: Secondary | ICD-10-CM | POA: Insufficient documentation

## 2013-09-21 DIAGNOSIS — Z79899 Other long term (current) drug therapy: Secondary | ICD-10-CM | POA: Insufficient documentation

## 2013-09-21 DIAGNOSIS — Z880 Allergy status to penicillin: Secondary | ICD-10-CM | POA: Insufficient documentation

## 2013-09-21 DIAGNOSIS — Z87828 Personal history of other (healed) physical injury and trauma: Secondary | ICD-10-CM | POA: Insufficient documentation

## 2013-09-21 DIAGNOSIS — Y929 Unspecified place or not applicable: Secondary | ICD-10-CM | POA: Insufficient documentation

## 2013-09-21 DIAGNOSIS — Y9301 Activity, walking, marching and hiking: Secondary | ICD-10-CM | POA: Insufficient documentation

## 2013-09-21 DIAGNOSIS — IMO0002 Reserved for concepts with insufficient information to code with codable children: Secondary | ICD-10-CM | POA: Insufficient documentation

## 2013-09-21 DIAGNOSIS — S92919A Unspecified fracture of unspecified toe(s), initial encounter for closed fracture: Secondary | ICD-10-CM | POA: Insufficient documentation

## 2013-09-21 DIAGNOSIS — S92502A Displaced unspecified fracture of left lesser toe(s), initial encounter for closed fracture: Secondary | ICD-10-CM

## 2013-09-21 DIAGNOSIS — K219 Gastro-esophageal reflux disease without esophagitis: Secondary | ICD-10-CM | POA: Insufficient documentation

## 2013-09-21 DIAGNOSIS — I10 Essential (primary) hypertension: Secondary | ICD-10-CM | POA: Insufficient documentation

## 2013-09-21 MED ORDER — HYDROCODONE-ACETAMINOPHEN 5-325 MG PO TABS
1.0000 | ORAL_TABLET | Freq: Once | ORAL | Status: AC
  Administered 2013-09-21: 1 via ORAL
  Filled 2013-09-21: qty 1

## 2013-09-21 MED ORDER — HYDROCODONE-ACETAMINOPHEN 5-325 MG PO TABS: 1.0000 | ORAL_TABLET | Freq: Four times a day (QID) | ORAL | Status: DC | PRN
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
## 2013-09-21 NOTE — Discharge Instructions (Signed)
Toe Fracture  Your caregiver has diagnosed you as having a fractured toe. A toe fracture is a break in the bone of a toe. "Buddy taping" is a way of splinting your broken toe, by taping the broken toe to the toe next to it. This "buddy taping" will keep the injured toe from moving beyond normal range of motion. Buddy taping also helps the toe heal in a more normal alignment. It may take 6 to 8 weeks for the toe injury to heal.  HOME CARE INSTRUCTIONS    Leave your toes taped together for as long as directed by your caregiver or until you see a doctor for a follow-up examination. You can change the tape after bathing. Always use a small piece of gauze or cotton between the toes when taping them together. This will help the skin stay dry and prevent infection.   Apply ice to the injury for 15-20 minutes each hour while awake for the first 2 days. Put the ice in a plastic bag and place a towel between the bag of ice and your skin.   After the first 2 days, apply heat to the injured area. Use heat for the next 2 to 3 days. Place a heating pad on the foot or soak the foot in warm water as directed by your caregiver.   Keep your foot elevated as much as possible to lessen swelling.   Wear sturdy, supportive shoes. The shoes should not pinch the toes or fit tightly against the toes.   Your caregiver may prescribe a rigid shoe if your foot is very swollen.   Your may be given crutches if the pain is too great and it hurts too much to walk.   Only take over-the-counter or prescription medicines for pain, discomfort, or fever as directed by your caregiver.   If your caregiver has given you a follow-up appointment, it is very important to keep that appointment. Not keeping the appointment could result in a chronic or permanent injury, pain, and disability. If there is any problem keeping the appointment, you must call back to this facility for assistance.  SEEK MEDICAL CARE IF:    You have increased pain or swelling,  not relieved with medications.   The pain does not get better after 1 week.   Your injured toe is cold when the others are warm.  SEEK IMMEDIATE MEDICAL CARE IF:    The toe becomes cold, numb, or white.   The toe becomes hot (inflamed) and red.  Document Released: 03/30/2000 Document Revised: 06/25/2011 Document Reviewed: 11/17/2007  [employer] Patient Information 2014 [employer]

## 2013-09-21 NOTE — ED Notes (Signed)
Pt states that she hit her left pinkie toe on corner of pole and since it has been sore.  Pt also concerned about right foot pain with walking

## 2013-09-21 NOTE — ED Notes (Signed)
EDP at the bedside.  ?

## 2013-10-01 ENCOUNTER — Encounter: Payer: BC Managed Care – PPO | Admitting: Dietician

## 2013-10-01 ENCOUNTER — Encounter: Payer: Self-pay | Admitting: Internal Medicine

## 2013-10-14 ENCOUNTER — Ambulatory Visit (INDEPENDENT_AMBULATORY_CARE_PROVIDER_SITE_OTHER): Payer: BC Managed Care – PPO | Admitting: Internal Medicine

## 2013-10-14 ENCOUNTER — Encounter: Payer: Self-pay | Admitting: Internal Medicine

## 2013-10-14 VITALS — BP 120/80 | HR 85 | Temp 97.0°F | Ht 62.0 in | Wt 225.2 lb

## 2013-10-14 DIAGNOSIS — M545 Low back pain, unspecified: Secondary | ICD-10-CM

## 2013-10-14 DIAGNOSIS — R300 Dysuria: Secondary | ICD-10-CM

## 2013-10-14 MED ORDER — CYCLOBENZAPRINE HCL 5 MG PO TABS: 5.0000 mg | ORAL_TABLET | Freq: Three times a day (TID) | ORAL | Status: DC | PRN

## 2013-10-14 MED ORDER — IBUPROFEN 600 MG PO TABS: 600.0000 mg | ORAL_TABLET | Freq: Three times a day (TID) | ORAL | Status: DC | PRN

## 2013-10-14 MED ORDER — HYDROCODONE-ACETAMINOPHEN 5-325 MG PO TABS: 1.0000 | ORAL_TABLET | Freq: Three times a day (TID) | ORAL | Status: DC | PRN

## 2013-10-14 NOTE — Patient Instructions (Addendum)
General Instructions: Take Flexeril at night as needed for back pain 5 mg(try at night) Take Ibuprofen 600 mg and Norco every 8 hours as needed  These may make you sleepy careful with driving  Return to clinic in 2 weeks   Treatment Goals:  Goals (1 Years of Data) as of 10/14/13         As of Today 09/21/13 09/21/13 09/08/13 07/14/13     Blood Pressure    . Blood Pressure < 140/90  120/80 132/91 127/100 128/80 134/81     Result Component    . LDL CALC < 160            Progress Toward Treatment Goals:  Treatment Goal 10/14/2013  Blood pressure at goal    Self Care Goals & Plans:  Self Care Goal 10/14/2013  Manage my medications take my medicines as prescribed; bring my medications to every visit; refill my medications on time; follow the sick day instructions if I am sick  Monitor my health keep track of my blood pressure  Eat healthy foods drink diet soda or water instead of juice or soda; eat more vegetables; eat foods that are low in salt; eat baked foods instead of fried foods; eat fruit for snacks and desserts  Be physically active find an activity I enjoy  Meeting treatment goals maintain the current self-care plan    No flowsheet data found.   Care Management & Community Referrals:  Referral 10/14/2013  Referrals made for care management support -  Referrals made to community resources none       Back Pain, Adult Low back pain is very common. About 1 in 5 people have back pain.The cause of low back pain is rarely dangerous. The pain often gets better over time.About half of people with a sudden onset of back pain feel better in just 2 weeks. About 8 in 10 people feel better by 6 weeks.  CAUSES Some common causes of back pain include:  Strain of the muscles or ligaments supporting the spine.  Wear and tear (degeneration) of the spinal discs.  Arthritis.  Direct injury to the back. DIAGNOSIS Most of the time, the direct cause of low back pain is not  known.However, back pain can be treated effectively even when the exact cause of the pain is unknown.Answering your caregiver's questions about your overall health and symptoms is one of the most accurate ways to make sure the cause of your pain is not dangerous. If your caregiver needs more information, he or she may order lab work or imaging tests (X-rays or MRIs).However, even if imaging tests show changes in your back, this usually does not require surgery. HOME CARE INSTRUCTIONS For many people, back pain returns.Since low back pain is rarely dangerous, it is often a condition that people can learn to [facility] their own.   Remain active. It is stressful on the back to sit or stand in one place. Do not sit, drive, or stand in one place for more than 30 minutes at a time. Take short walks on level surfaces as soon as pain allows.Try to increase the length of time you walk each day.  Do not stay in bed.Resting more than 1 or 2 days can delay your recovery.  Do not avoid exercise or work.Your body is made to move.It is not dangerous to be active, even though your back may hurt.Your back will likely heal faster if you return to being active before your pain is gone.  Pay attention to your body when you bend and lift. Many people have less discomfortwhen lifting if they bend their knees, keep the load close to their bodies,and avoid twisting. Often, the most comfortable positions are those that put less stress on your recovering back.  Find a comfortable position to sleep. Use a firm mattress and lie on your side with your knees slightly bent. If you lie on your back, put a pillow under your knees.  Only take over-the-counter or prescription medicines as directed by your caregiver. Over-the-counter medicines to reduce pain and inflammation are often the most helpful.Your caregiver may prescribe muscle relaxant drugs.These medicines help dull your pain so you can more quickly return to  your normal activities and healthy exercise.  Put ice on the injured area.  Put ice in a plastic bag.  Place a towel between your skin and the bag.  Leave the ice on for 15-20 minutes, 03-04 times a day for the first 2 to 3 days. After that, ice and heat may be alternated to reduce pain and spasms.  Ask your caregiver about trying back exercises and gentle massage. This may be of some benefit.  Avoid feeling anxious or stressed.Stress increases muscle tension and can worsen back pain.It is important to recognize when you are anxious or stressed and learn ways to manage it.Exercise is a great option. SEEK MEDICAL CARE IF:  You have pain that is not relieved with rest or medicine.  You have pain that does not improve in 1 week.  You have new symptoms.  You are generally not feeling well. SEEK IMMEDIATE MEDICAL CARE IF:   You have pain that radiates from your back into your legs.  You develop new bowel or bladder control problems.  You have unusual weakness or numbness in your arms or legs.  You develop nausea or vomiting.  You develop abdominal pain.  You feel faint. Document Released: 04/02/2005 Document Revised: 10/02/2011 Document Reviewed: 08/21/2010 [facility] Patient Information 2015 [place], [place]. This information is not intended to replace advice given to you by your health care provider. Make sure you discuss any questions you have with your health care provider.   Dysuria Dysuria is the medical term for pain with urination. There are many causes for dysuria, but urinary tract infection is the most common. If a urinalysis was performed it can show that there is a urinary tract infection. A urine culture confirms that you or your child is sick. You will need to follow up with a healthcare provider because:  If a urine culture was done you will need to know the culture results and treatment recommendations.  If the urine culture was positive, you or your child will  need to be put on antibiotics or know if the antibiotics prescribed are the right antibiotics for your urinary tract infection.  If the urine culture is negative (no urinary tract infection), then other causes may need to be explored or antibiotics need to be stopped. Today laboratory work may have been done and there does not seem to be an infection. If cultures were done they will take at least 24 to 48 hours to be completed. Today x-rays may have been taken and they read as normal. No cause can be found for the problems. The x-rays may be re-read by a radiologist and you will be contacted if additional findings are made. You or your child may have been put on medications to help with this problem until you can see your  primary caregiver. If the problems get better, see your primary caregiver if the problems return. If you were given antibiotics (medications which kill germs), take all of the mediations as directed for the full course of treatment.  If laboratory work was done, you need to find the results. Leave a telephone number where you can be reached. If this is not possible, make sure you find out how you are to get test results. HOME CARE INSTRUCTIONS   Drink lots of fluids. For adults, drink eight, 8 ounce glasses of clear juice or water a day. For children, replace fluids as suggested by your caregiver.  Empty the bladder often. Avoid holding urine for long periods of time.  After a bowel movement, women should cleanse front to back, using each tissue only once.  Empty your bladder before and after sexual intercourse.  Take all the medicine given to you until it is gone. You may feel better in a few days, but TAKE ALL MEDICINE.  Avoid caffeine, tea, alcohol and carbonated beverages, because they tend to irritate the bladder.  In men, alcohol may irritate the prostate.  Only take over-the-counter or prescription medicines for pain, discomfort, or fever as directed by your  caregiver.  If your caregiver has given you a follow-up appointment, it is very important to keep that appointment. Not keeping the appointment could result in a chronic or permanent injury, pain, and disability. If there is any problem keeping the appointment, you must call back to this facility for assistance. SEEK IMMEDIATE MEDICAL CARE IF:   Back pain develops.  A fever develops.  There is nausea (feeling sick to your stomach) or vomiting (throwing up).  Problems are no better with medications or are getting worse. MAKE SURE YOU:   Understand these instructions.  Will watch your condition.  Will get help right away if you are not doing well or get worse. Document Released: 12/30/2003 Document Revised: 06/25/2011 Document Reviewed: 11/06/2007 [facility] Patient Information 2015 [place], [place]. This information is not intended to replace advice given to you by your health care provider. Make sure you discuss any questions you have with your health care provider.  Ibuprofen tablets and capsules What is this medicine? IBUPROFEN (eye BYOO proe fen) is a non-steroidal anti-inflammatory drug (NSAID). It is used for dental pain, fever, headaches or migraines, osteoarthritis, rheumatoid arthritis, or painful monthly periods. It can also relieve minor aches and pains caused by a cold, flu, or sore throat. This medicine may be used for other purposes; ask your health care provider or pharmacist if you have questions. COMMON BRAND NAME(S): Advil, Advil Junior Strength, Advil Migraine, Genpril, Ibren, IBU, Midol, Midol Cramps and Body Aches, Motrin, Motrin IB, Motrin Junior Strength, Motrin Migraine Pain, Samson-8 What should I tell my health care provider before I take this medicine? They need to know if you have any of these conditions: -asthma -cigarette smoker -drink more than 3 alcohol containing drinks a day -heart disease or circulation problems such as heart failure or leg edema (fluid  retention) -high blood pressure -kidney disease -liver disease -stomach bleeding or ulcers -an unusual or allergic reaction to ibuprofen, aspirin, other NSAIDS, other medicines, foods, dyes, or preservatives -pregnant or trying to get pregnant -breast-feeding How should I use this medicine? Take this medicine by mouth with a glass of water. Follow the directions on the prescription label. Take this medicine with food if your stomach gets upset. Try to not lie down for at least 10 minutes after you  take the medicine. Take your medicine at regular intervals. Do not take your medicine more often than directed. A special MedGuide will be given to you by the pharmacist with each prescription and refill. Be sure to read this information carefully each time. Talk to your pediatrician regarding the use of this medicine in children. Special care may be needed. Overdosage: If you think you have taken too much of this medicine contact a poison control center or emergency room at once. NOTE: This medicine is only for you. Do not share this medicine with others. What if I miss a dose? If you miss a dose, take it as soon as you can. If it is almost time for your next dose, take only that dose. Do not take double or extra doses. What may interact with this medicine? Do not take this medicine with any of the following medications: -cidofovir -ketorolac -methotrexate -pemetrexed This medicine may also interact with the following medications: -alcohol -aspirin -diuretics -lithium -other drugs for inflammation like prednisone -warfarin This list may not describe all possible interactions. Give your health care provider a list of all the medicines, herbs, non-prescription drugs, or dietary supplements you use. Also tell them if you smoke, drink alcohol, or use illegal drugs. Some items may interact with your medicine. What should I watch for while using this medicine? Tell your doctor or healthcare  professional if your symptoms do not start to get better or if they get worse. This medicine does not prevent heart attack or stroke. In fact, this medicine may increase the chance of a heart attack or stroke. The chance may increase with longer use of this medicine and in people who have heart disease. If you take aspirin to prevent heart attack or stroke, talk with your doctor or health care professional. Do not take other medicines that contain aspirin, ibuprofen, or naproxen with this medicine. Side effects such as stomach upset, nausea, or ulcers may be more likely to occur. Many medicines available without a prescription should not be taken with this medicine. This medicine can cause ulcers and bleeding in the stomach and intestines at any time during treatment. Ulcers and bleeding can happen without warning symptoms and can cause death. To reduce your risk, do not smoke cigarettes or drink alcohol while you are taking this medicine. You may get drowsy or dizzy. Do not drive, use machinery, or do anything that needs mental alertness until you know how this medicine affects you. Do not stand or sit up quickly, especially if you are an older patient. This reduces the risk of dizzy or fainting spells. This medicine can cause you to bleed more easily. Try to avoid damage to your teeth and gums when you brush or floss your teeth. This medicine may be used to treat migraines. If you take migraine medicines for 10 or more days a month, your migraines may get worse. Keep a diary of headache days and medicine use. Contact your healthcare professional if your migraine attacks occur more frequently. What side effects may I notice from receiving this medicine? Side effects that you should report to your doctor or health care professional as soon as possible: -allergic reactions like skin rash, itching or hives, swelling of the face, lips, or tongue -severe stomach pain -signs and symptoms of bleeding such as  bloody or black, tarry stools; red or dark-brown urine; spitting up blood or brown material that looks like coffee grounds; red spots on the skin; unusual bruising or bleeding from the  eye, gums, or nose -signs and symptoms of a blood clot such as changes in vision; chest pain; severe, sudden headache; trouble speaking; sudden numbness or weakness of the face, arm, or leg -unexplained weight gain or swelling -unusually weak or tired -yellowing of eyes or skin Side effects that usually do not require medical attention (report to your doctor or health care professional if they continue or are bothersome): -bruising -diarrhea -dizziness, drowsiness -headache -nausea, vomiting This list may not describe all possible side effects. Call your doctor for medical advice about side effects. You may report side effects to FDA at 1-800-FDA-1088. Where should I keep my medicine? Keep out of the reach of children. Store at room temperature between 15 and 30 degrees C (59 and 86 degrees F). Keep container tightly closed. Throw away any unused medicine after the expiration date. NOTE: This sheet is a summary. It may not cover all possible information. If you have questions about this medicine, talk to your doctor, pharmacist, or health care provider.  2015, Elsevier/Gold Standard. (2012-12-02 10:48:02)  Acetaminophen; Hydrocodone tablets or capsules What is this medicine? ACETAMINOPHEN; HYDROCODONE (a set a MEE noe fen; hye droe KOE done) is a pain reliever. It is used to treat mild to moderate pain. This medicine may be used for other purposes; ask your health care provider or pharmacist if you have questions. COMMON BRAND NAME(S): Anexsia, Bancap HC, Ceta-Plus, Co-Gesic, Comfortpak, Dolagesic, [employer], DuoCet, Hydrocet, Hydrogesic, Grant, Lorcet HD, Lorcet Plus, Lortab, Margesic H, Maxidone, [place], Polygesic, [place], [place], [place], Vicodin, Vicodin ES, Vicodin HP, Charlane Ferretti What should I  tell my health care provider before I take this medicine? They need to know if you have any of these conditions: -brain tumor -Crohn's disease, inflammatory bowel disease, or ulcerative colitis -drug abuse or addiction -head injury -heart or circulation problems -if you often drink alcohol -kidney disease or problems going to the bathroom -liver disease -lung disease, asthma, or breathing problems -an unusual or allergic reaction to acetaminophen, hydrocodone, other opioid analgesics, other medicines, foods, dyes, or preservatives -pregnant or trying to get pregnant -breast-feeding How should I use this medicine? Take this medicine by mouth. Swallow it with a full glass of water. Follow the directions on the prescription label. If the medicine upsets your stomach, take the medicine with food or milk. Do not take more than you are told to take. Talk to your pediatrician regarding the use of this medicine in children. This medicine is not approved for use in children. Overdosage: If you think you have taken too much of this medicine contact a poison control center or emergency room at once. NOTE: This medicine is only for you. Do not share this medicine with others. What if I miss a dose? If you miss a dose, take it as soon as you can. If it is almost time for your next dose, take only that dose. Do not take double or extra doses. What may interact with this medicine? -alcohol -antihistamines -isoniazid -medicines for depression, anxiety, or psychotic disturbances -medicines for sleep -muscle relaxants -naltrexone -narcotic medicines (opiates) for pain -phenobarbital -ritonavir -tramadol This list may not describe all possible interactions. Give your health care provider a list of all the medicines, herbs, non-prescription drugs, or dietary supplements you use. Also tell them if you smoke, drink alcohol, or use illegal drugs. Some items may interact with your medicine. What should I  watch for while using this medicine? Tell your doctor or health care professional if your pain  does not go away, if it gets worse, or if you have new or a different type of pain. You may develop tolerance to the medicine. Tolerance means that you will need a higher dose of the medicine for pain relief. Tolerance is normal and is expected if you take the medicine for a long time. Do not suddenly stop taking your medicine because you may develop a severe reaction. Your body becomes used to the medicine. This does NOT mean you are addicted. Addiction is a behavior related to getting and using a drug for a non-medical reason. If you have pain, you have a medical reason to take pain medicine. Your doctor will tell you how much medicine to take. If your doctor wants you to stop the medicine, the dose will be slowly lowered over time to avoid any side effects. You may get drowsy or dizzy when you first start taking the medicine or change doses. Do not drive, use machinery, or do anything that may be dangerous until you know how the medicine affects you. Stand or sit up slowly. There are different types of narcotic medicines (opiates) for pain. If you take more than one type at the same time, you may have more side effects. Give your health care provider a list of all medicines you use. Your doctor will tell you how much medicine to take. Do not take more medicine than directed. Call emergency for help if you have problems breathing. The medicine will cause constipation. Try to have a bowel movement at least every 2 to 3 days. If you do not have a bowel movement for 3 days, call your doctor or health care professional. Too much acetaminophen can be very dangerous. Do not take Tylenol (acetaminophen) or medicines that contain acetaminophen with this medicine. Many non-prescription medicines contain acetaminophen. Always read the labels carefully. What side effects may I notice from receiving this medicine? Side  effects that you should report to your doctor or health care professional as soon as possible: -allergic reactions like skin rash, itching or hives, swelling of the face, lips, or tongue -breathing problems -confusion -feeling faint or lightheaded, falls -stomach pain -yellowing of the eyes or skin Side effects that usually do not require medical attention (report to your doctor or health care professional if they continue or are bothersome): -nausea, vomiting -stomach upset This list may not describe all possible side effects. Call your doctor for medical advice about side effects. You may report side effects to FDA at 1-800-FDA-1088. Where should I keep my medicine? Keep out of the reach of children. This medicine can be abused. Keep your medicine in a safe place to protect it from theft. Do not share this medicine with anyone. Selling or giving away this medicine is dangerous and against the law. Store at room temperature between 15 and 30 degrees C (59 and 86 degrees F). Protect from light. Keep container tightly closed. Throw away any unused medicine after the expiration date. Discard unused medicine and used packaging carefully. Pets and children can be harmed if they find used or lost packages. NOTE: This sheet is a summary. It may not cover all possible information. If you have questions about this medicine, talk to your doctor, pharmacist, or health care provider.  2015, Elsevier/Gold Standard. (2012-11-24 13:15:56)  Cyclobenzaprine tablets What is this medicine? CYCLOBENZAPRINE (sye kloe BEN za preen) is a muscle relaxer. It is used to treat muscle pain, spasms, and stiffness. This medicine may be used for other purposes; ask  your health care provider or pharmacist if you have questions. COMMON BRAND NAME(S): Fexmid, Flexeril What should I tell my health care provider before I take this medicine? They need to know if you have any of these conditions: -heart disease, irregular  heartbeat, or previous heart attack -liver disease -thyroid problem -an unusual or allergic reaction to cyclobenzaprine, tricyclic antidepressants, lactose, other medicines, foods, dyes, or preservatives -pregnant or trying to get pregnant -breast-feeding How should I use this medicine? Take this medicine by mouth with a glass of water. Follow the directions on the prescription label. If this medicine upsets your stomach, take it with food or milk. Take your medicine at regular intervals. Do not take it more often than directed. Talk to your pediatrician regarding the use of this medicine in children. Special care may be needed. Overdosage: If you think you have taken too much of this medicine contact a poison control center or emergency room at once. NOTE: This medicine is only for you. Do not share this medicine with others. What if I miss a dose? If you miss a dose, take it as soon as you can. If it is almost time for your next dose, take only that dose. Do not take double or extra doses. What may interact with this medicine? Do not take this medicine with any of the following medications: -certain medicines for fungal infections like fluconazole, itraconazole, ketoconazole, posaconazole, voriconazole -cisapride -dofetilide -dronedarone -droperidol -flecainide -grepafloxacin -halofantrine -levomethadyl -MAOIs like Carbex, Eldepryl, Marplan, Nardil, and Parnate -nilotinib -pimozide -probucol -sertindole -thioridazine -ziprasidone This medicine may also interact with the following medications: -abarelix -alcohol -certain medicines for cancer -certain medicines for depression, anxiety, or psychotic disturbances -certain medicines for infection like alfuzosin, chloroquine, clarithromycin, levofloxacin, mefloquine, pentamidine, troleandomycin -certain medicines for an irregular heart beat -certain medicines used for sleep or numbness during surgery or procedure -contrast  dyes -dolasetron -guanethidine -methadone -octreotide -ondansetron -other medicines that prolong the QT interval (cause an abnormal heart rhythm) -palonosetron -phenothiazines like chlorpromazine, mesoridazine, prochlorperazine, thioridazine -tramadol -vardenafil This list may not describe all possible interactions. Give your health care provider a list of all the medicines, herbs, non-prescription drugs, or dietary supplements you use. Also tell them if you smoke, drink alcohol, or use illegal drugs. Some items may interact with your medicine. What should I watch for while using this medicine? Check with your doctor or health care professional if your condition does not improve within 1 to 3 weeks. You may get drowsy or dizzy when you first start taking the medicine or change doses. Do not drive, use machinery, or do anything that may be dangerous until you know how the medicine affects you. Stand or sit up slowly. Your mouth may get dry. Drinking water, chewing sugarless gum, or sucking on hard candy may help. What side effects may I notice from receiving this medicine? Side effects that you should report to your doctor or health care professional as soon as possible: -allergic reactions like skin rash, itching or hives, swelling of the face, lips, or tongue -chest pain -fast heartbeat -hallucinations -seizures -vomiting Side effects that usually do not require medical attention (report to your doctor or health care professional if they continue or are bothersome): -headache This list may not describe all possible side effects. Call your doctor for medical advice about side effects. You may report side effects to FDA at 1-800-FDA-1088. Where should I keep my medicine? Keep out of the reach of children. Store at room temperature between 15 and 30  degrees C (59 and 86 degrees F). Keep container tightly closed. Throw away any unused medicine after the expiration date. NOTE: This sheet is  a summary. It may not cover all possible information. If you have questions about this medicine, talk to your doctor, pharmacist, or health care provider.  2015, Elsevier/Gold Standard. (2012-10-28 12:48:19)

## 2013-10-15 ENCOUNTER — Encounter: Payer: Self-pay | Admitting: Internal Medicine

## 2013-10-15 DIAGNOSIS — R300 Dysuria: Secondary | ICD-10-CM | POA: Insufficient documentation

## 2013-10-15 LAB — URINALYSIS, ROUTINE W REFLEX MICROSCOPIC
BILIRUBIN URINE: NEGATIVE
GLUCOSE, UA: NEGATIVE mg/dL
Hgb urine dipstick: NEGATIVE
Ketones, ur: NEGATIVE mg/dL
Nitrite: NEGATIVE
PROTEIN: NEGATIVE mg/dL
Specific Gravity, Urine: 1.02 (ref 1.005–1.030)
Urobilinogen, UA: 1 mg/dL (ref 0.0–1.0)
pH: 5.5 (ref 5.0–8.0)

## 2013-10-15 LAB — URINALYSIS, MICROSCOPIC ONLY
Bacteria, UA: NONE SEEN
CRYSTALS: NONE SEEN
Casts: NONE SEEN

## 2013-10-15 NOTE — Assessment & Plan Note (Signed)
Etiology could be due findings on previous MRI 2005 right paracentral disc protrusion, mild deg changes L4-L5, L5-S1, MSK strain/spasm Will tx with Flexeril 5 mg tid prn (advised pt to take at night b/c could make sedated), Norco 5-325 mg tid prn, alternate with Advil 600 mg tid prn, can try heat as well  F/u in 1-2 weeks  Will likely need to do repeat lumbar MRI to see if worsening pathology  Advised not to ride rides at [employer]

## 2013-10-15 NOTE — Progress Notes (Signed)
INTERNAL MEDICINE TEACHING ATTENDING ADDENDUM - Celisse Ciulla, MD: I reviewed and discussed at the time of visit with the resident Dr. McLean, the patient's medical history, physical examination, diagnosis and results of tests and treatment and I agree with the patient's care as documented. 

## 2013-10-15 NOTE — Assessment & Plan Note (Signed)
Experienced last this week. Assoc with increased freq.  Will check UA if + will send Culture and tx
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
## 2013-10-16 ENCOUNTER — Encounter (HOSPITAL_COMMUNITY): Payer: Self-pay | Admitting: Emergency Medicine

## 2013-10-16 ENCOUNTER — Emergency Department (HOSPITAL_COMMUNITY)
Admission: EM | Admit: 2013-10-16 | Discharge: 2013-10-16 | Disposition: A | Discharge status: Home / Self Care | Payer: BC Managed Care – PPO | Attending: Emergency Medicine | Admitting: Emergency Medicine

## 2013-10-16 DIAGNOSIS — I10 Essential (primary) hypertension: Secondary | ICD-10-CM | POA: Insufficient documentation

## 2013-10-16 DIAGNOSIS — Z91040 Latex allergy status: Secondary | ICD-10-CM | POA: Insufficient documentation

## 2013-10-16 DIAGNOSIS — Z79899 Other long term (current) drug therapy: Secondary | ICD-10-CM | POA: Insufficient documentation

## 2013-10-16 DIAGNOSIS — Z880 Allergy status to penicillin: Secondary | ICD-10-CM | POA: Insufficient documentation

## 2013-10-16 DIAGNOSIS — S39012A Strain of muscle, fascia and tendon of lower back, initial encounter: Secondary | ICD-10-CM

## 2013-10-16 DIAGNOSIS — J309 Allergic rhinitis, unspecified: Secondary | ICD-10-CM | POA: Insufficient documentation

## 2013-10-16 DIAGNOSIS — Z91018 Allergy to other foods: Secondary | ICD-10-CM | POA: Insufficient documentation

## 2013-10-16 DIAGNOSIS — Z8614 Personal history of Methicillin resistant Staphylococcus aureus infection: Secondary | ICD-10-CM | POA: Insufficient documentation

## 2013-10-16 DIAGNOSIS — M62838 Other muscle spasm: Secondary | ICD-10-CM | POA: Insufficient documentation

## 2013-10-16 DIAGNOSIS — K219 Gastro-esophageal reflux disease without esophagitis: Secondary | ICD-10-CM | POA: Insufficient documentation

## 2013-10-16 DIAGNOSIS — F17200 Nicotine dependence, unspecified, uncomplicated: Secondary | ICD-10-CM | POA: Insufficient documentation

## 2013-10-16 MED ORDER — TRAMADOL HCL 50 MG PO TABS
50.0000 mg | ORAL_TABLET | Freq: Once | ORAL | Status: AC
  Administered 2013-10-16: 50 mg via ORAL
  Filled 2013-10-16: qty 1

## 2013-10-16 MED ORDER — DIAZEPAM 5 MG PO TABS
5.0000 mg | ORAL_TABLET | Freq: Once | ORAL | Status: AC
  Administered 2013-10-16: 5 mg via ORAL
  Filled 2013-10-16: qty 1

## 2013-10-16 MED ORDER — NAPROXEN 375 MG PO TABS: 375.0000 mg | ORAL_TABLET | Freq: Two times a day (BID) | ORAL | Status: DC

## 2013-10-16 MED ORDER — TRAMADOL HCL 50 MG PO TABS: 50.0000 mg | ORAL_TABLET | Freq: Four times a day (QID) | ORAL | Status: DC | PRN

## 2013-10-16 MED ORDER — CYCLOBENZAPRINE HCL 10 MG PO TABS: 10.0000 mg | ORAL_TABLET | Freq: Two times a day (BID) | ORAL | Status: DC | PRN

## 2013-10-16 NOTE — ED Notes (Signed)
Pt. reports left lower back pain onset Wednesday morning unrelieved by prescription pain medications , denies injury or fall , no dysuria or hematuria.

## 2013-10-16 NOTE — Discharge Instructions (Signed)
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
## 2013-10-28 ENCOUNTER — Ambulatory Visit: Payer: BC Managed Care – PPO | Admitting: Internal Medicine

## 2013-11-04 ENCOUNTER — Encounter: Payer: BC Managed Care – PPO | Admitting: Internal Medicine

## 2013-11-11 ENCOUNTER — Encounter (HOSPITAL_COMMUNITY): Payer: Self-pay | Admitting: Emergency Medicine

## 2013-11-11 ENCOUNTER — Emergency Department (HOSPITAL_COMMUNITY)
Admission: EM | Admit: 2013-11-11 | Discharge: 2013-11-11 | Disposition: A | Discharge status: Home / Self Care | Payer: BC Managed Care – PPO | Attending: Emergency Medicine | Admitting: Emergency Medicine

## 2013-11-11 DIAGNOSIS — E663 Overweight: Secondary | ICD-10-CM | POA: Insufficient documentation

## 2013-11-11 DIAGNOSIS — F17200 Nicotine dependence, unspecified, uncomplicated: Secondary | ICD-10-CM | POA: Insufficient documentation

## 2013-11-11 DIAGNOSIS — K219 Gastro-esophageal reflux disease without esophagitis: Secondary | ICD-10-CM | POA: Insufficient documentation

## 2013-11-11 DIAGNOSIS — Z791 Long term (current) use of non-steroidal anti-inflammatories (NSAID): Secondary | ICD-10-CM | POA: Insufficient documentation

## 2013-11-11 DIAGNOSIS — Z880 Allergy status to penicillin: Secondary | ICD-10-CM | POA: Insufficient documentation

## 2013-11-11 DIAGNOSIS — I10 Essential (primary) hypertension: Secondary | ICD-10-CM | POA: Insufficient documentation

## 2013-11-11 DIAGNOSIS — Z7982 Long term (current) use of aspirin: Secondary | ICD-10-CM | POA: Insufficient documentation

## 2013-11-11 DIAGNOSIS — Z8614 Personal history of Methicillin resistant Staphylococcus aureus infection: Secondary | ICD-10-CM | POA: Insufficient documentation

## 2013-11-11 DIAGNOSIS — Z91040 Latex allergy status: Secondary | ICD-10-CM | POA: Insufficient documentation

## 2013-11-11 DIAGNOSIS — Z79899 Other long term (current) drug therapy: Secondary | ICD-10-CM | POA: Insufficient documentation

## 2013-11-11 NOTE — ED Notes (Signed)
Patient presents stating that her BP is up
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
## 2013-11-11 NOTE — Discharge Instructions (Signed)

## 2013-11-15 ENCOUNTER — Emergency Department (HOSPITAL_COMMUNITY): Payer: BC Managed Care – PPO

## 2013-11-15 ENCOUNTER — Emergency Department (HOSPITAL_COMMUNITY)
Admission: EM | Admit: 2013-11-15 | Discharge: 2013-11-15 | Discharge status: Against Medical Advice | Payer: No Typology Code available for payment source

## 2013-11-15 ENCOUNTER — Encounter (HOSPITAL_COMMUNITY): Payer: Self-pay | Admitting: Emergency Medicine

## 2013-11-15 ENCOUNTER — Emergency Department (HOSPITAL_COMMUNITY)
Admission: EM | Admit: 2013-11-15 | Discharge: 2013-11-16 | Disposition: A | Discharge status: Home / Self Care | Payer: BC Managed Care – PPO | Attending: Emergency Medicine | Admitting: Emergency Medicine

## 2013-11-15 DIAGNOSIS — Z7982 Long term (current) use of aspirin: Secondary | ICD-10-CM | POA: Insufficient documentation

## 2013-11-15 DIAGNOSIS — Z8614 Personal history of Methicillin resistant Staphylococcus aureus infection: Secondary | ICD-10-CM | POA: Insufficient documentation

## 2013-11-15 DIAGNOSIS — Y9389 Activity, other specified: Secondary | ICD-10-CM | POA: Insufficient documentation

## 2013-11-15 DIAGNOSIS — I10 Essential (primary) hypertension: Secondary | ICD-10-CM | POA: Insufficient documentation

## 2013-11-15 DIAGNOSIS — S3981XA Other specified injuries of abdomen, initial encounter: Secondary | ICD-10-CM | POA: Insufficient documentation

## 2013-11-15 DIAGNOSIS — S46909A Unspecified injury of unspecified muscle, fascia and tendon at shoulder and upper arm level, unspecified arm, initial encounter: Secondary | ICD-10-CM | POA: Insufficient documentation

## 2013-11-15 DIAGNOSIS — N390 Urinary tract infection, site not specified: Secondary | ICD-10-CM

## 2013-11-15 DIAGNOSIS — Y92410 Unspecified street and highway as the place of occurrence of the external cause: Secondary | ICD-10-CM | POA: Insufficient documentation

## 2013-11-15 DIAGNOSIS — S4980XA Other specified injuries of shoulder and upper arm, unspecified arm, initial encounter: Secondary | ICD-10-CM | POA: Insufficient documentation

## 2013-11-15 DIAGNOSIS — S199XXA Unspecified injury of neck, initial encounter: Principal | ICD-10-CM

## 2013-11-15 DIAGNOSIS — S0993XA Unspecified injury of face, initial encounter: Secondary | ICD-10-CM | POA: Insufficient documentation

## 2013-11-15 DIAGNOSIS — K219 Gastro-esophageal reflux disease without esophagitis: Secondary | ICD-10-CM | POA: Insufficient documentation

## 2013-11-15 DIAGNOSIS — M542 Cervicalgia: Secondary | ICD-10-CM

## 2013-11-15 DIAGNOSIS — Z91040 Latex allergy status: Secondary | ICD-10-CM | POA: Insufficient documentation

## 2013-11-15 DIAGNOSIS — IMO0002 Reserved for concepts with insufficient information to code with codable children: Secondary | ICD-10-CM | POA: Insufficient documentation

## 2013-11-15 DIAGNOSIS — F17200 Nicotine dependence, unspecified, uncomplicated: Secondary | ICD-10-CM | POA: Insufficient documentation

## 2013-11-15 DIAGNOSIS — Z880 Allergy status to penicillin: Secondary | ICD-10-CM | POA: Insufficient documentation

## 2013-11-15 DIAGNOSIS — M791 Myalgia, unspecified site: Secondary | ICD-10-CM

## 2013-11-15 DIAGNOSIS — M25512 Pain in left shoulder: Secondary | ICD-10-CM

## 2013-11-15 DIAGNOSIS — Z9889 Other specified postprocedural states: Secondary | ICD-10-CM | POA: Insufficient documentation

## 2013-11-15 DIAGNOSIS — Z791 Long term (current) use of non-steroidal anti-inflammatories (NSAID): Secondary | ICD-10-CM | POA: Insufficient documentation

## 2013-11-15 LAB — URINALYSIS, ROUTINE W REFLEX MICROSCOPIC
BILIRUBIN URINE: NEGATIVE
Glucose, UA: NEGATIVE mg/dL
HGB URINE DIPSTICK: NEGATIVE
Ketones, ur: NEGATIVE mg/dL
NITRITE: NEGATIVE
PROTEIN: NEGATIVE mg/dL
Specific Gravity, Urine: 1.023 (ref 1.005–1.030)
UROBILINOGEN UA: 1 mg/dL (ref 0.0–1.0)
pH: 6.5 (ref 5.0–8.0)

## 2013-11-15 LAB — COMPREHENSIVE METABOLIC PANEL
ALT: 18 U/L (ref 0–35)
AST: 16 U/L (ref 0–37)
Albumin: 4.1 g/dL (ref 3.5–5.2)
Alkaline Phosphatase: 86 U/L (ref 39–117)
Anion gap: 13 (ref 5–15)
BILIRUBIN TOTAL: 0.4 mg/dL (ref 0.3–1.2)
BUN: 15 mg/dL (ref 6–23)
CO2: 24 meq/L (ref 19–32)
CREATININE: 0.67 mg/dL (ref 0.50–1.10)
Calcium: 9.6 mg/dL (ref 8.4–10.5)
Chloride: 102 mEq/L (ref 96–112)
GFR calc Af Amer: >90 mL/min (ref >90)
GLUCOSE: 117 mg/dL — AB (ref 70–99)
Potassium: 4 mEq/L (ref 3.7–5.3)
Sodium: 139 mEq/L (ref 137–147)
Total Protein: 8 g/dL (ref 6.0–8.3)

## 2013-11-15 LAB — CBC WITH DIFFERENTIAL/PLATELET
Basophils Absolute: 0 10*3/uL (ref 0.0–0.1)
Basophils Relative: 0 % (ref 0–1)
Eosinophils Absolute: 0.2 10*3/uL (ref 0.0–0.7)
Eosinophils Relative: 2 % (ref 0–5)
HCT: 40.3 % (ref 36.0–46.0)
HEMOGLOBIN: 13.1 g/dL (ref 12.0–15.0)
LYMPHS ABS: 3.3 10*3/uL (ref 0.7–4.0)
LYMPHS PCT: 31 % (ref 12–46)
MCH: 27.7 pg (ref 26.0–34.0)
MCHC: 32.5 g/dL (ref 30.0–36.0)
MCV: 85.2 fL (ref 78.0–100.0)
MONO ABS: 0.7 10*3/uL (ref 0.1–1.0)
MONOS PCT: 7 % (ref 3–12)
NEUTROS ABS: 6.3 10*3/uL (ref 1.7–7.7)
Neutrophils Relative %: 60 % (ref 43–77)
Platelets: 329 10*3/uL (ref 150–400)
RBC: 4.73 MIL/uL (ref 3.87–5.11)
RDW: 14.6 % (ref 11.5–15.5)
WBC: 10.7 10*3/uL — ABNORMAL HIGH (ref 4.0–10.5)

## 2013-11-15 LAB — URINE MICROSCOPIC-ADD ON

## 2013-11-15 MED ORDER — IOHEXOL 300 MG/ML  SOLN
100.0000 mL | Freq: Once | INTRAMUSCULAR | Status: AC | PRN
  Administered 2013-11-15: 100 mL via INTRAVENOUS
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
## 2013-11-15 NOTE — ED Notes (Signed)
Pt had collar applied in triage.  Pt able to move all extremities.

## 2013-11-16 MED ORDER — NITROFURANTOIN MONOHYD MACRO 100 MG PO CAPS: 100.0000 mg | ORAL_CAPSULE | Freq: Two times a day (BID) | ORAL | Status: DC

## 2013-11-16 NOTE — ED Provider Notes (Signed)
Medical screening examination/treatment/procedure(s) were performed by non-physician practitioner and as supervising physician I was immediately available for consultation/collaboration.   EKG Interpretation None        Ezequiel Essex, MD 11/16/13 [phone]

## 2013-11-16 NOTE — Discharge Instructions (Signed)
Please call your doctor for a followup appointment within 24-48 hours. When you talk to your doctor please let them know that you were seen in the emergency department and have them acquire all of your records so that they can discuss the findings with you and formulate a treatment plan to fully care for your new and ongoing problems. Please call and set-up an appointment with your primary care provider - please follow up with doctor and repeat chest xray to be performed regarding finding on the exam, will need to be monitored.  Please rest and stay hydrated Please continue to take at home pain medications as prescribed - please do not drink alcohol, drive, operate heavy machinery. Please do not take any extra Tylenol with Vicodin for this can lead to Tylenol and liver issues Please apply heat to aid in muscular relief Please massage with icy hot ointment  Please avoid any physical or strenuous activity  Please continue to monitor symptoms closely and if symptoms are to worsen or change (fever greater than 101, chills, chest pain, shortness of breath, difficulty breathing, numbness, tingling, worsening or changes to pain pattern, headache, dizziness, nausea, vomiting, diarrhea, stomach pain, loss of sensation, fall, injury) please report back to the ED immediately  Cervical Strain and Sprain (Whiplash) with Rehab Cervical strain and sprain are injuries that commonly occur with "whiplash" injuries. Whiplash occurs when the neck is forcefully whipped backward or forward, such as during a motor vehicle accident or during contact sports. The muscles, ligaments, tendons, discs, and nerves of the neck are susceptible to injury when this occurs. RISK FACTORS Risk of having a whiplash injury increases if:  Osteoarthritis of the spine.  Situations that make head or neck accidents or trauma more likely.  High-risk sports (football, rugby, wrestling, hockey, auto racing, gymnastics, diving, contact karate,  or boxing).  Poor strength and flexibility of the neck.  Previous neck injury.  Poor tackling technique.  Improperly fitted or padded equipment. SYMPTOMS   Pain or stiffness in the front or back of neck or both.  Symptoms may present immediately or up to 24 hours after injury.  Dizziness, headache, nausea, and vomiting.  Muscle spasm with soreness and stiffness in the neck.  Tenderness and swelling at the injury site. PREVENTION  Learn and use proper technique (avoid tackling with the head, spearing, and head-butting; use proper falling techniques to avoid landing on the head).  Warm up and stretch properly before activity.  Maintain physical fitness:  Strength, flexibility, and endurance.  Cardiovascular fitness.  Wear properly fitted and padded protective equipment, such as padded soft collars, for participation in contact sports. PROGNOSIS  Recovery from cervical strain and sprain injuries is dependent on the extent of the injury. These injuries are usually curable in 1 week to 3 months with appropriate treatment.  RELATED COMPLICATIONS   Temporary numbness and weakness may occur if the nerve roots are damaged, and this may persist until the nerve has completely healed.  Chronic pain due to frequent recurrence of symptoms.  Prolonged healing, especially if activity is resumed too soon (before complete recovery). TREATMENT  Treatment initially involves the use of ice and medication to help reduce pain and inflammation. It is also important to perform strengthening and stretching exercises and modify activities that worsen symptoms so the injury does not get worse. These exercises may be performed at home or with a therapist. For patients who experience severe symptoms, a soft, padded collar may be recommended to be worn around  the neck.  Improving your posture may help reduce symptoms. Posture improvement includes pulling your chin and abdomen in while sitting or  standing. If you are sitting, sit in a firm chair with your buttocks against the back of the chair. While sleeping, try replacing your pillow with a small towel rolled to 2 inches in diameter, or use a cervical pillow or soft cervical collar. Poor sleeping positions delay healing.  For patients with nerve root damage, which causes numbness or weakness, the use of a cervical traction apparatus may be recommended. Surgery is rarely necessary for these injuries. However, cervical strain and sprains that are present at birth (congenital) may require surgery. MEDICATION   If pain medication is necessary, nonsteroidal anti-inflammatory medications, such as aspirin and ibuprofen, or other minor pain relievers, such as acetaminophen, are often recommended.  Do not take pain medication for 7 days before surgery.  Prescription pain relievers may be given if deemed necessary by your caregiver. Use only as directed and only as much as you need. HEAT AND COLD:   Cold treatment (icing) relieves pain and reduces inflammation. Cold treatment should be applied for 10 to 15 minutes every 2 to 3 hours for inflammation and pain and immediately after any activity that aggravates your symptoms. Use ice packs or an ice massage.  Heat treatment may be used prior to performing the stretching and strengthening activities prescribed by your caregiver, physical therapist, or athletic trainer. Use a heat pack or a warm soak. SEEK MEDICAL CARE IF:   Symptoms get worse or do not improve in 2 weeks despite treatment.  New, unexplained symptoms develop (drugs used in treatment may produce side effects). EXERCISES RANGE OF MOTION (ROM) AND STRETCHING EXERCISES - Cervical Strain and Sprain These exercises may help you when beginning to rehabilitate your injury. In order to successfully resolve your symptoms, you must improve your posture. These exercises are designed to help reduce the forward-head and rounded-shoulder posture  which contributes to this condition. Your symptoms may resolve with or without further involvement from your physician, physical therapist or athletic trainer. While completing these exercises, remember:   Restoring tissue flexibility helps normal motion to return to the joints. This allows healthier, less painful movement and activity.  An effective stretch should be held for at least 20 seconds, although you may need to begin with shorter hold times for comfort.  A stretch should never be painful. You should only feel a gentle lengthening or release in the stretched tissue. STRETCH- Axial Extensors  Lie on your back on the floor. You may bend your knees for comfort. Place a rolled-up hand towel or dish towel, about 2 inches in diameter, under the part of your head that makes contact with the floor.  Gently tuck your chin, as if trying to make a "double chin," until you feel a gentle stretch at the base of your head.  Hold __________ seconds. Repeat __________ times. Complete this exercise __________ times per day.  STRETCH - Axial Extension   Stand or sit on a firm surface. Assume a good posture: chest up, shoulders drawn back, abdominal muscles slightly tense, knees unlocked (if standing) and feet hip width apart.  Slowly retract your chin so your head slides back and your chin slightly lowers. Continue to look straight ahead.  You should feel a gentle stretch in the back of your head. Be certain not to feel an aggressive stretch since this can cause headaches later.  Hold for __________ seconds. Repeat __________  times. Complete this exercise __________ times per day. STRETCH - Cervical Side Bend   Stand or sit on a firm surface. Assume a good posture: chest up, shoulders drawn back, abdominal muscles slightly tense, knees unlocked (if standing) and feet hip width apart.  Without letting your nose or shoulders move, slowly tip your right / left ear to your shoulder until your feel a  gentle stretch in the muscles on the opposite side of your neck.  Hold __________ seconds. Repeat __________ times. Complete this exercise __________ times per day. STRETCH - Cervical Rotators   Stand or sit on a firm surface. Assume a good posture: chest up, shoulders drawn back, abdominal muscles slightly tense, knees unlocked (if standing) and feet hip width apart.  Keeping your eyes level with the ground, slowly turn your head until you feel a gentle stretch along the back and opposite side of your neck.  Hold __________ seconds. Repeat __________ times. Complete this exercise __________ times per day. RANGE OF MOTION - Neck Circles   Stand or sit on a firm surface. Assume a good posture: chest up, shoulders drawn back, abdominal muscles slightly tense, knees unlocked (if standing) and feet hip width apart.  Gently roll your head down and around from the back of one shoulder to the back of the other. The motion should never be forced or painful.  Repeat the motion 10-20 times, or until you feel the neck muscles relax and loosen. Repeat __________ times. Complete the exercise __________ times per day. STRENGTHENING EXERCISES - Cervical Strain and Sprain These exercises may help you when beginning to rehabilitate your injury. They may resolve your symptoms with or without further involvement from your physician, physical therapist, or athletic trainer. While completing these exercises, remember:   Muscles can gain both the endurance and the strength needed for everyday activities through controlled exercises.  Complete these exercises as instructed by your physician, physical therapist, or athletic trainer. Progress the resistance and repetitions only as guided.  You may experience muscle soreness or fatigue, but the pain or discomfort you are trying to eliminate should never worsen during these exercises. If this pain does worsen, stop and make certain you are following the directions  exactly. If the pain is still present after adjustments, discontinue the exercise until you can discuss the trouble with your clinician. STRENGTH - Cervical Flexors, Isometric  Face a wall, standing about 6 inches away. Place a small pillow, a ball about 6-8 inches in diameter, or a folded towel between your forehead and the wall.  Slightly tuck your chin and gently push your forehead into the soft object. Push only with mild to moderate intensity, building up tension gradually. Keep your jaw and forehead relaxed.  Hold 10 to 20 seconds. Keep your breathing relaxed.  Release the tension slowly. Relax your neck muscles completely before you start the next repetition. Repeat __________ times. Complete this exercise __________ times per day. STRENGTH- Cervical Lateral Flexors, Isometric   Stand about 6 inches away from a wall. Place a small pillow, a ball about 6-8 inches in diameter, or a folded towel between the side of your head and the wall.  Slightly tuck your chin and gently tilt your head into the soft object. Push only with mild to moderate intensity, building up tension gradually. Keep your jaw and forehead relaxed.  Hold 10 to 20 seconds. Keep your breathing relaxed.  Release the tension slowly. Relax your neck muscles completely before you start the next repetition. Repeat  __________ times. Complete this exercise __________ times per day. STRENGTH - Cervical Extensors, Isometric   Stand about 6 inches away from a wall. Place a small pillow, a ball about 6-8 inches in diameter, or a folded towel between the back of your head and the wall.  Slightly tuck your chin and gently tilt your head back into the soft object. Push only with mild to moderate intensity, building up tension gradually. Keep your jaw and forehead relaxed.  Hold 10 to 20 seconds. Keep your breathing relaxed.  Release the tension slowly. Relax your neck muscles completely before you start the next  repetition. Repeat __________ times. Complete this exercise __________ times per day. POSTURE AND BODY MECHANICS CONSIDERATIONS - Cervical Strain and Sprain Keeping correct posture when sitting, standing or completing your activities will reduce the stress put on different body tissues, allowing injured tissues a chance to heal and limiting painful experiences. The following are general guidelines for improved posture. Your physician or physical therapist will provide you with any instructions specific to your needs. While reading these guidelines, remember:  The exercises prescribed by your provider will help you have the flexibility and strength to maintain correct postures.  The correct posture provides the optimal environment for your joints to work. All of your joints have less wear and tear when properly supported by a spine with good posture. This means you will experience a healthier, less painful body.  Correct posture must be practiced with all of your activities, especially prolonged sitting and standing. Correct posture is as important when doing repetitive low-stress activities (typing) as it is when doing a single heavy-load activity (lifting). PROLONGED STANDING WHILE SLIGHTLY LEANING FORWARD When completing a task that requires you to lean forward while standing in one place for a long time, place either foot up on a stationary 2- to 4-inch high object to help maintain the best posture. When both feet are on the ground, the low back tends to lose its slight inward curve. If this curve flattens (or becomes too large), then the back and your other joints will experience too much stress, fatigue more quickly, and can cause pain.  RESTING POSITIONS Consider which positions are most painful for you when choosing a resting position. If you have pain with flexion-based activities (sitting, bending, stooping, squatting), choose a position that allows you to rest in a less flexed posture. You  would want to avoid curling into a fetal position on your side. If your pain worsens with extension-based activities (prolonged standing, working overhead), avoid resting in an extended position such as sleeping on your stomach. Most people will find more comfort when they rest with their spine in a more neutral position, neither too rounded nor too arched. Lying on a non-sagging bed on your side with a pillow between your knees, or on your back with a pillow under your knees will often provide some relief. Keep in mind, being in any one position for a prolonged period of time, no matter how correct your posture, can still lead to stiffness. WALKING Walk with an upright posture. Your ears, shoulders, and hips should all line up. OFFICE WORK When working at a desk, create an environment that supports good, upright posture. Without extra support, muscles fatigue and lead to excessive strain on joints and other tissues. CHAIR:  A chair should be able to slide under your desk when your back makes contact with the back of the chair. This allows you to work closely.  The chair's  height should allow your eyes to be level with the upper part of your monitor and your hands to be slightly lower than your elbows.  Body position:  Your feet should make contact with the floor. If this is not possible, use a foot rest.  Keep your ears over your shoulders. This will reduce stress on your neck and low back. Document Released: 04/02/2005 Document Revised: 08/17/2013 Document Reviewed: 07/15/2008 [facility] Patient Information 2015 [place], [place]. This information is not intended to replace advice given to you by your health care provider. Make sure you discuss any questions you have with your health care provider. Muscle Pain Muscle pain (myalgia) may be caused by many things, including:  Overuse or muscle strain, especially if you are not in shape. This is the most common cause of muscle  pain.  Injury.  Bruises.  Viruses, such as the flu.  Infectious diseases.  Fibromyalgia, which is a chronic condition that causes muscle tenderness, fatigue, and headache.  Autoimmune diseases, including lupus.  Certain drugs, including ACE inhibitors and statins. Muscle pain may be mild or severe. In most cases, the pain lasts only a short time and goes away without treatment. To diagnose the cause of your muscle pain, your health care provider will take your medical history. This means he or she will ask you when your muscle pain began and what has been happening. If you have not had muscle pain for very long, your health care provider may want to wait before doing much testing. If your muscle pain has lasted a long time, your health care provider may want to run tests right away. If your health care provider thinks your muscle pain may be caused by illness, you may need to have additional tests to rule out certain conditions.  Treatment for muscle pain depends on the cause. Home care is often enough to relieve muscle pain. Your health care provider may also prescribe anti-inflammatory medicine. HOME CARE INSTRUCTIONS Watch your condition for any changes. The following actions may help to lessen any discomfort you are feeling:  Only take over-the-counter or prescription medicines as directed by your health care provider.  Apply ice to the sore muscle:  Put ice in a plastic bag.  Place a towel between your skin and the bag.  Leave the ice on for 15-20 minutes, 3-4 times a day.  You may alternate applying hot and cold packs to the muscle as directed by your health care provider.  If overuse is causing your muscle pain, slow down your activities until the pain goes away.  Remember that it is normal to feel some muscle pain after starting a workout program. Muscles that have not been used often will be sore at first.  Do regular, gentle exercises if you are not usually  active.  Warm up before exercising to lower your risk of muscle pain.  Do not continue working out if the pain is very bad. Bad pain could mean you have injured a muscle. SEEK MEDICAL CARE IF:  Your muscle pain gets worse, and medicines do not help.  You have muscle pain that lasts longer than 3 days.  You have a rash or fever along with muscle pain.  You have muscle pain after a tick bite.  You have muscle pain while working out, even though you are in good physical condition.  You have redness, soreness, or swelling along with muscle pain.  You have muscle pain after starting a new medicine or changing the dose  of a medicine. SEEK IMMEDIATE MEDICAL CARE IF:  You have trouble breathing.  You have trouble swallowing.  You have muscle pain along with a stiff neck, fever, and vomiting.  You have severe muscle weakness or cannot move part of your body. MAKE SURE YOU:   Understand these instructions.  Will watch your condition.  Will get help right away if you are not doing well or get worse. Document Released: 02/22/2006 Document Revised: 04/07/2013 Document Reviewed: 01/27/2013 [facility] Patient Information 2015 [place], [place]. This information is not intended to replace advice given to you by your health care provider. Make sure you discuss any questions you have with your health care provider.

## 2013-11-17 ENCOUNTER — Ambulatory Visit (INDEPENDENT_AMBULATORY_CARE_PROVIDER_SITE_OTHER): Payer: BC Managed Care – PPO | Admitting: Internal Medicine

## 2013-11-17 ENCOUNTER — Encounter: Payer: Self-pay | Admitting: Internal Medicine

## 2013-11-17 VITALS — BP 129/90 | HR 91 | Temp 97.5°F | Ht 62.0 in | Wt 224.9 lb

## 2013-11-17 DIAGNOSIS — R9389 Abnormal findings on diagnostic imaging of other specified body structures: Secondary | ICD-10-CM | POA: Insufficient documentation

## 2013-11-17 DIAGNOSIS — I10 Essential (primary) hypertension: Secondary | ICD-10-CM

## 2013-11-17 DIAGNOSIS — M5430 Sciatica, unspecified side: Secondary | ICD-10-CM

## 2013-11-17 DIAGNOSIS — M5442 Lumbago with sciatica, left side: Secondary | ICD-10-CM

## 2013-11-17 LAB — URINE CULTURE
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
## 2013-11-17 NOTE — Patient Instructions (Signed)
Thank you for your visit today.   Please return to the internal medicine clinic in ~7-8 weeks after you have your repeat chest x-ray.     Your current medical regimen is effective;  continue present plan and take all medications as prescribed.    I have made the following additions/changes to your medications: None  Please be sure to bring all of your medications with you to every visit; this includes herbal supplements, vitamins, eye drops, and any over-the-counter medications.   Should you have any questions regarding your medications and/or any new or worsening symptoms, please be sure to call the clinic at [phone].   If you believe that you are suffering from a life threatening condition or one that may result in the loss of limb or function, then you should call 911 or proceed to the nearest Emergency Department.     A healthy lifestyle and preventative care can promote health and wellness.   Maintain regular health, dental, and eye exams.  Eat a healthy diet. Foods like vegetables, fruits, whole grains, low-fat dairy products, and lean protein foods contain the nutrients you need without too many calories. Decrease your intake of foods high in solid fats, added sugars, and salt. Get information about a proper diet from your caregiver, if necessary.  Regular physical exercise is one of the most important things you can do for your health. Most adults should get at least 150 minutes of moderate-intensity exercise (any activity that increases your heart rate and causes you to sweat) each week. In addition, most adults need muscle-strengthening exercises on 2 or more days a week.   Maintain a healthy weight. The body mass index (BMI) is a screening tool to identify possible weight problems. It provides an estimate of body fat based on height and weight. Your caregiver can help determine your BMI, and can help you achieve or maintain a healthy weight. For adults 20 years and  older:  A BMI below 18.5 is considered underweight.  A BMI of 18.5 to 24.9 is normal.  A BMI of 25 to 29.9 is considered overweight.  A BMI of 30 and above is considered obese.
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
## 2013-11-17 NOTE — Assessment & Plan Note (Signed)
Pt was concerned about her BP being too high.  In the clinic today it was 129/90.  She is only taking 12.5mg  of HCTZ and is doing well on this dose (was prescribed 25mg ).   -continue 12.5mg  HCTZ daily -continue to exercise

## 2013-11-18 NOTE — Progress Notes (Signed)
INTERNAL MEDICINE TEACHING ATTENDING ADDENDUM - Liani Caris, MD: I reviewed and discussed at the time of visit with the resident Dr. Gill, the patient's medical history, physical examination, diagnosis and results of pertinent tests and treatment and I agree with the patient's care as documented.  

## 2013-12-01 ENCOUNTER — Other Ambulatory Visit: Payer: Self-pay | Admitting: Internal Medicine

## 2013-12-01 DIAGNOSIS — M545 Low back pain, unspecified: Secondary | ICD-10-CM

## 2013-12-02 MED ORDER — HYDROCODONE-ACETAMINOPHEN 5-325 MG PO TABS: 1.0000 | ORAL_TABLET | Freq: Three times a day (TID) | ORAL | Status: DC | PRN

## 2013-12-02 NOTE — Telephone Encounter (Signed)
I am unable to locate a pain contract.  At the next visit with her PCP this will need to be done if the plan is to continue the Narco.  I refilled the medication for now as it looks like this was the intent at the last clinic visit.

## 2013-12-03 ENCOUNTER — Ambulatory Visit (INDEPENDENT_AMBULATORY_CARE_PROVIDER_SITE_OTHER): Payer: BC Managed Care – PPO | Admitting: Internal Medicine

## 2013-12-03 ENCOUNTER — Encounter: Payer: Self-pay | Admitting: Internal Medicine

## 2013-12-03 VITALS — BP 142/89 | HR 86 | Temp 98.4°F | Ht 62.0 in | Wt 226.0 lb

## 2013-12-03 DIAGNOSIS — M549 Dorsalgia, unspecified: Secondary | ICD-10-CM

## 2013-12-03 DIAGNOSIS — G8929 Other chronic pain: Secondary | ICD-10-CM | POA: Diagnosis not present
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
## 2013-12-04 ENCOUNTER — Other Ambulatory Visit: Payer: Self-pay | Admitting: Internal Medicine

## 2013-12-07 MED ORDER — CYCLOBENZAPRINE HCL 10 MG PO TABS: 10.0000 mg | ORAL_TABLET | Freq: Two times a day (BID) | ORAL | Status: DC | PRN

## 2013-12-08 NOTE — Progress Notes (Signed)
Internal Medicine Clinic Attending Date of visit: 12/03/2013   Case discussed with Dr. Gordy Levan soon after the resident saw the patient.  We reviewed the resident's history and exam and pertinent patient test results.  I agree with the assessment, diagnosis, and plan of care documented in the resident's note.

## 2013-12-23 ENCOUNTER — Other Ambulatory Visit: Payer: Self-pay | Admitting: Internal Medicine

## 2013-12-23 MED ORDER — CYCLOBENZAPRINE HCL 10 MG PO TABS: 10.0000 mg | ORAL_TABLET | Freq: Two times a day (BID) | ORAL | Status: DC | PRN

## 2014-01-05 ENCOUNTER — Ambulatory Visit (HOSPITAL_COMMUNITY)
Admission: RE | Admit: 2014-01-05 | Discharge: 2014-01-05 | Disposition: A | Discharge status: Home / Self Care | Payer: BC Managed Care – PPO | Source: Ambulatory Visit | Attending: Internal Medicine | Admitting: Internal Medicine

## 2014-01-05 ENCOUNTER — Ambulatory Visit (HOSPITAL_COMMUNITY)
Admission: RE | Admit: 2014-01-05 | Discharge: 2014-01-05 | Disposition: A | Discharge status: Home / Self Care | Payer: BC Managed Care – PPO | Source: Ambulatory Visit | Attending: Emergency Medicine | Admitting: Emergency Medicine

## 2014-01-05 ENCOUNTER — Ambulatory Visit: Payer: BC Managed Care – PPO | Admitting: Internal Medicine

## 2014-01-05 DIAGNOSIS — R0602 Shortness of breath: Secondary | ICD-10-CM | POA: Insufficient documentation

## 2014-01-05 DIAGNOSIS — R9389 Abnormal findings on diagnostic imaging of other specified body structures: Secondary | ICD-10-CM | POA: Diagnosis not present

## 2014-01-07 ENCOUNTER — Encounter: Payer: Self-pay | Admitting: Internal Medicine

## 2014-01-07 ENCOUNTER — Ambulatory Visit (INDEPENDENT_AMBULATORY_CARE_PROVIDER_SITE_OTHER): Payer: BC Managed Care – PPO | Admitting: Internal Medicine

## 2014-01-07 VITALS — BP 129/78 | HR 91 | Temp 97.4°F | Ht 62.0 in | Wt 226.4 lb

## 2014-01-07 DIAGNOSIS — M25512 Pain in left shoulder: Secondary | ICD-10-CM

## 2014-01-07 DIAGNOSIS — M549 Dorsalgia, unspecified: Secondary | ICD-10-CM

## 2014-01-07 DIAGNOSIS — M25519 Pain in unspecified shoulder: Secondary | ICD-10-CM

## 2014-01-07 DIAGNOSIS — G8929 Other chronic pain: Secondary | ICD-10-CM

## 2014-01-07 DIAGNOSIS — R300 Dysuria: Secondary | ICD-10-CM

## 2014-01-07 LAB — POCT URINALYSIS DIPSTICK
BILIRUBIN UA: NEGATIVE
GLUCOSE UA: NEGATIVE
KETONES UA: NEGATIVE
Nitrite, UA: NEGATIVE
PH UA: 5.5
Protein, UA: NEGATIVE
SPEC GRAV UA: 1.02
Urobilinogen, UA: 0.2

## 2014-01-07 NOTE — Assessment & Plan Note (Signed)
With on increased urinary frequency, burning sensation comes and goes for months now.  Pt states that sometimes she voluntarily holds her urine too long to prevent going to the bathroom when she is at church, shopping,etc.   Urine dipstick with small leuc, negative nitrite.  -Urine culture -Continue monitoring--pt advised to void as needed
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
## 2014-01-07 NOTE — Assessment & Plan Note (Addendum)
She continues to have ow back pain with radiculopathy but no paresthesias or changes in bowel/bladder changes.  She has follow up with Orthopedic Surgery (Dr. Rolena Infante at [facility]) tomorrow.  -Continue Ibuprofen 800mg  q6hr PRN for pain

## 2014-01-07 NOTE — Assessment & Plan Note (Signed)
She has pain in her left shoulder today.  She will continue taking Ibuprofen PRN, has follow up with Orthopedic surgery.

## 2014-01-07 NOTE — Patient Instructions (Addendum)
-  Continue taking Ibuprofen for the pain, you may take up to 800mg  every 6 hours as needed for the pain.  -Follow up with Dr. Rolena Infante tomorrow.  -Follow up with Dr. Ethelene Hal in 1-2 months for blood pressure check and routine visit.   Please bring your medicines with you each time you come.   Medicines may be  Eye drops  Herbal   Vitamins  Pills  Seeing these help [place] take care of you.

## 2014-01-10 LAB — URINE CULTURE: Colony Count: 70000

## 2014-01-11 NOTE — Progress Notes (Signed)
Medicine attending: Medical history, presenting problems, physical findings, and medications, reviewed with resident physician Dr. Hayes Ludwig on the day of the patient's clinic visit and I concurred with her evaluation and management plan. Murriel Hopper, M.D., [place]

## 2014-02-16 ENCOUNTER — Telehealth: Payer: Self-pay | Admitting: *Deleted

## 2014-02-16 NOTE — Telephone Encounter (Signed)
I tried calling the patient's mobile phone twice and home phone once at 4:47 pm. Will reattempt tomorrow.  Lottie Mussel, MD 02/16/14 4:47PM

## 2014-02-16 NOTE — Telephone Encounter (Signed)
Pt is very concerned about chest xray, she would like for you to call her to reassure her and to discuss f/u plans. Please call her at [phone]
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
## 2014-03-05 ENCOUNTER — Ambulatory Visit: Payer: BC Managed Care – PPO | Admitting: Dietician

## 2014-04-20 ENCOUNTER — Encounter: Payer: Self-pay | Admitting: Internal Medicine

## 2014-04-20 ENCOUNTER — Encounter: Payer: Self-pay | Admitting: Dietician

## 2014-04-20 ENCOUNTER — Ambulatory Visit (INDEPENDENT_AMBULATORY_CARE_PROVIDER_SITE_OTHER): Payer: BC Managed Care – PPO | Admitting: Internal Medicine

## 2014-04-20 ENCOUNTER — Ambulatory Visit (INDEPENDENT_AMBULATORY_CARE_PROVIDER_SITE_OTHER): Payer: BC Managed Care – PPO | Admitting: Dietician

## 2014-04-20 VITALS — BP 121/54 | HR 90 | Temp 98.0°F | Ht 62.0 in | Wt 227.5 lb

## 2014-04-20 DIAGNOSIS — I10 Essential (primary) hypertension: Secondary | ICD-10-CM

## 2014-04-20 DIAGNOSIS — R7309 Other abnormal glucose: Secondary | ICD-10-CM | POA: Diagnosis not present

## 2014-04-20 DIAGNOSIS — R7303 Prediabetes: Secondary | ICD-10-CM

## 2014-04-20 DIAGNOSIS — M549 Dorsalgia, unspecified: Secondary | ICD-10-CM

## 2014-04-20 DIAGNOSIS — G8929 Other chronic pain: Secondary | ICD-10-CM
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
## 2014-04-20 NOTE — Assessment & Plan Note (Addendum)
Pt saw [facility] on 12/26.  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  Also L2-3 whoed small left foraminal protrusion which displaces the L2 DRG.  Mild disc bulging abut the left L3 root.  Plan is to try PT and follow up in 3 months.   -continue to monitor

## 2014-04-20 NOTE — Assessment & Plan Note (Signed)
-  referral to [facility]

## 2014-04-20 NOTE — Progress Notes (Signed)
Medical Nutrition Therapy:  Appt start time: 900 am end time:  925 am   Assessment:  Primary concerns today: Weight management and prediabetes.  Ms. Wall is here with a supportive person, Ray. Her weight is  ~1.5# less than last visit in 08/2013.She has met her goal of totally cutting out drinking Mountain Dew. She is eating pasta salad, fried meats, fruits.  No new A1C  Progress Towards Goal(s):  Some progress.   Nutritional Diagnosis:  NB-1.1 Food and nutrition-related knowledge deficit As related to lack of prior knowledge about weight control/loss imrpoving  As evidenced by her questions.    Intervention:  Nutrition education about weight loss, what to eat, importance of exercise and goal setting.   Her goals are to eat more vegetables, bake more foods and she is considering buying a scale to be able to self monitor her weight. She desires group weight loss session when available  Monitoring/Evaluation:  Dietary intake, exercise, and body weight in 2 week(s).  

## 2014-04-20 NOTE — Assessment & Plan Note (Signed)
-  referral to [facility] for education and lifestyle modification

## 2014-04-20 NOTE — Patient Instructions (Signed)
Thank you for your visit today.   Please return to the internal medicine clinic in 3 month(s) or sooner if needed.   Please exercise and lose weight.  I will set you up with our Diabetes Educator who will be able to help you. Your BP is OK, continue to take your medication daily.   Please be sure to bring all of your medications with you to every visit; this includes herbal supplements, vitamins, eye drops, and any over-the-counter medications.   Should you have any questions regarding your medications and/or any new or worsening symptoms, please be sure to call the clinic at [phone].   If you believe that you are suffering from a life threatening condition or one that may result in the loss of limb or function, then you should call 911 or proceed to the nearest Emergency Department.     A healthy lifestyle and preventative care can promote health and wellness.   Maintain regular health, dental, and eye exams.  Eat a healthy diet. Foods like vegetables, fruits, whole grains, low-fat dairy products, and lean protein foods contain the nutrients you need without too many calories. Decrease your intake of foods high in solid fats, added sugars, and salt. Get information about a proper diet from your caregiver, if necessary.  Regular physical exercise is one of the most important things you can do for your health. Most adults should get at least 150 minutes of moderate-intensity exercise (any activity that increases your heart rate and causes you to sweat) each week. In addition, most adults need muscle-strengthening exercises on 2 or more days a week.   Maintain a healthy weight. The body mass index (BMI) is a screening tool to identify possible weight problems. It provides an estimate of body fat based on height and weight. Your caregiver can help determine your BMI, and can help you achieve or maintain a healthy weight. For adults 20 years and older:  A BMI below 18.5 is considered  underweight.  A BMI of 18.5 to 24.9 is normal.  A BMI of 25 to 29.9 is considered overweight.  A BMI of 30 and above is considered obese.   Type 2 Diabetes Mellitus Type 2 diabetes mellitus, often simply referred to as type 2 diabetes, is a long-lasting (chronic) disease. In type 2 diabetes, the pancreas does not make enough insulin (a hormone), the cells are less responsive to the insulin that is made (insulin resistance), or both. Normally, insulin moves sugars from food into the tissue cells. The tissue cells use the sugars for energy. The lack of insulin or the lack of normal response to insulin causes excess sugars to build up in the blood instead of going into the tissue cells. As a result, high blood sugar (hyperglycemia) develops. The effect of high sugar (glucose) levels can cause many complications. Type 2 diabetes was also previously called adult-onset diabetes, but it can occur at any age.  RISK FACTORS  A person is predisposed to developing type 2 diabetes if someone in the family has the disease and also has one or more of the following primary risk factors:  Overweight.  An inactive lifestyle.  A history of consistently eating high-calorie foods. Maintaining a normal weight and regular physical activity can reduce the chance of developing type 2 diabetes. SYMPTOMS  A person with type 2 diabetes may not show symptoms initially. The symptoms of type 2 diabetes appear slowly. The symptoms include:  Increased thirst (polydipsia).  Increased urination (polyuria).  Increased  urination during the night (nocturia).  Weight loss. This weight loss may be rapid.  Frequent, recurring infections.  Tiredness (fatigue).  Weakness.  Vision changes, such as blurred vision.  Fruity smell to your breath.  Abdominal pain.  Nausea or vomiting.  Cuts or bruises which are slow to heal.  Tingling or numbness in the hands or feet. DIAGNOSIS Type 2 diabetes is frequently not  diagnosed until complications of diabetes are present. Type 2 diabetes is diagnosed when symptoms or complications are present and when blood glucose levels are increased. Your blood glucose level may be checked by one or more of the following blood tests:  A fasting blood glucose test. You will not be allowed to eat for at least 8 hours before a blood sample is taken.  A random blood glucose test. Your blood glucose is checked at any time of the day regardless of when you ate.  A hemoglobin A1c blood glucose test. A hemoglobin A1c test provides information about blood glucose control over the previous 3 months.  An oral glucose tolerance test (OGTT). Your blood glucose is measured after you have not eaten (fasted) for 2 hours and then after you drink a glucose-containing beverage. TREATMENT   You may need to take insulin or diabetes medicine daily to keep blood glucose levels in the desired range.  If you use insulin, you may need to adjust the dosage depending on the carbohydrates that you eat with each meal or snack. The treatment goal is to maintain the before meal blood sugar (preprandial glucose) level at 70-130 mg/dL. HOME CARE INSTRUCTIONS   Have your hemoglobin A1c level checked twice a year.  Perform daily blood glucose monitoring as directed by your health care provider.  Monitor urine ketones when you are ill and as directed by your health care provider.  Take your diabetes medicine or insulin as directed by your health care provider to maintain your blood glucose levels in the desired range.  Never run out of diabetes medicine or insulin. It is needed every day.  If you are using insulin, you may need to adjust the amount of insulin given based on your intake of carbohydrates. Carbohydrates can raise blood glucose levels but need to be included in your diet. Carbohydrates provide vitamins, minerals, and fiber which are an essential part of a healthy diet. Carbohydrates are  found in fruits, vegetables, whole grains, dairy products, legumes, and foods containing added sugars.  Eat healthy foods. You should make an appointment to see a registered dietitian to help you create an eating plan that is right for you.  Lose weight if you are overweight.  Carry a medical alert card or wear your medical alert jewelry.  Carry a 15-gram carbohydrate snack with you at all times to treat low blood glucose (hypoglycemia). Some examples of 15-gram carbohydrate snacks include:  Glucose tablets, 3 or 4.  Glucose gel, 15-gram tube.  Raisins, 2 tablespoons (24 grams).  Jelly beans, 6.  Animal crackers, 8.  Regular pop, 4 ounces (120 mL).  Gummy treats, 9.  Recognize hypoglycemia. Hypoglycemia occurs with blood glucose levels of 70 mg/dL and below. The risk for hypoglycemia increases when fasting or skipping meals, during or after intense exercise, and during sleep. Hypoglycemia symptoms can include:  Tremors or shakes.  Decreased ability to concentrate.  Sweating.  Increased heart rate.  Headache.  Dry mouth.  Hunger.  Irritability.  Anxiety.  Restless sleep.  Altered speech or coordination.  Confusion.  Treat hypoglycemia promptly.  If you are alert and able to safely swallow, follow the 15:15 rule:  Take 15-20 grams of rapid-acting glucose or carbohydrate. Rapid-acting options include glucose gel, glucose tablets, or 4 ounces (120 mL) of fruit juice, regular soda, or low-fat milk.  Check your blood glucose level 15 minutes after taking the glucose.  Take 15-20 grams more of glucose if the repeat blood glucose level is still 70 mg/dL or below.  Eat a meal or snack within 1 hour once blood glucose levels return to normal.  Be alert to feeling very thirsty and urinating more frequently than usual, which are early signs of hyperglycemia. An early awareness of hyperglycemia allows for prompt treatment. Treat hyperglycemia as directed by your health  care provider.  Engage in at least 150 minutes of moderate-intensity physical activity a week, spread over at least 3 days of the week or as directed by your health care provider. In addition, you should engage in resistance exercise at least 2 times a week or as directed by your health care provider. Try to spend no more than 90 minutes at one time inactive.  Adjust your medicine and food intake as needed if you start a new exercise or sport.  Follow your sick-day plan anytime you are unable to eat or drink as usual.  Do not use any tobacco products including cigarettes, chewing tobacco, or electronic cigarettes. If you need help quitting, ask your health care provider.  Limit alcohol intake to no more than 1 drink per day for nonpregnant women and 2 drinks per day for men. You should drink alcohol only when you are also eating food. Talk with your health care provider whether alcohol is safe for you. Tell your health care provider if you drink alcohol several times a week.  Keep all follow-up visits as directed by your health care provider. This is important.  Schedule an eye exam soon after the diagnosis of type 2 diabetes and then annually.  Perform daily skin and foot care. Examine your skin and feet daily for cuts, bruises, redness, nail problems, bleeding, blisters, or sores. A foot exam by a health care provider should be done annually.  Brush your teeth and gums at least twice a day and floss at least once a day. Follow up with your dentist regularly.  Share your diabetes management plan with your workplace or school.  Stay up-to-date with immunizations. It is recommended that people with diabetes who are over 17 years old get the pneumonia vaccine. In some cases, two separate shots may be given. Ask your health care provider if your pneumonia vaccination is up-to-date.  Learn to manage stress.  Obtain ongoing diabetes education and support as needed.  Participate in or seek  rehabilitation as needed to maintain or improve independence and quality of life. Request a physical or occupational therapy referral if you are having foot or hand numbness, or difficulties with grooming, dressing, eating, or physical activity. SEEK MEDICAL CARE IF:   You are unable to eat food or drink fluids for more than 6 hours.  You have nausea and vomiting for more than 6 hours.  Your blood glucose level is over 240 mg/dL.  There is a change in mental status.  You develop an additional serious illness.  You have diarrhea for more than 6 hours.  You have been sick or have had a fever for a couple of days and are not getting better.  You have pain during any physical activity.  [place]  CARE IF:  You have difficulty breathing.  You have moderate to large ketone levels. MAKE SURE YOU:  Understand these instructions.  Will watch your condition.  Will get help right away if you are not doing well or get worse. Document Released: 04/02/2005 Document Revised: 08/17/2013 Document Reviewed: 10/30/2011 [facility] Patient Information 2015 [place], [place]. This information is not intended to replace advice given to you by your health care provider. Make sure you discuss any questions you have with your health care provider.

## 2014-04-20 NOTE — Assessment & Plan Note (Signed)
BP OK today (121/54).   -continue current med of HCTZ 25mg  (takes 12.5mg  daily)

## 2014-04-21 NOTE — Patient Instructions (Signed)
Dear Ms. Hardacre,   It was good to meet with you about your progress with weight loss and preventing diabetes.   I have the utmost confidence in your ability to meet your goals.  Please call or schedule a follow up in 2 weeks or at your convenience.  Take care!  Sincerely, [name] Plyler [phone]

## 2014-04-21 NOTE — Progress Notes (Signed)
Internal Medicine Clinic Attending  Case discussed with Dr. Gill soon after the resident saw the patient.  We reviewed the resident's history and exam and pertinent patient test results.  I agree with the assessment, diagnosis, and plan of care documented in the resident's note.  

## 2014-05-26 ENCOUNTER — Encounter: Payer: BC Managed Care – PPO | Admitting: Internal Medicine

## 2014-06-02 ENCOUNTER — Encounter: Payer: Self-pay | Admitting: Internal Medicine

## 2014-06-02 ENCOUNTER — Ambulatory Visit (INDEPENDENT_AMBULATORY_CARE_PROVIDER_SITE_OTHER): Payer: BC Managed Care – PPO | Admitting: Internal Medicine

## 2014-06-02 VITALS — BP 140/81 | HR 86 | Temp 97.5°F | Resp 20 | Ht 61.5 in | Wt 228.8 lb

## 2014-06-02 DIAGNOSIS — S299XXA Unspecified injury of thorax, initial encounter: Secondary | ICD-10-CM | POA: Diagnosis not present

## 2014-06-02 DIAGNOSIS — W19XXXA Unspecified fall, initial encounter: Secondary | ICD-10-CM

## 2014-06-02 MED ORDER — ACETAMINOPHEN 500 MG PO TABS: 500.0000 mg | ORAL_TABLET | Freq: Four times a day (QID) | ORAL | Status: DC | PRN

## 2014-06-02 NOTE — Patient Instructions (Signed)
General Instructions: -You may take Tylenol 500mg  1-2 tablets every 6 hours as needed for the breast tenderness. You may take up to 8 tablets of Tylenol 500mg  per 24 hours.  -Apply ice pack to the tender area as needed for 10-20 minutes.  -You have been referred to the breast center for [place] of your breast. You may call them with the appointment date and time.  -Follow up with [place] in 2 months or sooner as needed.    Please bring your medicines with you each time you come to clinic.  Medicines may include prescription medications, over-the-counter medications, herbal remedies, eye drops, vitamins, or other pills.   Progress Toward Treatment Goals:  Treatment Goal 10/14/2013  Blood pressure at goal    Self Care Goals & Plans:  Self Care Goal 06/02/2014  Manage my medications take my medicines as prescribed; bring my medications to every visit; refill my medications on time  Monitor my health keep track of my blood pressure  Eat healthy foods eat foods that are low in salt; eat baked foods instead of fried foods; drink diet soda or water instead of juice or soda  Be physically active -  Meeting treatment goals -    No flowsheet data found.   Care Management & Community Referrals:  Referral 10/14/2013  Referrals made for care management support -  Referrals made to community resources none

## 2014-06-04 DIAGNOSIS — S299XXA Unspecified injury of thorax, initial encounter: Secondary | ICD-10-CM | POA: Insufficient documentation

## 2014-06-04 NOTE — Progress Notes (Signed)
   Subjective:    Patient ID: Danielle Mason, female    DOB: 07-16-1960, 54 y.o.   MRN: [id number]  HPI Danielle Mason is a 54 yr old woman with PMH of HTN, IBS, obesity, who presents for evaluation of left breast pain. She states that she tripped and fell on 2/11 while going up some steps and hit her left face and her left breast. She had no apparent injuries at that time with no bruise on her face but notes that her left breast has been tender since then. She denies changes in the breast appearance with no nipple discharge, no bruise, no hematoma, and no skin changes.  She plans to go out of town from 2/18 and will return on 2/21.   Review of Systems  Constitutional: Negative for fever, chills, activity change and appetite change.  Respiratory: Negative for cough and shortness of breath.   Cardiovascular: Negative for chest pain and leg swelling.  Gastrointestinal: Negative for abdominal pain.  Genitourinary: Negative for dysuria.  Musculoskeletal: Negative for myalgias and back pain.  Neurological: Negative for dizziness and light-headedness.  Psychiatric/Behavioral: Negative for agitation.       Objective:   Physical Exam  Constitutional: She is oriented to person, place, and time. She appears well-developed and well-nourished. No distress.  HENT:  Head: Normocephalic.  Breast exam: She has tenderness at 9 o'clock with no hematoma or distinct nodule felt. She has no bruising, no skin changes, no nipple discharge.   Cardiovascular: Normal rate.   Pulmonary/Chest: Effort normal. No respiratory distress. She has no wheezes. She has no rales.  Abdominal: Soft. There is no tenderness.  Musculoskeletal: She exhibits no edema or tenderness.  Neurological: She is alert and oriented to person, place, and time. Coordination normal.  Skin: Skin is warm and dry. She is not diaphoretic.  Psychiatric: She has a normal mood and affect.  Nursing note and vitals reviewed.         Assessment &  Plan:

## 2014-06-04 NOTE — Progress Notes (Signed)
INTERNAL MEDICINE TEACHING ATTENDING ADDENDUM - Jolisa Intriago, MD: I reviewed and discussed at the time of visit with the resident Dr. Kennerly, the patient's medical history, physical examination, diagnosis and results of pertinent tests and treatment and I agree with the patient's care as documented.  

## 2014-06-04 NOTE — Assessment & Plan Note (Signed)
She likely has soft tissue injury with no hematoma per exam. Nonetheless, she has tenderness at 9 o'clock of the left breast that is not improving.  -Ordered US of left breast, pt states that she will have it done on 2/22.  -Tylenol 500mg  q6hr PRN for pain, ice packs to the tender area q1h PRN for pain. Pt advised to wear sports bras for comfort.

## 2014-08-12 ENCOUNTER — Encounter: Payer: Self-pay | Admitting: *Deleted

## 2014-09-01 ENCOUNTER — Other Ambulatory Visit: Payer: Self-pay | Admitting: Internal Medicine

## 2014-11-09 ENCOUNTER — Telehealth: Payer: Self-pay | Admitting: Internal Medicine

## 2014-11-09 NOTE — Telephone Encounter (Signed)
Call to patient to confirm appointment for 11/10/14 at 1:30 lmtcb

## 2014-11-10 ENCOUNTER — Ambulatory Visit (INDEPENDENT_AMBULATORY_CARE_PROVIDER_SITE_OTHER): Payer: BC Managed Care – PPO | Admitting: Internal Medicine

## 2014-11-10 ENCOUNTER — Encounter: Payer: Self-pay | Admitting: Internal Medicine

## 2014-11-10 VITALS — BP 125/82 | HR 82 | Temp 97.5°F | Ht 61.5 in | Wt 228.3 lb

## 2014-11-10 DIAGNOSIS — E669 Obesity, unspecified: Secondary | ICD-10-CM

## 2014-11-10 DIAGNOSIS — I10 Essential (primary) hypertension: Secondary | ICD-10-CM | POA: Diagnosis not present

## 2014-11-10 DIAGNOSIS — R7309 Other abnormal glucose: Secondary | ICD-10-CM

## 2014-11-10 DIAGNOSIS — R7303 Prediabetes: Secondary | ICD-10-CM

## 2014-11-10 DIAGNOSIS — E785 Hyperlipidemia, unspecified: Secondary | ICD-10-CM | POA: Diagnosis not present

## 2014-11-10 DIAGNOSIS — M545 Low back pain, unspecified: Secondary | ICD-10-CM

## 2014-11-10 DIAGNOSIS — K219 Gastro-esophageal reflux disease without esophagitis: Secondary | ICD-10-CM

## 2014-11-10 LAB — LIPID PANEL
CHOLESTEROL: 215 mg/dL — AB (ref 125–200)
HDL: 35 mg/dL — ABNORMAL LOW (ref >=46)
LDL Cholesterol: 129 mg/dL (ref <130)
Total CHOL/HDL Ratio: 6.1 Ratio — ABNORMAL HIGH (ref <=5.0)
Triglycerides: 255 mg/dL — ABNORMAL HIGH (ref <150)
VLDL: 51 mg/dL — AB (ref <30)

## 2014-11-10 LAB — GLUCOSE, CAPILLARY: GLUCOSE-CAPILLARY: 88 mg/dL (ref 65–99)

## 2014-11-10 LAB — POCT GLYCOSYLATED HEMOGLOBIN (HGB A1C): Hemoglobin A1C: 6.4

## 2014-11-10 MED ORDER — OMEPRAZOLE 20 MG PO CPDR: 20.0000 mg | DELAYED_RELEASE_CAPSULE | Freq: Every day | ORAL | Status: DC

## 2014-11-10 MED ORDER — IBUPROFEN 600 MG PO TABS: 600.0000 mg | ORAL_TABLET | Freq: Three times a day (TID) | ORAL | Status: DC | PRN

## 2014-11-10 MED ORDER — HYDROCHLOROTHIAZIDE 12.5 MG PO TABS: 12.5000 mg | ORAL_TABLET | Freq: Every morning | ORAL | Status: DC
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
## 2014-11-10 NOTE — Patient Instructions (Signed)
I will call you about the results of today's lab tests if there is anything we need to do, otherwise if you don't hear from me everything looks okay.   Please follow up with me in 6 months. Please schedule an appointment Danielle Mason in the next month for pre-diabetes education.

## 2014-11-14 ENCOUNTER — Encounter: Payer: Self-pay | Admitting: Internal Medicine

## 2014-11-14 NOTE — Assessment & Plan Note (Addendum)
Cholesterol 125 - 200 mg/dL 215 (H)      Triglycerides <150 mg/dL 255 (H)   HDL >=46 mg/dL 35 (L)   Total CHOL/HDL Ratio <=5.0 Ratio 6.1 (H)   VLDL <30 mg/dL 51 (H)   LDL Cholesterol <130 mg/dL 129       10 year risk via Framingham calculator 10.6%, ACC/AHA calculator 7.7%  Patient uninterested in starting any therapy at this time, want to continue to try lifestyle modification. Will continue to monitor.

## 2014-11-14 NOTE — Assessment & Plan Note (Signed)
Weight stable since last visit, previous A1c 6.3. Patient reports exercising every other day and walking more with improvement in her diet. She is not interested in starting any medications at this time. Saw [name] Plyler in January, was suppose to have follow up in 2 weeks but never went. Referred again for further education and lifestyle modification education.  -A1c 6.4 today -F/U in 6 months, recheck A1c.

## 2014-11-14 NOTE — Assessment & Plan Note (Signed)
  BP Readings from Last 2 Encounters:  11/10/14 125/82  06/02/14 140/81   Assessment: Blood pressure control: controlled Progress toward BP goal: at goal  Plan: Medications: continue current medications - hctz 12.5 daily

## 2014-11-14 NOTE — Assessment & Plan Note (Signed)
Well controlled. Refilled PPI.

## 2014-11-22 NOTE — Progress Notes (Signed)
Medicine attending: I personally interviewed and briefly examined this patient, and reviewed pertinent clinical and laboratory  data  with resident physician Dr.Nathan Boswell on the day of the patient visit and we discussed a management plan.

## 2014-11-22 NOTE — Addendum Note (Signed)
Addended by: Sunbury Lions on: 11/22/2014 04:45 PM   Modules accepted: Level of Service

## 2015-01-03 DIAGNOSIS — K219 Gastro-esophageal reflux disease without esophagitis: Secondary | ICD-10-CM | POA: Insufficient documentation

## 2015-01-03 DIAGNOSIS — M5416 Radiculopathy, lumbar region: Secondary | ICD-10-CM | POA: Diagnosis not present

## 2015-01-03 DIAGNOSIS — I10 Essential (primary) hypertension: Secondary | ICD-10-CM | POA: Insufficient documentation

## 2015-01-03 DIAGNOSIS — M79605 Pain in left leg: Secondary | ICD-10-CM | POA: Diagnosis not present

## 2015-01-03 DIAGNOSIS — Z8614 Personal history of Methicillin resistant Staphylococcus aureus infection: Secondary | ICD-10-CM | POA: Diagnosis not present

## 2015-01-03 DIAGNOSIS — M545 Low back pain: Secondary | ICD-10-CM | POA: Diagnosis present

## 2015-01-03 DIAGNOSIS — Z880 Allergy status to penicillin: Secondary | ICD-10-CM | POA: Insufficient documentation

## 2015-01-03 DIAGNOSIS — Z7982 Long term (current) use of aspirin: Secondary | ICD-10-CM | POA: Insufficient documentation

## 2015-01-03 DIAGNOSIS — Z91040 Latex allergy status: Secondary | ICD-10-CM | POA: Diagnosis not present

## 2015-01-03 DIAGNOSIS — Z79899 Other long term (current) drug therapy: Secondary | ICD-10-CM | POA: Diagnosis not present

## 2015-01-04 ENCOUNTER — Emergency Department (HOSPITAL_COMMUNITY)
Admission: EM | Admit: 2015-01-04 | Discharge: 2015-01-04 | Disposition: A | Discharge status: Home / Self Care | Payer: BC Managed Care – PPO | Attending: Emergency Medicine | Admitting: Emergency Medicine

## 2015-01-04 ENCOUNTER — Encounter (HOSPITAL_COMMUNITY): Payer: Self-pay | Admitting: Emergency Medicine

## 2015-01-04 DIAGNOSIS — M5416 Radiculopathy, lumbar region: Secondary | ICD-10-CM

## 2015-01-04 MED ORDER — METHOCARBAMOL 500 MG PO TABS: 500.0000 mg | ORAL_TABLET | Freq: Four times a day (QID) | ORAL | Status: DC

## 2015-01-04 MED ORDER — METHOCARBAMOL 500 MG PO TABS
500.0000 mg | ORAL_TABLET | Freq: Once | ORAL | Status: AC
  Administered 2015-01-04: 500 mg via ORAL
  Filled 2015-01-04: qty 1

## 2015-01-04 MED ORDER — OXYCODONE-ACETAMINOPHEN 5-325 MG PO TABS
1.0000 | ORAL_TABLET | Freq: Once | ORAL | Status: AC
  Administered 2015-01-04: 1 via ORAL
  Filled 2015-01-04: qty 1

## 2015-01-04 MED ORDER — OXYCODONE-ACETAMINOPHEN 5-325 MG PO TABS: 1.0000 | ORAL_TABLET | Freq: Four times a day (QID) | ORAL | Status: DC | PRN
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
## 2015-01-04 NOTE — Discharge Instructions (Signed)
Please read and follow all provided instructions.  Your diagnoses today include:  1. Lumbar radiculopathy    Tests performed today include:  Vital signs - see below for your results today  Medications prescribed:   Percocet (oxycodone/acetaminophen) - narcotic pain medication  DO NOT drive or perform any activities that require you to be awake and alert because this medicine can make you drowsy. BE VERY CAREFUL not to take multiple medicines containing Tylenol (also called acetaminophen). Doing so can lead to an overdose which can damage your liver and cause liver failure and possibly death.   Robaxin (methocarbamol) - muscle relaxer medication  DO NOT drive or perform any activities that require you to be awake and alert because this medicine can make you drowsy.   Take any prescribed medications only as directed.  Home care instructions:   Follow any educational materials contained in this packet  Please rest, use ice or heat on your back for the next several days  Do not lift, push, pull anything more than 10 pounds for the next week  Follow-up instructions: Please follow-up with your primary care provider or orthopedic surgeon in the next 1 week for further evaluation of your symptoms.   Return instructions:  SEEK IMMEDIATE MEDICAL ATTENTION IF YOU HAVE:  New numbness, tingling, weakness, or problem with the use of your arms or legs  Severe back pain not relieved with medications  Loss control of your bowels or bladder  Increasing pain in any areas of the body (such as chest or abdominal pain)  Shortness of breath, dizziness, or fainting.   Worsening nausea (feeling sick to your stomach), vomiting, fever, or sweats  Any other emergent concerns regarding your health   Additional Information:  Your vital signs today were: BP 133/79 mmHg   Pulse 90   Temp(Src) 97.8 F (36.6 C) (Oral)   Resp 14   SpO2 96% If your blood pressure (BP) was elevated above 135/85  this visit, please have this repeated by your doctor within one month. --------------
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
## 2015-01-21 ENCOUNTER — Other Ambulatory Visit: Payer: Self-pay | Admitting: Orthopedic Surgery

## 2015-01-21 DIAGNOSIS — M545 Low back pain, unspecified: Secondary | ICD-10-CM

## 2015-01-28 ENCOUNTER — Other Ambulatory Visit: Payer: BC Managed Care – PPO

## 2015-01-28 ENCOUNTER — Ambulatory Visit
Admission: RE | Admit: 2015-01-28 | Discharge: 2015-01-28 | Disposition: A | Discharge status: Home / Self Care | Payer: BC Managed Care – PPO | Source: Ambulatory Visit | Attending: Orthopedic Surgery | Admitting: Orthopedic Surgery

## 2015-01-28 DIAGNOSIS — M545 Low back pain, unspecified: Secondary | ICD-10-CM

## 2015-01-28 MED ORDER — IOHEXOL 180 MG/ML  SOLN
1.0000 mL | Freq: Once | INTRAMUSCULAR | Status: DC | PRN
  Administered 2015-01-28: 1 mL via INTRA_ARTICULAR

## 2015-01-28 MED ORDER — METHYLPREDNISOLONE ACETATE 40 MG/ML INJ SUSP (RADIOLOG
120.0000 mg | Freq: Once | INTRAMUSCULAR | Status: AC
  Administered 2015-01-28: 120 mg via INTRA_ARTICULAR

## 2015-03-29 IMAGING — CR DG KNEE COMPLETE 4+V*L*
4 series · 4 of 4 positions shown · non-contrast
Comparison: None available at time of study interpretation.

CLINICAL DATA: Left knee pain.

LEFT KNEE - COMPLETE 4+ VIEW

[t knee ap left]
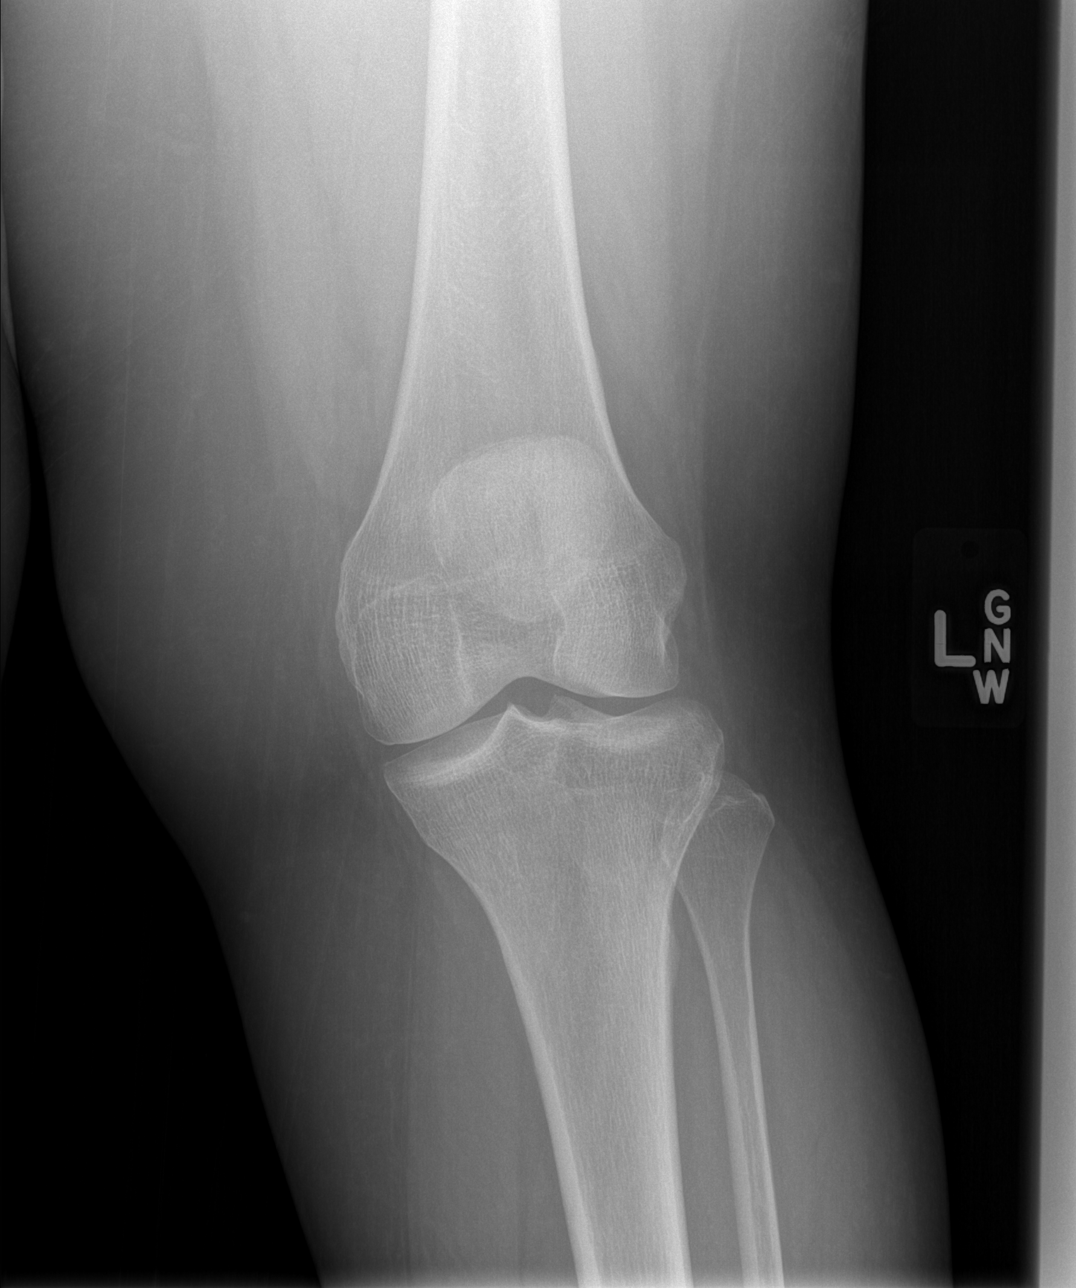

[t knee oblique left (1 of 2)]
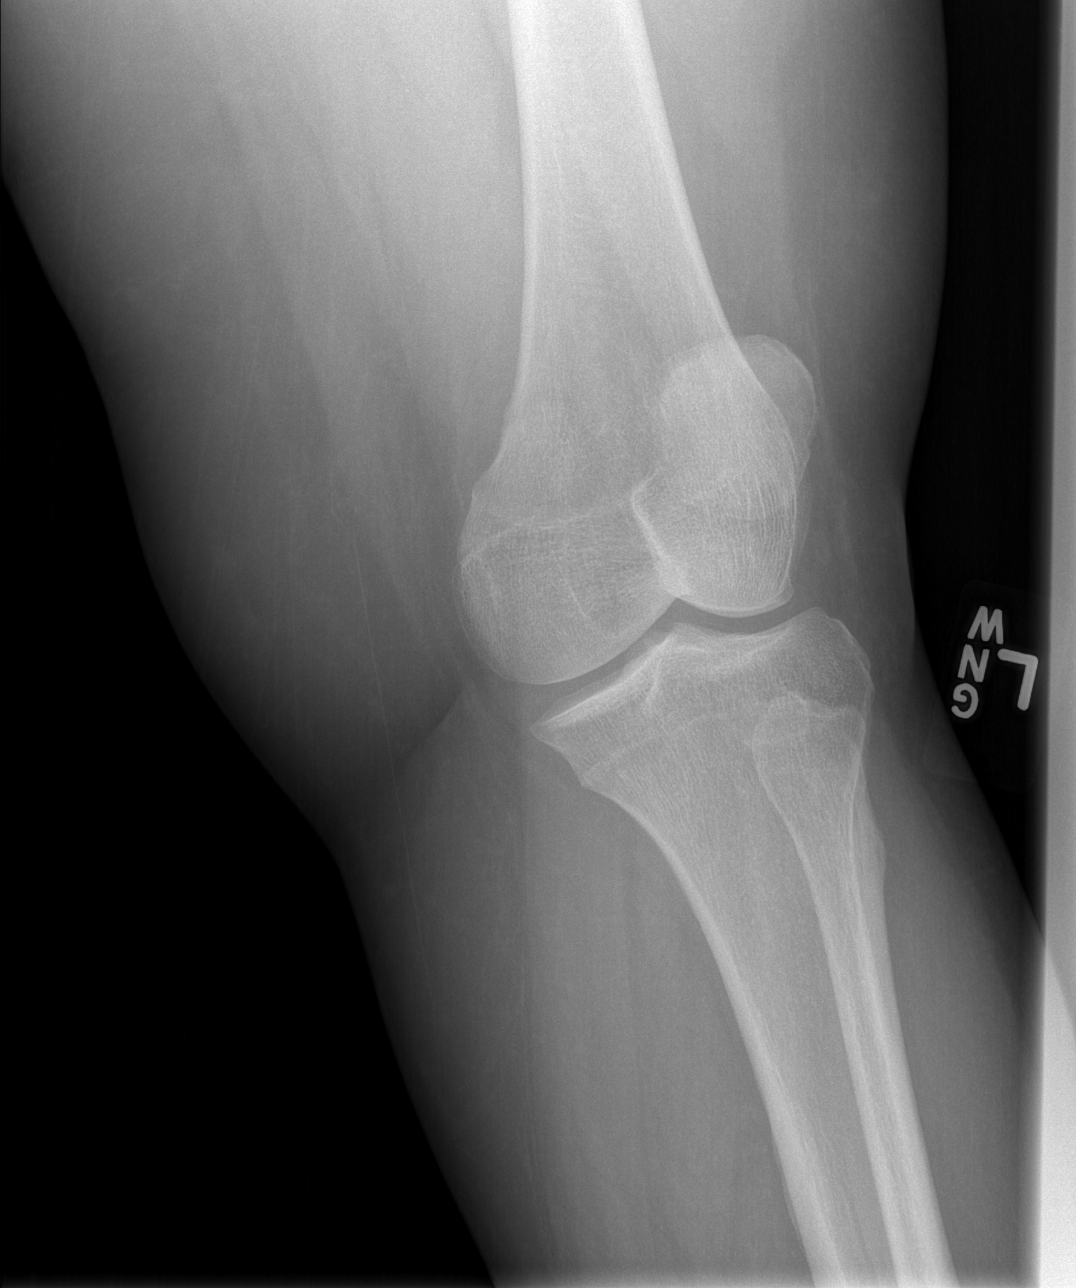

[t knee oblique left (2 of 2)]
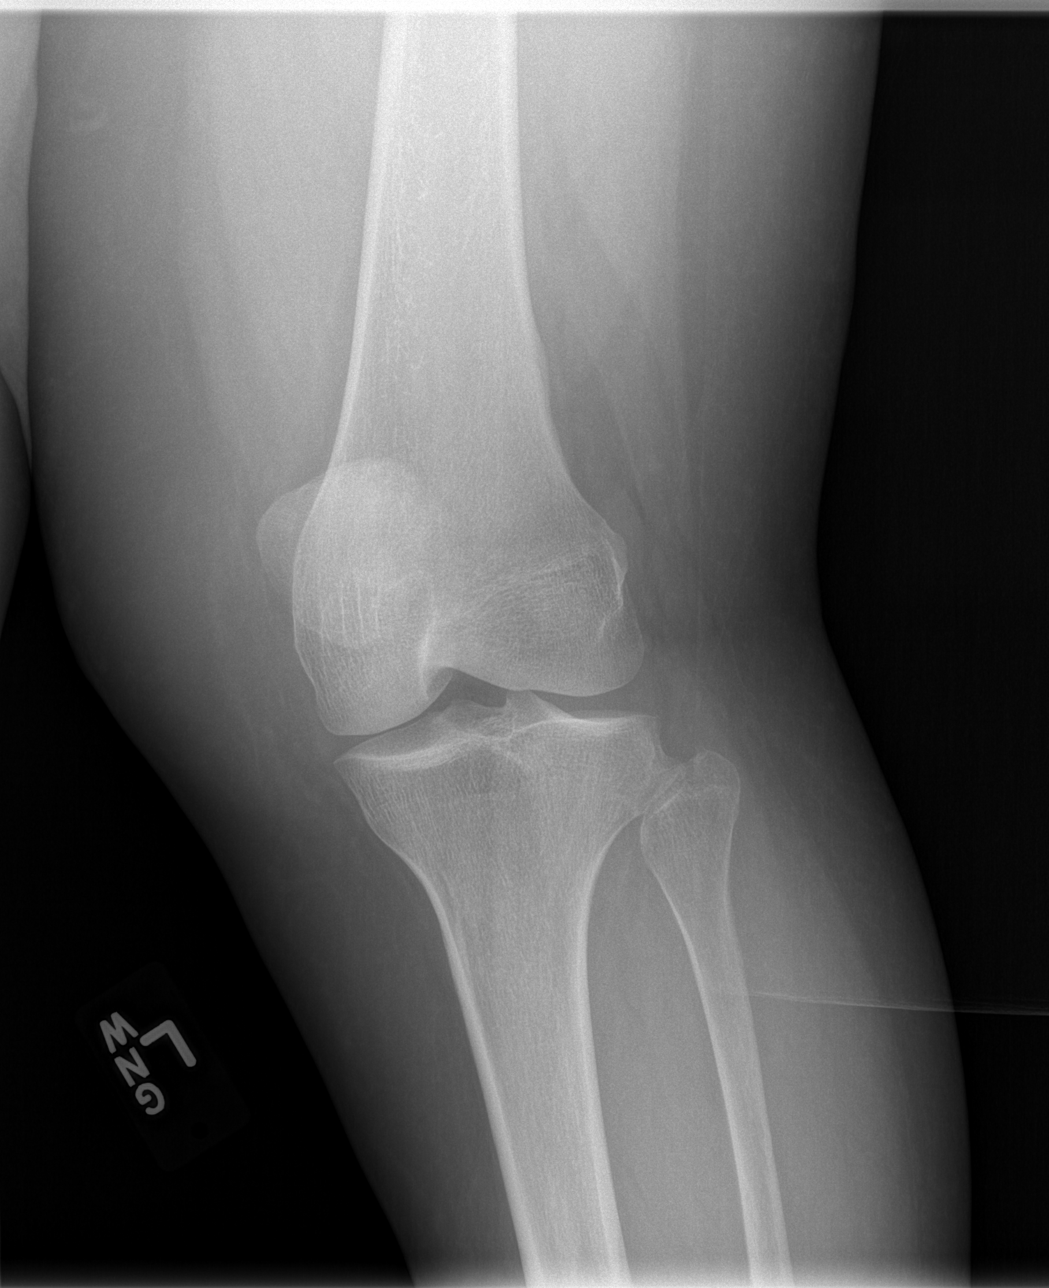

[t knee lat left]
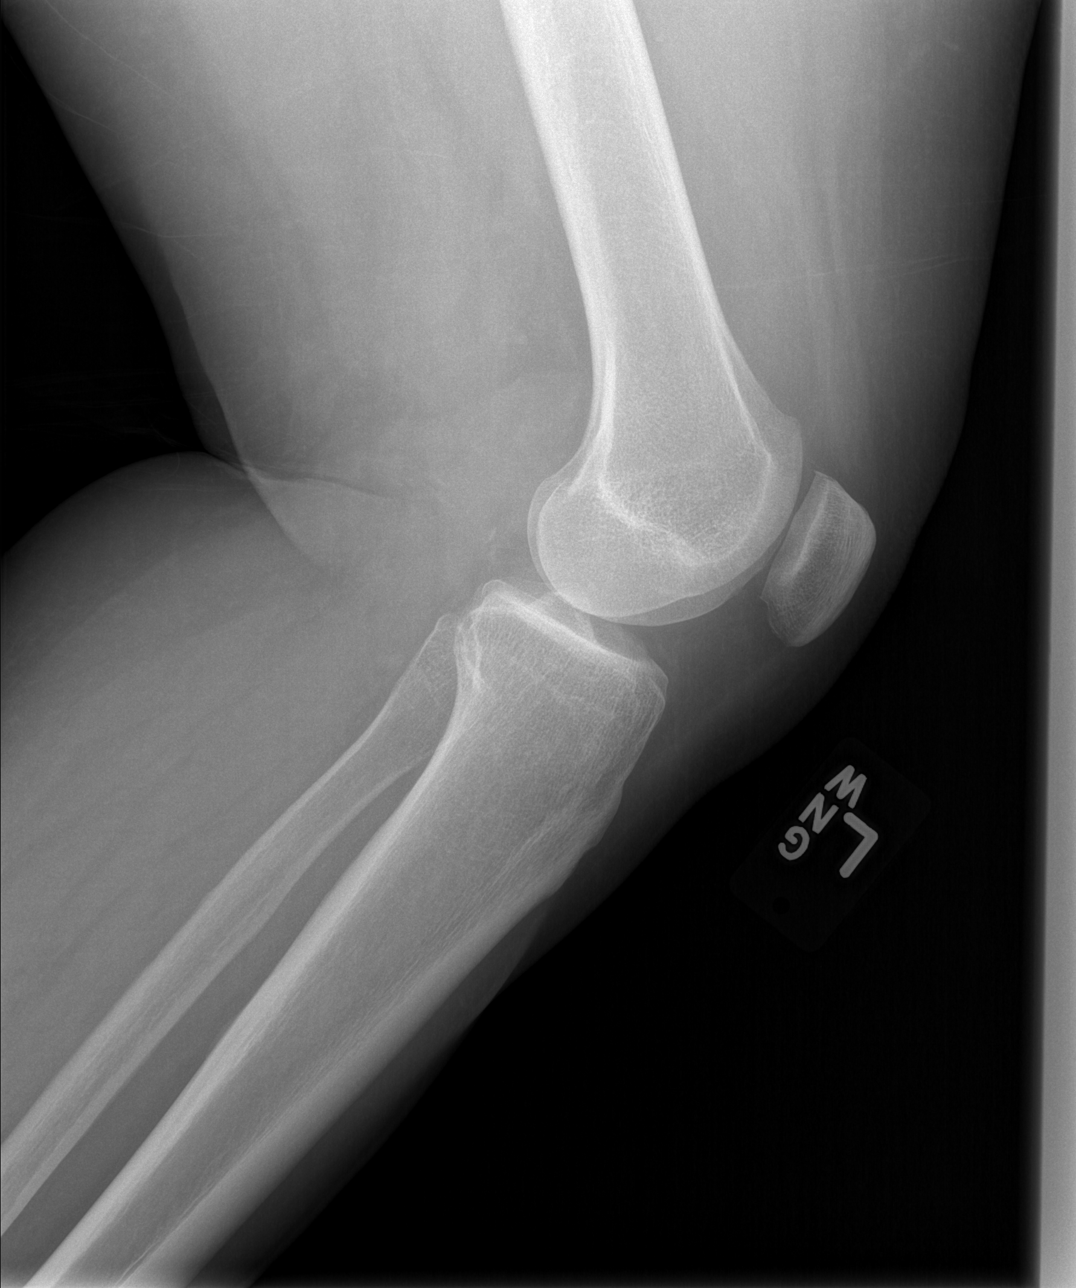

[4 of 4 positions shown; findings below may reference images not displayed]

FINDINGS: No acute fracture deformity or dislocation.  Joint space
intact without erosions.  No destructive bony lesions.  Soft tissue
planes are not suspicious.
IMPRESSION: No acute fracture deformity, dislocation or advanced degenerative
change for age.

## 2015-04-06 ENCOUNTER — Ambulatory Visit (INDEPENDENT_AMBULATORY_CARE_PROVIDER_SITE_OTHER): Payer: Self-pay | Admitting: Internal Medicine

## 2015-04-06 ENCOUNTER — Encounter: Payer: Self-pay | Admitting: Internal Medicine

## 2015-04-06 VITALS — BP 140/74 | HR 94 | Temp 98.1°F | Ht 61.5 in | Wt 229.3 lb

## 2015-04-06 DIAGNOSIS — R21 Rash and other nonspecific skin eruption: Secondary | ICD-10-CM

## 2015-04-06 DIAGNOSIS — R8290 Unspecified abnormal findings in urine: Secondary | ICD-10-CM | POA: Insufficient documentation

## 2015-04-06 DIAGNOSIS — L309 Dermatitis, unspecified: Secondary | ICD-10-CM

## 2015-04-06 DIAGNOSIS — R8299 Other abnormal findings in urine: Secondary | ICD-10-CM

## 2015-04-06 DIAGNOSIS — R300 Dysuria: Secondary | ICD-10-CM

## 2015-04-06 HISTORY — DX: Dermatitis, unspecified: L30.9

## 2015-04-06 LAB — POCT URINALYSIS DIPSTICK
Bilirubin, UA: NEGATIVE
Blood, UA: NEGATIVE
GLUCOSE UA: NEGATIVE
Nitrite, UA: NEGATIVE
PH UA: 7
Protein, UA: NEGATIVE
Urobilinogen, UA: 1

## 2015-04-06 MED ORDER — DIPHENHYDRAMINE HCL 25 MG PO CAPS: 25.0000 mg | ORAL_CAPSULE | ORAL | Status: DC | PRN

## 2015-04-06 MED ORDER — PERMETHRIN 5 % EX CREA: TOPICAL_CREAM | CUTANEOUS | Status: DC

## 2015-04-06 NOTE — Assessment & Plan Note (Signed)
The distrubition of itchy papules on her wrists, in between her fingers, bilateral axilla, nipples, inguinal folds, and gluteal cleft, along with the fact that her husband has had a similar rash, and they both volunteer to a homeless shelter, makes me suspicious she has scabies. I didn't see any classic interdigital burrows, and I did not see any mites on a superficial skin scraping. Regardless, I've recommended she treat herself and her husband from the neck down with topical permethrin 5% cream, and wash any sheets use in the last 3 days. She undoubtedly has underlying xerosis as well, so I recommend she continue using the camphor.

## 2015-04-06 NOTE — Patient Instructions (Signed)
Ms. Salvetti,  It was a pleasure to meet you today!  I think your itchy rash is most likely from dry skin, but you and your husband may also may have a scabies infection. I've given you a cream to put on your entire body from the neck down overnight. Your husband can also do the same, so you don't reinfect one another. Also, be sure to wash all of your linens in hot water. If you're still itching after 2 weeks, give [place] a call and come back. I think you'll start feeling much better soon.  Regarding the odor in your urine, I think this is from dehydration and taking the multivitamin pills. We checked your urine for infection, and there is no infection today. If you start feeling burning when you pee, or pain in her lower belly, give [place] a call back.  Take care, and have a Merry Christmas, Dr. Melburn Hake
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
## 2015-04-06 NOTE — Assessment & Plan Note (Addendum)
She also complains of malodorous and dark urine for the last 2 weeks, in the setting of not drinking much water and starting a multivitamin. I checked a urine dipstick today that didn't show any signs of a urinary tract infection. She also denies any vaginal discharge; the odor is strictly coming from the urine. Moreover, I believe she's just dehydrated and the multivitamin is making her urine an unusually bright color. I recommended she drink more water, but to give [place] a call if she starts experiencing dysuria, urgency, or frequency.

## 2015-04-07 NOTE — Progress Notes (Signed)
I saw and evaluated the patient.  I personally confirmed the key portions of Dr. Flores' history and exam and reviewed pertinent patient test results.  The assessment, diagnosis, and plan were formulated together and I agree with the documentation in the resident's note. 

## 2015-04-13 ENCOUNTER — Encounter: Payer: Self-pay | Admitting: Internal Medicine

## 2015-04-13 ENCOUNTER — Ambulatory Visit (INDEPENDENT_AMBULATORY_CARE_PROVIDER_SITE_OTHER): Payer: Self-pay | Admitting: Internal Medicine

## 2015-04-13 VITALS — BP 148/95 | HR 80 | Temp 97.8°F | Wt 229.7 lb

## 2015-04-13 DIAGNOSIS — I10 Essential (primary) hypertension: Secondary | ICD-10-CM

## 2015-04-13 DIAGNOSIS — R21 Rash and other nonspecific skin eruption: Secondary | ICD-10-CM

## 2015-04-13 MED ORDER — METHYLPREDNISOLONE SODIUM SUCC 40 MG IJ SOLR
40.0000 mg | Freq: Once | INTRAMUSCULAR | Status: AC
  Administered 2015-04-13: 40 mg via INTRAMUSCULAR

## 2015-04-13 NOTE — Patient Instructions (Signed)
Thank you for your visit today.   Please return to the internal medicine clinic as needed.  Please do not use a brush on your skin.  This will irritate your skin and make your itching worse.  You should use only your hands to wash your skin.   Also try to use a lotion such as eucerin, cetaphil, or aquaphor.  You may purchase these at your drug store.   We will give you a steroid injection today to hopefully help your symptoms. Please see the information below on ezcema.    Please be sure to bring all of your medications with you to every visit; this includes herbal supplements, vitamins, eye drops, and any over-the-counter medications.   Should you have any questions regarding your medications and/or any new or worsening symptoms, please be sure to call the clinic at [phone].   If you believe that you are suffering from a life threatening condition or one that may result in the loss of limb or function, then you should call 911 and proceed to the nearest Emergency Department.  Eczema Eczema, also called atopic dermatitis, is a skin disorder that causes inflammation of the skin. It causes a red rash and dry, scaly skin. The skin becomes very itchy. Eczema is generally worse during the cooler winter months and often improves with the warmth of summer. Eczema usually starts showing signs in infancy. Some children outgrow eczema, but it may last through adulthood.  CAUSES  The exact cause of eczema is not known, but it appears to run in families. People with eczema often have a family history of eczema, allergies, asthma, or hay fever. Eczema is not contagious. Flare-ups of the condition may be caused by:   Contact with something you are sensitive or allergic to.   Stress. SIGNS AND SYMPTOMS  Dry, scaly skin.   Red, itchy rash.   Itchiness. This may occur before the skin rash and may be very intense.  DIAGNOSIS  The diagnosis of eczema is usually made based on symptoms and  medical history. TREATMENT  Eczema cannot be cured, but symptoms usually can be controlled with treatment and other strategies. A treatment plan might include:  Controlling the itching and scratching.   Use over-the-counter antihistamines as directed for itching. This is especially useful at night when the itching tends to be worse.   Use over-the-counter steroid creams as directed for itching.   Avoid scratching. Scratching makes the rash and itching worse. It may also result in a skin infection (impetigo) due to a break in the skin caused by scratching.   Keeping the skin well moisturized with creams every day. This will seal in moisture and help prevent dryness. Lotions that contain alcohol and water should be avoided because they can dry the skin.   Limiting exposure to things that you are sensitive or allergic to (allergens).   Recognizing situations that cause stress.   Developing a plan to manage stress.  HOME CARE INSTRUCTIONS   Only take over-the-counter or prescription medicines as directed by your health care provider.   Do not use anything on the skin without checking with your health care provider.   Keep baths or showers short (5 minutes) in warm (not hot) water. Use mild cleansers for bathing. These should be unscented. You may add nonperfumed bath oil to the bath water. It is best to avoid soap and bubble bath.   Immediately after a bath or shower, when the skin is still damp, apply a  moisturizing ointment to the entire body. This ointment should be a petroleum ointment. This will seal in moisture and help prevent dryness. The thicker the ointment, the better. These should be unscented.   Keep fingernails cut short. Children with eczema may need to wear soft gloves or mittens at night after applying an ointment.   Dress in clothes made of cotton or cotton blends. Dress lightly, because heat increases itching.   A child with eczema should stay away from  anyone with fever blisters or cold sores. The virus that causes fever blisters (herpes simplex) can cause a serious skin infection in children with eczema. SEEK MEDICAL CARE IF:   Your itching interferes with sleep.   Your rash gets worse or is not better within 1 week after starting treatment.   You see pus or soft yellow scabs in the rash area.   You have a fever.   You have a rash flare-up after contact with someone who has fever blisters.    This information is not intended to replace advice given to you by your health care provider. Make sure you discuss any questions you have with your health care provider.   Document Released: 03/30/2000 Document Revised: 01/21/2013 Document Reviewed: 11/03/2012 Elsevier Interactive Patient Education [employer].

## 2015-04-13 NOTE — Assessment & Plan Note (Addendum)
Ms. Amsbaugh continues to have a pruritic rash covering her extremities bilaterally and parts of her trunk.  She reports using the permethrin cream as directed.  Also reports scrubbing with a brush.  I explained that this was too harsh to use on her skin and would likely worsen her symptoms.  She also has been using the steroid cream and taking benadryl as needed for the itching.  She states she is not getting much relief. She has been using jergens lotion for her xerosis.  She has the rash on her arms and legs bilaterally.   -advised regular care for eczema -advised to use eucerin or aquaphor instead of jergens which is scented -will give solumedrol 40mg  IM in today given continuation of symptoms without relief -cont steroid cream and use eucerin/aquaphor after showers daily  -advised not to use a brush on her skin but to use her hands only  -would also advised using plain vaseline at her next OV  -cont benadryl but may also use claritin since it is less sedating
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
## 2015-04-13 NOTE — Progress Notes (Signed)
Internal Medicine Clinic Attending  Case discussed with Dr. Gill soon after the resident saw the patient.  We reviewed the resident's history and exam and pertinent patient test results.  I agree with the assessment, diagnosis, and plan of care documented in the resident's note.  

## 2015-04-13 NOTE — Assessment & Plan Note (Addendum)
-  cont current meds -may need to increase HCTZ to 25mg  qd if BP still elevated at next OV

## 2015-05-27 ENCOUNTER — Encounter: Payer: Self-pay | Admitting: Internal Medicine

## 2015-05-27 ENCOUNTER — Ambulatory Visit (INDEPENDENT_AMBULATORY_CARE_PROVIDER_SITE_OTHER): Payer: Self-pay | Admitting: Internal Medicine

## 2015-05-27 VITALS — BP 146/73 | HR 90 | Temp 97.8°F | Ht 62.0 in | Wt 229.2 lb

## 2015-05-27 DIAGNOSIS — L309 Dermatitis, unspecified: Secondary | ICD-10-CM

## 2015-05-27 DIAGNOSIS — M545 Low back pain, unspecified: Secondary | ICD-10-CM

## 2015-05-27 DIAGNOSIS — I10 Essential (primary) hypertension: Secondary | ICD-10-CM

## 2015-05-27 MED ORDER — IBUPROFEN 600 MG PO TABS: 600.0000 mg | ORAL_TABLET | Freq: Three times a day (TID) | ORAL | Status: DC | PRN

## 2015-05-27 MED ORDER — HYDROCHLOROTHIAZIDE 12.5 MG PO TABS: 12.5000 mg | ORAL_TABLET | Freq: Every morning | ORAL | Status: DC

## 2015-05-27 NOTE — Assessment & Plan Note (Signed)
A/P: Left inframammary fold with 2-3 mm hyperpigmented papule without erythema or fluctuance, as well as 3-4 mm erythematous papule without flunctuance.  No drainage or overlying punctum. No vesicles.  Etiology of these papules is unclear.  Morphologically they appear more c/w ingrown hair or furuncle.  However, patient adamantly denies pain and only endorses itch. Itch isolated to the papules is also inconsistent with allergic contact dermatitis.  Patient has a Paediatric nurse with whom she should follow up for either continued observation, biopsy, or lower potency steroid that can be applied under the breast. - CTM - F/u Dermatology - Apply Vaseline, but not apply steroid creams.

## 2015-05-27 NOTE — Patient Instructions (Signed)
1. Continue to monitor the bumps and follow up with your Dermatologist.  2. Do NOT apply your steroid creams to the area as it may cause side effects when between skin layers. 3. You may apply Vaseline to the area and other eczema areas.
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
## 2015-05-30 NOTE — Progress Notes (Signed)
Internal Medicine Clinic Attending  I saw and evaluated the patient.  I personally confirmed the key portions of the history and exam documented by Dr. Taylor and I reviewed pertinent patient test results.  The assessment, diagnosis, and plan were formulated together and I agree with the documentation in the resident's note.  

## 2015-11-28 ENCOUNTER — Encounter: Payer: Self-pay | Admitting: Internal Medicine

## 2015-11-28 ENCOUNTER — Ambulatory Visit (INDEPENDENT_AMBULATORY_CARE_PROVIDER_SITE_OTHER): Payer: Self-pay | Admitting: Internal Medicine

## 2015-11-28 VITALS — BP 142/87 | HR 97 | Temp 98.8°F | Wt 227.7 lb

## 2015-11-28 DIAGNOSIS — K5900 Constipation, unspecified: Secondary | ICD-10-CM | POA: Insufficient documentation

## 2015-11-28 DIAGNOSIS — H6690 Otitis media, unspecified, unspecified ear: Secondary | ICD-10-CM | POA: Insufficient documentation

## 2015-11-28 DIAGNOSIS — J069 Acute upper respiratory infection, unspecified: Secondary | ICD-10-CM

## 2015-11-28 MED ORDER — POLYETHYLENE GLYCOL 3350 17 G PO PACK: 17.0000 g | PACK | Freq: Every day | ORAL | 0 refills | Status: DC

## 2015-11-28 NOTE — Patient Instructions (Addendum)
Please take over the counter Coricidin D, 1 tablet every 6 hours as needed, for your cough and cold symptoms. If symptoms persist or do not improve by Friday, please call the office to be seen before your trip on Saturday. For you constipation, please take miralax daily until your bowel movements normalize.  If you have any questions or concerns, please call our office. Thank you!

## 2015-11-28 NOTE — Assessment & Plan Note (Signed)
Presents today with 3 days of nasal congestion, cough, and watery eyes. She endorses subjective fevers at home. She is leaving for a cruise on Saturday and wanted to be seen before her trip. Discussed with patient that symptoms are most likely viral in etiology and should improve over the next few days. No indication for antibiotic treatment at this time. Advised patient to take over the counter Coricidin D (dextromethorphan + chlorpheniramine). If symptoms worsen or do not improve by Friday, told patient to call back for a follow up appointment prior to her trip.

## 2015-11-28 NOTE — Assessment & Plan Note (Signed)
Last normal BM about 1 week ago. Patient has been taking Doculax with minimal relief. Gave prescription for miralax daily.
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
## 2015-12-08 NOTE — Progress Notes (Signed)
Internal Medicine Clinic Attending  I saw and evaluated the patient.  I personally confirmed the key portions of the history and exam documented by Dr. Guilloud and I reviewed pertinent patient test results.  The assessment, diagnosis, and plan were formulated together and I agree with the documentation in the resident's note.  

## 2016-01-27 ENCOUNTER — Ambulatory Visit (INDEPENDENT_AMBULATORY_CARE_PROVIDER_SITE_OTHER): Payer: Self-pay | Admitting: Internal Medicine

## 2016-01-27 VITALS — BP 133/78 | HR 83 | Temp 97.8°F | Ht 61.0 in | Wt 226.7 lb

## 2016-01-27 DIAGNOSIS — B351 Tinea unguium: Secondary | ICD-10-CM | POA: Insufficient documentation

## 2016-01-27 MED ORDER — TERBINAFINE HCL 250 MG PO TABS: 250.0000 mg | ORAL_TABLET | Freq: Every day | ORAL | 0 refills | Status: AC

## 2016-01-27 NOTE — Progress Notes (Signed)
Internal Medicine Clinic Attending  Case discussed with Dr. Patel,Vishal at the time of the visit.  We reviewed the resident's history and exam and pertinent patient test results.  I agree with the assessment, diagnosis, and plan of care documented in the resident's note.  

## 2016-01-27 NOTE — Assessment & Plan Note (Signed)
Patient reports thickened yellow nails on her 1st toes bilaterally. She has not noticed any skin break around her toes, bleeding, or discharge. She gets occasional pedicures and uses toenail polish.  Patient with mild onychomycosis. She has declined podiatry referral citing cost as an issue. She is agreeable to try Terbinafine 12 week oral therapy. Prior LFTs are within normal limits. -Start Terbinafine 250 mg po daily for 12 weeks -Check LFTs in approximately 6 weeks -Continue foot hygiene
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
## 2016-01-27 NOTE — Patient Instructions (Addendum)
It was a pleasure to meet you [name].  It looks like you have a mild fungal infection of your toes. I have sent a prescription for a medication called Terbinafine 250 mg once a day for 12 weeks.  We will need to check blood work in about 6 weeks while you are on this medication to make sure there are no side effects on the liver.  Please follow up with Dr. Charlynn Mason who is your PCP.    Terbinafine tablets What is this medicine? TERBINAFINE (TER bin a feen) is an antifungal medicine. It is used to treat certain kinds of fungal or yeast infections. This medicine may be used for other purposes; ask your health care provider or pharmacist if you have questions. What should I tell my health care provider before I take this medicine? They need to know if you have any of these conditions: -drink alcoholic beverages -kidney disease -liver disease -an unusual or allergic reaction to terbinafine, other medicines, foods, dyes, or preservatives -pregnant or trying to get pregnant -breast-feeding How should I use this medicine? Take this medicine by mouth with a full glass of water. Follow the directions on the prescription label. You can take this medicine with food or on an empty stomach. Take your medicine at regular intervals. Do not take your medicine more often than directed. Do not skip doses or stop your medicine early even if you feel better. Do not stop taking except on your doctor's advice. Talk to your pediatrician regarding the use of this medicine in children. Special care may be needed. Overdosage: If you think you have taken too much of this medicine contact a poison control center or emergency room at once. NOTE: This medicine is only for you. Do not share this medicine with others. What if I miss a dose? If you miss a dose, take it as soon as you can. If it is almost time for your next dose, take only that dose. Do not take double or extra doses. What may interact with this  medicine? Do not take this medicine with any of the following medications: -thioridazine This medicine may also interact with the following medications: -beta-blockers -caffeine -cimetidine -cyclosporine -medicines for depression, anxiety, or psychotic disturbances -medicines for fungal infections like fluconazole and ketoconazole -medicines for irregular heartbeat like amiodarone, flecainide and propafenone -rifampin -warfarin This list may not describe all possible interactions. Give your health care provider a list of all the medicines, herbs, non-prescription drugs, or dietary supplements you use. Also tell them if you smoke, drink alcohol, or use illegal drugs. Some items may interact with your medicine. What should I watch for while using this medicine? Visit your doctor or health care provider regularly. Tell your doctor right away if you have nausea or vomiting, loss of appetite, stomach pain on your right upper side, yellow skin, dark urine, light stools, or are over tired. Some fungal infections need many weeks or months of treatment to cure. If you are taking this medicine for a long time, you will need to have important blood work done. What side effects may I notice from receiving this medicine? Side effects that you should report to your doctor or health care professional as soon as possible: -allergic reactions like skin rash or hives, swelling of the face, lips, or tongue -changes in vision -dark urine -fever or infection -general ill feeling or flu-like symptoms -light-colored stools -loss of appetite, nausea -redness, blistering, peeling or loosening of the skin, including inside the  mouth -right upper belly pain -unusually weak or tired -yellowing of the eyes or skin Side effects that usually do not require medical attention (report to your doctor or health care professional if they continue or are bothersome): -changes in taste -diarrhea -hair loss -muscle or  joint pain -stomach gas -stomach upset This list may not describe all possible side effects. Call your doctor for medical advice about side effects. You may report side effects to FDA at 1-800-FDA-1088. Where should I keep my medicine? Keep out of the reach of children. Store at room temperature below 25 degrees C (77 degrees F). Protect from light. Throw away any unused medicine after the expiration date. NOTE: This sheet is a summary. It may not cover all possible information. If you have questions about this medicine, talk to your doctor, pharmacist, or health care provider.    2016, Elsevier/Gold Standard. (2007-06-13 16:28:07)

## 2016-01-31 ENCOUNTER — Telehealth: Payer: Self-pay | Admitting: Internal Medicine

## 2016-01-31 NOTE — Telephone Encounter (Signed)
A. REMINDER CALL, LMTCB °

## 2016-02-01 ENCOUNTER — Ambulatory Visit: Payer: Self-pay

## 2016-02-07 ENCOUNTER — Telehealth: Payer: Self-pay | Admitting: Internal Medicine

## 2016-02-07 NOTE — Telephone Encounter (Signed)
APT. REMINDER CALL, LMTCB °

## 2016-02-08 ENCOUNTER — Ambulatory Visit: Payer: Self-pay

## 2016-02-23 MED FILL — TERBINAFINE HCL 250 MG TAB: 250 | 28 days supply | Qty: 28 | Fill #0

## 2016-04-04 ENCOUNTER — Other Ambulatory Visit: Payer: Self-pay | Admitting: Internal Medicine

## 2016-04-04 ENCOUNTER — Other Ambulatory Visit: Payer: Self-pay

## 2016-04-04 DIAGNOSIS — K219 Gastro-esophageal reflux disease without esophagitis: Secondary | ICD-10-CM

## 2016-04-04 DIAGNOSIS — I10 Essential (primary) hypertension: Secondary | ICD-10-CM

## 2016-04-04 DIAGNOSIS — R21 Rash and other nonspecific skin eruption: Secondary | ICD-10-CM

## 2016-04-04 NOTE — Telephone Encounter (Signed)
Pt requesting a refill on   hydrochlorothiazide (HYDRODIURIL) 12.5 MG tablet Take 1 tablet (12.5 mg total) by mouth every morning., Starting 05/27/2015, Until Discontinued, Normal Last Dose: Not Recorded   ibuprofen (ADVIL,MOTRIN) 600 MG tablet  omeprazole (PRILOSEC) 20 MG capsule

## 2016-04-05 MED ORDER — HYDROCHLOROTHIAZIDE 12.5 MG PO TABS: 12.5000 mg | ORAL_TABLET | Freq: Every morning | ORAL | 3 refills | Status: DC

## 2016-04-05 MED ORDER — DIPHENHYDRAMINE HCL 25 MG PO CAPS: 25.0000 mg | ORAL_CAPSULE | ORAL | 2 refills | Status: DC | PRN

## 2016-04-05 MED ORDER — OMEPRAZOLE 20 MG PO CPDR: 20.0000 mg | DELAYED_RELEASE_CAPSULE | Freq: Every day | ORAL | 3 refills | Status: DC

## 2016-04-05 MED ORDER — IBUPROFEN 600 MG PO TABS: 600.0000 mg | ORAL_TABLET | Freq: Three times a day (TID) | ORAL | 1 refills | Status: DC | PRN

## 2016-04-05 MED FILL — OMEPRAZOLE DR 20 MG CAPSULE: 20 | 30 days supply | Qty: 30 | Fill #0

## 2016-04-05 MED FILL — HYDROCHLOROTHIAZIDE 12.5 MG: 12.5 | 30 days supply | Qty: 30 | Fill #0

## 2016-04-05 MED FILL — IBUPROFEN 600 MG TABLET: 600 | 30 days supply | Qty: 90 | Fill #0

## 2016-04-05 NOTE — Telephone Encounter (Signed)
Duplicate request-refill request has been sent to pcp.    Refill status: pending review. Danielle Mason, Marvel Sapp Cassady12/21/20178:53 AM

## 2016-04-11 ENCOUNTER — Ambulatory Visit (INDEPENDENT_AMBULATORY_CARE_PROVIDER_SITE_OTHER): Payer: Self-pay | Admitting: Pulmonary Disease

## 2016-04-11 DIAGNOSIS — R05 Cough: Secondary | ICD-10-CM

## 2016-04-11 DIAGNOSIS — I10 Essential (primary) hypertension: Secondary | ICD-10-CM

## 2016-04-11 DIAGNOSIS — H66002 Acute suppurative otitis media without spontaneous rupture of ear drum, left ear: Secondary | ICD-10-CM

## 2016-04-11 DIAGNOSIS — J029 Acute pharyngitis, unspecified: Secondary | ICD-10-CM

## 2016-04-11 DIAGNOSIS — Z79899 Other long term (current) drug therapy: Secondary | ICD-10-CM

## 2016-04-11 DIAGNOSIS — H65192 Other acute nonsuppurative otitis media, left ear: Secondary | ICD-10-CM

## 2016-04-11 MED ORDER — HYDROCHLOROTHIAZIDE 25 MG PO TABS: 25.0000 mg | ORAL_TABLET | Freq: Every morning | ORAL | 1 refills | Status: DC

## 2016-04-11 MED ORDER — AZITHROMYCIN 250 MG PO TABS: ORAL_TABLET | ORAL | 0 refills | Status: AC

## 2016-04-11 MED FILL — AZITHROMYCIN 250 MG TABLET: 250 | 5 days supply | Qty: 6 | Fill #0

## 2016-04-11 NOTE — Patient Instructions (Addendum)
Take your antibiotic (azithromycin). Take two tablets today. Then starting tomorrow take one tablet a day until you complete it.  You may take two tablets (25mg  total) of your hydrochlorothiazide daily until you run out of your current prescription.  Follow up in 3 to 4 weeks   Otitis Media, Adult Otitis media is redness, soreness, and puffiness (swelling) in the space just behind your eardrum (middle ear). It may be caused by allergies or infection. It often happens along with a cold. Follow these instructions at home:  Take your medicine as told. Finish it even if you start to feel better.  Only take over-the-counter or prescription medicines for pain, discomfort, or fever as told by your doctor.  Follow up with your doctor as told. Contact a doctor if:  You have otitis media only in one ear, or bleeding from your nose, or both.  You notice a lump on your neck.  You are not getting better in 3-5 days.  You feel worse instead of better. Get help right away if:  You have pain that is not helped with medicine.  You have puffiness, redness, or pain around your ear.  You get a stiff neck.  You cannot move part of your face (paralysis).  You notice that the bone behind your ear hurts when you touch it. This information is not intended to replace advice given to you by your health care provider. Make sure you discuss any questions you have with your health care provider. Document Released: 09/19/2007 Document Revised: 09/08/2015 Document Reviewed: 10/28/2012 Elsevier Interactive Patient Education  2017 [employer].
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
## 2016-04-11 NOTE — Assessment & Plan Note (Signed)
Assessment: She has symptoms of viral upper respiratory infection including cough, chills. Ear pain worse on left than right. Her tympanic membrane appears erythematous more on left than right. She also has an effusion.  Plan: Azithromycin 500mg  then 250mg  daily for 5 days of antibiotics Return precautions discussed

## 2016-04-11 NOTE — Assessment & Plan Note (Signed)
Assessment: BP 164/92, uncontrolled. She thinks that her pharmacy was filling the 25mg  tablet and just noticed that she was given the 12.5mg  tablets recently from a different pharmacy.  Plan: Take 2 tablets daily (25mg  total) until current bottle complete. New Rx sent for 25mg  daily BMP today as she has not had one since 2015

## 2016-04-12 ENCOUNTER — Encounter (HOSPITAL_COMMUNITY): Payer: Self-pay | Admitting: Emergency Medicine

## 2016-04-12 ENCOUNTER — Emergency Department (HOSPITAL_COMMUNITY)
Admission: EM | Admit: 2016-04-12 | Discharge: 2016-04-13 | Disposition: A | Discharge status: Home / Self Care | Payer: Self-pay | Attending: Emergency Medicine | Admitting: Emergency Medicine

## 2016-04-12 DIAGNOSIS — Z91040 Latex allergy status: Secondary | ICD-10-CM | POA: Insufficient documentation

## 2016-04-12 DIAGNOSIS — R059 Cough, unspecified: Secondary | ICD-10-CM

## 2016-04-12 DIAGNOSIS — R05 Cough: Secondary | ICD-10-CM

## 2016-04-12 DIAGNOSIS — Z7982 Long term (current) use of aspirin: Secondary | ICD-10-CM | POA: Insufficient documentation

## 2016-04-12 DIAGNOSIS — Z79899 Other long term (current) drug therapy: Secondary | ICD-10-CM | POA: Insufficient documentation

## 2016-04-12 DIAGNOSIS — J069 Acute upper respiratory infection, unspecified: Secondary | ICD-10-CM | POA: Insufficient documentation

## 2016-04-12 DIAGNOSIS — I10 Essential (primary) hypertension: Secondary | ICD-10-CM | POA: Insufficient documentation

## 2016-04-12 LAB — BMP8+ANION GAP
Anion Gap: 21 mmol/L — ABNORMAL HIGH (ref 10.0–18.0)
BUN / CREAT RATIO: 15 (ref 9–23)
BUN: 10 mg/dL (ref 6–24)
CHLORIDE: 103 mmol/L (ref 96–106)
CO2: 18 mmol/L (ref 18–29)
Calcium: 9.1 mg/dL (ref 8.7–10.2)
Creatinine, Ser: 0.67 mg/dL (ref 0.57–1.00)
GFR calc Af Amer: 113 mL/min/{1.73_m2} (ref >59)
GFR calc non Af Amer: 98 mL/min/{1.73_m2} (ref >59)
GLUCOSE: 125 mg/dL — AB (ref 65–99)
POTASSIUM: 4.4 mmol/L (ref 3.5–5.2)
SODIUM: 142 mmol/L (ref 134–144)

## 2016-04-12 NOTE — ED Triage Notes (Signed)
Patient reports sore throat today and persistent bilateral ear ache onset last week . Denies fever or chills.

## 2016-04-13 ENCOUNTER — Emergency Department (HOSPITAL_COMMUNITY): Payer: Self-pay

## 2016-04-13 LAB — RAPID STREP SCREEN (MED CTR MEBANE ONLY): Streptococcus, Group A Screen (Direct): NEGATIVE

## 2016-04-13 MED ORDER — BENZONATATE 100 MG PO CAPS: 100.0000 mg | ORAL_CAPSULE | Freq: Three times a day (TID) | ORAL | 0 refills | Status: DC

## 2016-04-13 MED FILL — BENZONATATE 100 MG CAPSULE: 100 | 7 days supply | Qty: 21 | Fill #0

## 2016-04-13 NOTE — ED Notes (Signed)
Pt departed in NAD.  
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
## 2016-04-15 LAB — CULTURE, GROUP A STREP (THRC)

## 2016-04-18 ENCOUNTER — Ambulatory Visit (INDEPENDENT_AMBULATORY_CARE_PROVIDER_SITE_OTHER): Payer: Self-pay | Admitting: Internal Medicine

## 2016-04-18 DIAGNOSIS — B9789 Other viral agents as the cause of diseases classified elsewhere: Secondary | ICD-10-CM

## 2016-04-18 DIAGNOSIS — J069 Acute upper respiratory infection, unspecified: Secondary | ICD-10-CM | POA: Insufficient documentation

## 2016-04-18 HISTORY — DX: Other viral agents as the cause of diseases classified elsewhere: B97.89

## 2016-04-18 MED ORDER — DM-GUAIFENESIN ER 30-600 MG PO TB12: 1.0000 | ORAL_TABLET | Freq: Two times a day (BID) | ORAL | 0 refills | Status: DC

## 2016-04-18 NOTE — Patient Instructions (Signed)
Danielle Mason,  You can try throat lozenges over the counter, numbing spray for your sore throat. The antibiotics will stay in your system for the next 2 weeks and continue to work.   I am going to send in you a prescription for Mucinex DM to help with your cough and congestion.   I would like to see you back in 2 weeks for follow up.   Pharyngitis Pharyngitis is a sore throat (pharynx). There is redness, pain, and swelling of your throat. Follow these instructions at home:  Drink enough fluids to keep your pee (urine) clear or pale yellow.  Only take medicine as told by your doctor.  You may get sick again if you do not take medicine as told. Finish your medicines, even if you start to feel better.  Do not take aspirin.  Rest.  Rinse your mouth (gargle) with salt water ( tsp of salt per 1 qt of water) every 1-2 hours. This will help the pain.  If you are not at risk for choking, you can suck on hard candy or sore throat lozenges. Contact a doctor if:  You have large, tender lumps on your neck.  You have a rash.  You cough up green, yellow-brown, or bloody spit. Get help right away if:  You have a stiff neck.  You drool or cannot swallow liquids.  You throw up (vomit) or are not able to keep medicine or liquids down.  You have very bad pain that does not go away with medicine.  You have problems breathing (not from a stuffy nose). This information is not intended to replace advice given to you by your health care provider. Make sure you discuss any questions you have with your health care provider. Document Released: 09/19/2007 Document Revised: 09/08/2015 Document Reviewed: 12/08/2012 Elsevier Interactive Patient Education  2017 [employer].

## 2016-04-18 NOTE — Assessment & Plan Note (Signed)
She was seen in clinic on 12/27 with complaints of sore throat, productive cough and left ear pain. At that time she was felt to have viral URI symptoms but noted to have erythematous left tympanic membrane with an effusion. 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 She presented to the ED the following day with continued complaints of sore throat and ear pain. Reports to me today she went because she did not feel any better. In the ED, she had a clear CXR with a negative rapid strep and since has a negative strep A culture.  Today, she presents with complaints of continued sore throat and left ear pain. She reports that she has muffled sound in her left ear as well as pain. Denies any fevers or chills. Does report finishing the Z-pak. Has continued cough with mucus production that was initially yellow/brown and has now become clear. No nausea/vomiting. No sinus congestion or rhinorrhea.   Assessment No concern for strep phalangitis at this time and had normal CXR with clear lung exam. Left ear symptomatically not improved but appears to being improving on physical exam based on previous notes. 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   Plan Will treat symptomatically with Robitussin DM and other over the counter measures.  RTC in 2 weeks
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
## 2016-04-19 ENCOUNTER — Ambulatory Visit: Payer: Self-pay

## 2016-04-20 NOTE — Progress Notes (Signed)
Internal Medicine Clinic Attending  Case discussed with Dr. Krall soon after the resident saw the patient.  We reviewed the resident's history and exam and pertinent patient test results.  I agree with the assessment, diagnosis, and plan of care documented in the resident's note. 

## 2016-04-20 NOTE — Progress Notes (Signed)
Internal Medicine Clinic Attending  Case discussed with Dr. Boswell at the time of the visit.  We reviewed the resident's history and exam and pertinent patient test results.  I agree with the assessment, diagnosis, and plan of care documented in the resident's note.  

## 2016-05-01 ENCOUNTER — Ambulatory Visit (INDEPENDENT_AMBULATORY_CARE_PROVIDER_SITE_OTHER): Payer: Self-pay | Admitting: Internal Medicine

## 2016-05-01 DIAGNOSIS — J208 Acute bronchitis due to other specified organisms: Secondary | ICD-10-CM | POA: Insufficient documentation

## 2016-05-01 DIAGNOSIS — B9689 Other specified bacterial agents as the cause of diseases classified elsewhere: Secondary | ICD-10-CM

## 2016-05-01 MED ORDER — DOXYCYCLINE HYCLATE 100 MG PO CAPS: 100.0000 mg | ORAL_CAPSULE | Freq: Two times a day (BID) | ORAL | 0 refills | Status: DC

## 2016-05-01 MED ORDER — HYDROCOD POLST-CPM POLST ER 10-8 MG/5ML PO SUER: 5.0000 mL | Freq: Four times a day (QID) | ORAL | 0 refills | Status: DC | PRN

## 2016-05-01 MED FILL — HYDROCHLOROTHIAZIDE 25 MG T: 25 | 30 days supply | Qty: 30 | Fill #0

## 2016-05-01 MED FILL — DOXYCYCLINE HYCLATE 100 MG: 100 | 5 days supply | Qty: 10 | Fill #0

## 2016-05-01 MED FILL — HYDROCODONE-CHLORPHENIRAM S: 10-8 | 15 days supply | Qty: 115 | Fill #0

## 2016-05-01 NOTE — Progress Notes (Signed)
Case discussed with Dr. Boswell at the time of the visit. We reviewed the resident's history and exam and pertinent patient test results. I agree with the assessment, diagnosis, and plan of care documented in the resident's note. 
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
## 2016-05-01 NOTE — Patient Instructions (Addendum)
Danielle Mason,  I am going to give you a prescription for an antibiotic, doxycyline, for your infection. It is a twice a day medication for the next 5 days.  I am also going to give you a stronger cough medication.  If you do not improve in the next week, please return to clinic.

## 2016-05-01 NOTE — Assessment & Plan Note (Signed)
  [name] was seen in clinic for sore throat and ear pain on 12/27. She completed a course of azithromycin for otitis media however symptoms did not improve very much following the course and thus presented to clinic on 1/3 for follow up. In between visits she was seen in the ED for similar complaints and noted to have a clear CXR with a negative rapid strep and negative strep culture. At her follow up visit on 1/3 she was treated symptomatically for viral URI with robitussin DM and tessalon. Reports no improvement.   Today, she returns to clinic with complaints cough with yellow/green sputum production. Says she felt better for 1-2 days and then got worse again. Cough is worst at night when she lays down and in the mornings. Improves some during the days. No fevers, chills, shortness of breath. Some myalgias and substernal chest pain with coughing and headache. Reports rhinorrhea, sore throat and sinus congestion but no pressure. Reports ear pain has been improving.   A/P Post-viral bacterial bronchitis super infection. Will treat with doxycycline 100 mg bid x 5 days and symptomatically with tussionex.

## 2016-05-02 ENCOUNTER — Ambulatory Visit: Payer: Self-pay

## 2016-06-05 ENCOUNTER — Other Ambulatory Visit: Payer: Self-pay | Admitting: Internal Medicine

## 2016-06-05 DIAGNOSIS — I10 Essential (primary) hypertension: Secondary | ICD-10-CM

## 2016-06-05 MED ORDER — HYDROCHLOROTHIAZIDE 25 MG PO TABS: 25.0000 mg | ORAL_TABLET | Freq: Every morning | ORAL | 0 refills | Status: DC

## 2016-06-05 NOTE — Telephone Encounter (Signed)
Will fill for 90 days. Needs appointment scheduled with me before next refill. Thanks.   HCTZ 25 mg is the correct dosing, not the 12.5 mg.

## 2016-06-06 MED FILL — HYDROCHLOROTHIAZIDE 25 MG T: 25 | 30 days supply | Qty: 30 | Fill #1

## 2016-06-12 ENCOUNTER — Ambulatory Visit (INDEPENDENT_AMBULATORY_CARE_PROVIDER_SITE_OTHER): Payer: Self-pay | Admitting: Internal Medicine

## 2016-06-12 ENCOUNTER — Encounter: Payer: Self-pay | Admitting: Internal Medicine

## 2016-06-12 VITALS — BP 139/72 | HR 75 | Temp 97.4°F | Ht 61.0 in | Wt 230.0 lb

## 2016-06-12 DIAGNOSIS — R7303 Prediabetes: Secondary | ICD-10-CM

## 2016-06-12 DIAGNOSIS — M25561 Pain in right knee: Secondary | ICD-10-CM

## 2016-06-12 DIAGNOSIS — I10 Essential (primary) hypertension: Secondary | ICD-10-CM

## 2016-06-12 DIAGNOSIS — Z9181 History of falling: Secondary | ICD-10-CM

## 2016-06-12 DIAGNOSIS — Z79899 Other long term (current) drug therapy: Secondary | ICD-10-CM

## 2016-06-12 MED ORDER — METHOCARBAMOL 500 MG PO TABS: 500.0000 mg | ORAL_TABLET | Freq: Three times a day (TID) | ORAL | 0 refills | Status: DC | PRN

## 2016-06-12 MED ORDER — DICLOFENAC SODIUM 1 % TD GEL
4.0000 g | Freq: Four times a day (QID) | TRANSDERMAL | 1 refills | Status: DC
Start: 2016-06-12 — End: 2016-10-25

## 2016-06-12 MED ORDER — ACETAMINOPHEN 500 MG PO TABS: 500.0000 mg | ORAL_TABLET | Freq: Four times a day (QID) | ORAL | 0 refills | Status: DC | PRN

## 2016-06-12 MED ORDER — HYDROCHLOROTHIAZIDE 25 MG PO TABS: 25.0000 mg | ORAL_TABLET | Freq: Every morning | ORAL | 2 refills | Status: DC

## 2016-06-12 MED FILL — DICLOFENAC SODIUM 1% GEL: 1 | 6 days supply | Qty: 100 | Fill #0

## 2016-06-12 MED FILL — METHOCARBAMOL 500 MG TABLET: 500 | 7 days supply | Qty: 20 | Fill #0
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
## 2016-06-12 NOTE — Patient Instructions (Signed)
Ms. Negash,  It was a pleasure seeing you today. Your medications should be ready for you at the [facility]patient [facility].

## 2016-06-15 DIAGNOSIS — M25561 Pain in right knee: Secondary | ICD-10-CM | POA: Insufficient documentation

## 2016-06-15 NOTE — Assessment & Plan Note (Signed)
BP Readings from Last 3 Encounters:  06/12/16 139/72  05/01/16 134/85  04/18/16 (!) 155/85    Lab Results  Component Value Date   NA 142 04/11/2016   K 4.4 04/11/2016   CREATININE 0.67 04/11/2016   BP today is 139/72. She is currently on HCTZ 25 mg daily. Denies any side effects. Denies any headaches, vision changes, lightheadedness/dizziness.   Assessment: Stable HTN  Plan: Continue current medications  BMET today > unfortunately patient left without getting labs drawn today. Check at next visit.

## 2016-06-15 NOTE — Assessment & Plan Note (Signed)
Denies any polyuria or polydipsia today. Has not followed up with Danielle Mason. Not interested today.  Assessment: Pre-DM  Plan:  Check A1c > unfortunately patient left before getting labs drawn today. Check at next visit.

## 2016-06-15 NOTE — Assessment & Plan Note (Signed)
Danielle Mason presentes today with right knee pain. She reports that about 2 weeks ago she was at a party playing musical chairs and fell forward, landing on her right knee. Since then she has had pain and swelling in the right knee. Does report that it has been improving. Some relief with NSAIDs. Denies any instability; able to walk up and down stairs. No catching or locking. Knee has not given out on her. No further falls. Does report muscle cramping in her quadriceps that exacerbates the pain.   Minimal tenderness to palpation over patellar and lateral aspect of knee. Normal active and passive range of movement without pain. Knee does have some edema but minimal.  Assessment:: Right knee pain secondary to mechanical fall  Plan: Conservative therapy with ice/heat, Voltaren gael, tylenol prn. Robaxin which has worked in the past for her for muscle cramps.

## 2016-06-15 NOTE — Progress Notes (Signed)
Internal Medicine Clinic Attending  Case discussed with Dr. Boswell at the time of the visit.  We reviewed the resident's history and exam and pertinent patient test results.  I agree with the assessment, diagnosis, and plan of care documented in the resident's note.  

## 2016-08-01 ENCOUNTER — Encounter: Payer: Self-pay | Admitting: Internal Medicine

## 2016-08-09 MED FILL — HYDROCHLOROTHIAZIDE 25 MG T: 25 | 30 days supply | Qty: 30 | Fill #0

## 2016-09-05 ENCOUNTER — Encounter: Payer: Self-pay | Admitting: Internal Medicine

## 2016-09-07 MED FILL — HYDROCHLOROTHIAZIDE 25 MG T: 25 | 30 days supply | Qty: 30 | Fill #1

## 2016-10-03 ENCOUNTER — Ambulatory Visit: Payer: Self-pay

## 2016-10-03 MED FILL — HYDROCHLOROTHIAZIDE 25 MG T: 25 | 30 days supply | Qty: 30 | Fill #2

## 2016-10-25 ENCOUNTER — Encounter: Payer: Self-pay | Admitting: Internal Medicine

## 2016-10-25 ENCOUNTER — Telehealth: Payer: Self-pay | Admitting: *Deleted

## 2016-10-25 ENCOUNTER — Ambulatory Visit (INDEPENDENT_AMBULATORY_CARE_PROVIDER_SITE_OTHER): Payer: Self-pay | Admitting: Internal Medicine

## 2016-10-25 VITALS — Temp 98.0°F | Ht 62.0 in | Wt 219.0 lb

## 2016-10-25 DIAGNOSIS — E119 Type 2 diabetes mellitus without complications: Secondary | ICD-10-CM

## 2016-10-25 DIAGNOSIS — I10 Essential (primary) hypertension: Secondary | ICD-10-CM

## 2016-10-25 DIAGNOSIS — E1165 Type 2 diabetes mellitus with hyperglycemia: Secondary | ICD-10-CM

## 2016-10-25 DIAGNOSIS — E1121 Type 2 diabetes mellitus with diabetic nephropathy: Secondary | ICD-10-CM

## 2016-10-25 DIAGNOSIS — Z79899 Other long term (current) drug therapy: Secondary | ICD-10-CM

## 2016-10-25 DIAGNOSIS — Z7984 Long term (current) use of oral hypoglycemic drugs: Secondary | ICD-10-CM

## 2016-10-25 LAB — POCT URINALYSIS DIPSTICK
BILIRUBIN UA: NEGATIVE
Glucose, UA: 1000
KETONES UA: NEGATIVE
LEUKOCYTES UA: NEGATIVE
Nitrite, UA: NEGATIVE
PH UA: 6 (ref 5.0–8.0)
PROTEIN UA: NEGATIVE
Urobilinogen, UA: 0.2 E.U./dL

## 2016-10-25 LAB — POCT GLYCOSYLATED HEMOGLOBIN (HGB A1C): Hemoglobin A1C: 12.6

## 2016-10-25 LAB — GLUCOSE, CAPILLARY: Glucose-Capillary: 506 mg/dL (ref 65–99)

## 2016-10-25 MED ORDER — METFORMIN HCL 500 MG PO TABS: ORAL_TABLET | ORAL | 0 refills | Status: DC

## 2016-10-25 MED FILL — metFORMIN HCL 500 MG TABS: 500 | 20 days supply | Qty: 90 | Fill #0
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
## 2016-10-25 NOTE — Telephone Encounter (Signed)
Call from pt - stating not feeling well, feels like she might pass out, hot flashes- waking up w/ her shirt wet, slight nausea. No hx of diabetes but prediabetes. Appt given for today at 1545 PM in [facility].

## 2016-10-25 NOTE — Assessment & Plan Note (Signed)
The patient's BP was at goal of <140/90 today. She will continue current regimen of HCTZ 25 mg, daily.  Plan: -Continue HCTZ 25 mg daily -BMP today

## 2016-10-25 NOTE — Assessment & Plan Note (Addendum)
The patient's last recorded A1C was 6.4% on 10/2014. Today her A1C was 12.6%. Her POC urinalysis showed >1000 glucose in her urine, consistent with a diagnosis of diabetes. Her constellation of symptoms are also consistent with this diagnosis. This is a new diagnosis of diabetes. It is uncontrolled at this time. The patient was overwhelmed by diagnosis at the visit today. Towards the end of the visit she was hopeful that she could get disease under control with diet and exercise.The patient was counseled about lifestyle modifications, including a diet low and sugar and exercise, that can help manage her diabetes.   The patient was reluctant to begin medications for diabetes, especially since she had a friend who took metformin and reported that she didn't like it. The patient was counseled about her different options and ultimately decided to start taking metformin. She was started on metformin 500 mg daily. She was given instructions to escalate this therapy each week, so that she will take the maximum dose of 1000 mg BID in 4 weeks time. She was counseled about the side effects of metformin, which include abdominal discomfort. She will follow up in clinic in 4 weeks to see how she is doing with this new medication.   Plan: - Start metformin 500 mg daily, escalate over next 4 weeks to 1000 mg BID - BMP and UPC today in clinic, follow up with patient regarding results - Discuss a possible referral to clinic dietician at follow up appointment in 4 weeks.

## 2016-10-25 NOTE — Patient Instructions (Addendum)
You were diagnosed with Diabetes today.   Please START taking your metformin. Here are your instructions: Take 1 pill with dinner for 7 days. Then take 1 pill with breakfast and 1 pill with dinner for 7 days. Then take 1 pill with breakfast and 2 pills with dinner for 7 days. Then take 2 pills with breakfast and 2 pills with dinner for 7 days.  We recommend drinking only water, cutting out sugary drinks including soda, lemonade and ice tea, eating less bread and starches, and limiting your dessert intake. Use balsamic vinaigrette for your salad dressing - please avoid creamy salad dressings. Please see below for more diet recommendations.   You will follow up in clinic in 2-4 weeks so we can see how you are doing. Please call the clinic if you have any additional questions or concerns.   Diabetes Mellitus and Food It is important for you to manage your blood sugar (glucose) level. Your blood glucose level can be greatly affected by what you eat. Eating healthier foods in the appropriate amounts throughout the day at about the same time each day will help you control your blood glucose level. It can also help slow or prevent worsening of your diabetes mellitus. Healthy eating may even help you improve the level of your blood pressure and reach or maintain a healthy weight. General recommendations for healthful eating and cooking habits include:  Eating meals and snacks regularly. Avoid going long periods of time without eating to lose weight.  Eating a diet that consists mainly of plant-based foods, such as fruits, vegetables, nuts, legumes, and whole grains.  Using low-heat cooking methods, such as baking, instead of high-heat cooking methods, such as deep frying.  Work with your dietitian to make sure you understand how to use the Nutrition Facts information on food labels.  How can food affect me?  Carbohydrates Carbohydrates affect your blood glucose level more than any other type of food.  Your dietitian will help you determine how many carbohydrates to eat at each meal and teach you how to count carbohydrates. Counting carbohydrates is important to keep your blood glucose at a healthy level, especially if you are using insulin or taking certain medicines for diabetes mellitus.  Alcohol Alcohol can cause sudden decreases in blood glucose (hypoglycemia), especially if you use insulin or take certain medicines for diabetes mellitus. Hypoglycemia can be a life-threatening condition. Symptoms of hypoglycemia (sleepiness, dizziness, and disorientation) are similar to symptoms of having too much alcohol. If your health care provider has given you approval to drink alcohol, do so in moderation and use the following guidelines:  Women should not have more than one drink per day, and men should not have more than two drinks per day. One drink is equal to: ? 12 oz of beer. ? 5 oz of wine. ? 1 oz of hard liquor.  Do not drink on an empty stomach.  Keep yourself hydrated. Have water, diet soda, or unsweetened iced tea.  Regular soda, juice, and other mixers might contain a lot of carbohydrates and should be counted.  What foods are not recommended? As you make food choices, it is important to remember that all foods are not the same. Some foods have fewer nutrients per serving than other foods, even though they might have the same number of calories or carbohydrates. It is difficult to get your body what it needs when you eat foods with fewer nutrients. Examples of foods that you should avoid that are high  in calories and carbohydrates but low in nutrients include:  Trans fats (most processed foods list trans fats on the Nutrition Facts label).  Regular soda.  Juice.  Candy.  Sweets, such as cake, pie, doughnuts, and cookies.  Fried foods.  What foods can I eat? Eat nutrient-rich foods, which will nourish your body and keep you healthy. The food you should eat also will depend on  several factors, including:  The calories you need.  The medicines you take.  Your weight.  Your blood glucose level.  Your blood pressure level.  Your cholesterol level.  You should eat a variety of foods, including:  Protein. ? Lean cuts of meat. ? Proteins low in saturated fats, such as fish, egg whites, and beans. Avoid processed meats.  Fruits and vegetables. ? Fruits and vegetables that may help control blood glucose levels, such as apples, mangoes, and yams.  Dairy products. ? Choose fat-free or low-fat dairy products, such as milk, yogurt, and cheese.  Grains, bread, pasta, and rice. ? Choose whole grain products, such as multigrain bread, whole oats, and brown rice. These foods may help control blood pressure.  Fats. ? Foods containing healthful fats, such as nuts, avocado, olive oil, canola oil, and fish.  Does everyone with diabetes mellitus have the same meal plan? Because every person with diabetes mellitus is different, there is not one meal plan that works for everyone. It is very important that you meet with a dietitian who will help you create a meal plan that is just right for you. This information is not intended to replace advice given to you by your health care provider. Make sure you discuss any questions you have with your health care provider. Document Released: 12/28/2004 Document Revised: 09/08/2015 Document Reviewed: 02/27/2013 Elsevier Interactive Patient Education  2017 [employer].

## 2016-10-26 ENCOUNTER — Other Ambulatory Visit: Payer: Self-pay | Admitting: Internal Medicine

## 2016-10-26 LAB — URINALYSIS, ROUTINE W REFLEX MICROSCOPIC
Bilirubin, UA: NEGATIVE
Ketones, UA: NEGATIVE
Leukocytes, UA: NEGATIVE
NITRITE UA: NEGATIVE
PH UA: 5.5 (ref 5.0–7.5)
Protein, UA: NEGATIVE
RBC, UA: NEGATIVE
Specific Gravity, UA: >=1.03 — AB (ref 1.005–1.030)
UUROB: 0.2 mg/dL (ref 0.2–1.0)

## 2016-10-26 LAB — BMP8+ANION GAP
ANION GAP: 18 mmol/L (ref 10.0–18.0)
BUN/Creatinine Ratio: 17 (ref 9–23)
BUN: 16 mg/dL (ref 6–24)
CHLORIDE: 92 mmol/L — AB (ref 96–106)
CO2: 22 mmol/L (ref 20–29)
Calcium: 10.1 mg/dL (ref 8.7–10.2)
Creatinine, Ser: 0.93 mg/dL (ref 0.57–1.00)
GFR, EST AFRICAN AMERICAN: 79 mL/min/{1.73_m2} (ref >59)
GFR, EST NON AFRICAN AMERICAN: 68 mL/min/{1.73_m2} (ref >59)
GLUCOSE: 537 mg/dL — AB (ref 65–99)
POTASSIUM: 4.4 mmol/L (ref 3.5–5.2)
SODIUM: 132 mmol/L — AB (ref 134–144)

## 2016-10-26 LAB — MICROALBUMIN / CREATININE URINE RATIO
Creatinine, Urine: 26.6 mg/dL
Microalb/Creat Ratio: 28.6 mg/g creat (ref 0.0–30.0)
Microalbumin, Urine: 7.6 ug/mL

## 2016-10-26 NOTE — Progress Notes (Signed)
Paged about critical lab value. Patient's glucose 537. Patient seen in clinic today with new DM2 diagnosis (previously pre-DM). Started on metformin in clinic today.

## 2016-10-26 NOTE — Progress Notes (Signed)
Internal Medicine Clinic Attending  I saw and evaluated the patient.  I personally confirmed the key portions of the history and exam documented by Dr. Berneice Gandy and I reviewed pertinent patient test results.  The assessment, diagnosis, and plan were formulated together and I agree with the documentation in the resident's note. We discussed the diagnosis of DM, she had a lot of great questions and is currently very motivated.  We discussed staying hydrated and really tried to convince her to take metformin, will take things slow and have close follow up.

## 2016-10-29 ENCOUNTER — Telehealth: Payer: Self-pay | Admitting: *Deleted

## 2016-10-29 NOTE — Telephone Encounter (Signed)
Danielle Mason walked in today with questions abt Diabetes, counseled abt diet & exercise. She is newly diagnosed DM2. Was started on metformin 10/25/16.  Also read MD notes/ instructions from last visit. She would like to meet up with you asap. I told her I'll refer her to you & maybe set up appt with her when you're available. Thanks!

## 2016-11-02 MED FILL — HYDROCHLOROTHIAZIDE 25 MG T: 25 | 30 days supply | Qty: 30 | Fill #3

## 2016-11-05 ENCOUNTER — Encounter: Payer: Self-pay | Admitting: Internal Medicine

## 2016-11-05 ENCOUNTER — Ambulatory Visit (INDEPENDENT_AMBULATORY_CARE_PROVIDER_SITE_OTHER): Payer: Self-pay | Admitting: Internal Medicine

## 2016-11-05 VITALS — BP 126/80 | HR 69 | Temp 97.7°F | Ht 62.0 in | Wt 213.2 lb

## 2016-11-05 DIAGNOSIS — K219 Gastro-esophageal reflux disease without esophagitis: Secondary | ICD-10-CM

## 2016-11-05 DIAGNOSIS — I10 Essential (primary) hypertension: Secondary | ICD-10-CM

## 2016-11-05 DIAGNOSIS — Z7984 Long term (current) use of oral hypoglycemic drugs: Secondary | ICD-10-CM

## 2016-11-05 DIAGNOSIS — E119 Type 2 diabetes mellitus without complications: Secondary | ICD-10-CM

## 2016-11-05 LAB — GLUCOSE, CAPILLARY: GLUCOSE-CAPILLARY: 322 mg/dL — AB (ref 65–99)

## 2016-11-05 NOTE — Patient Instructions (Signed)
Thank you for coming in Danielle Mason.  Keep up the good work in Immunologist about your diet and other things that will help with your diabetes. A healthy diet and your Metformin should keep getting you in the right direction with your diabetes. We'll put in to have you see Danielle Mason as soon as she is able and keep your appointment with Danielle Mason coming up on the 8th.

## 2016-11-05 NOTE — Assessment & Plan Note (Signed)
The pt was recently diagnosed with DM, A1c of 12.6%. Today her bg has decreased to 322, urine microalbumin wnl. She remains a very motivated pt with the goal of controlling her diabetes with diet and exercise, though she understands Metformin is helpful. She is tolerating her current dose with instructions to titrate to 1000 mg BID. [name] is currently unavailable, but a referral was placed. We discussed limiting carbohydrate rich foods such as rice, bread, and grains, as well as portion control. She has been avoiding sugary drinks and drinking only water. A book was provided on diabetes diet information which she will refer to until meeting with nutrition to learn further.
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
## 2016-11-06 NOTE — Progress Notes (Signed)
Internal Medicine Clinic Attending  I saw and evaluated the patient.  I personally confirmed the key portions of the history and exam documented by Dr. Harden and I reviewed pertinent patient test results.  The assessment, diagnosis, and plan were formulated together and I agree with the documentation in the resident's note.  

## 2016-11-09 ENCOUNTER — Telehealth: Payer: Self-pay | Admitting: Dietician

## 2016-11-09 ENCOUNTER — Encounter: Payer: Self-pay | Admitting: Dietician

## 2016-11-09 NOTE — Telephone Encounter (Signed)
She has an appointment with me for 11/22/26. Thank you!

## 2016-11-09 NOTE — Telephone Encounter (Signed)
Called to discuss healthy options for eating out. Confirmed appointment for 11/21/16.

## 2016-11-14 ENCOUNTER — Telehealth: Payer: Self-pay

## 2016-11-14 NOTE — Telephone Encounter (Signed)
rtc lm for rtc 

## 2016-11-14 NOTE — Telephone Encounter (Signed)
Would like to speak with a nurse about a scratch on the finger. Needs to know what she should do, please call back.

## 2016-11-16 NOTE — Telephone Encounter (Signed)
Lm for rtc 

## 2016-11-19 NOTE — Telephone Encounter (Signed)
Per [facility], pt has an appt 11/21/16.

## 2016-11-21 ENCOUNTER — Ambulatory Visit (INDEPENDENT_AMBULATORY_CARE_PROVIDER_SITE_OTHER): Payer: Self-pay | Admitting: Internal Medicine

## 2016-11-21 ENCOUNTER — Other Ambulatory Visit: Payer: Self-pay | Admitting: Internal Medicine

## 2016-11-21 ENCOUNTER — Ambulatory Visit (INDEPENDENT_AMBULATORY_CARE_PROVIDER_SITE_OTHER): Payer: Self-pay | Admitting: Dietician

## 2016-11-21 VITALS — BP 112/68 | HR 100 | Temp 98.7°F | Wt 210.3 lb

## 2016-11-21 DIAGNOSIS — Z7984 Long term (current) use of oral hypoglycemic drugs: Secondary | ICD-10-CM

## 2016-11-21 DIAGNOSIS — Z823 Family history of stroke: Secondary | ICD-10-CM

## 2016-11-21 DIAGNOSIS — Z79899 Other long term (current) drug therapy: Secondary | ICD-10-CM

## 2016-11-21 DIAGNOSIS — I10 Essential (primary) hypertension: Secondary | ICD-10-CM

## 2016-11-21 DIAGNOSIS — E119 Type 2 diabetes mellitus without complications: Secondary | ICD-10-CM

## 2016-11-21 DIAGNOSIS — Z713 Dietary counseling and surveillance: Secondary | ICD-10-CM

## 2016-11-21 LAB — GLUCOSE, CAPILLARY: Glucose-Capillary: 116 mg/dL — ABNORMAL HIGH (ref 65–99)

## 2016-11-22 ENCOUNTER — Telehealth: Payer: Self-pay | Admitting: Dietician

## 2016-11-22 ENCOUNTER — Other Ambulatory Visit: Payer: Self-pay

## 2016-11-22 DIAGNOSIS — E119 Type 2 diabetes mellitus without complications: Secondary | ICD-10-CM

## 2016-11-22 LAB — CBC
HEMATOCRIT: 37.5 % (ref 34.0–46.6)
HEMOGLOBIN: 12.3 g/dL (ref 11.1–15.9)
MCH: 28.1 pg (ref 26.6–33.0)
MCHC: 32.8 g/dL (ref 31.5–35.7)
MCV: 86 fL (ref 79–97)
Platelets: 307 10*3/uL (ref 150–379)
RBC: 4.37 x10E6/uL (ref 3.77–5.28)
RDW: 14.6 % (ref 12.3–15.4)
WBC: 8.8 10*3/uL (ref 3.4–10.8)

## 2016-11-22 LAB — TSH: TSH: 0.982 u[IU]/mL (ref 0.450–4.500)

## 2016-11-22 NOTE — Progress Notes (Signed)
  Medical Nutrition Therapy:  Appt start time: 1310 end time:  1430. Visit # 1 with diabetes, have sen her before   Assessment:  Primary concerns today: meal planning for blood sugar control.  Danielle Mason is here with her husband for education about her newly diagnosed diabetes. She has already made many changes in her diet and is walking every morning. These changes have resulted in weight loss of 9#  Her husband is very supportive. She is wanting may foods that she has restricted/ WE discussed if going to have them to reintroduce them in limited portions, monitor her weight and if it stops decreasing she may have to omit them again. Preferred Learning Style: No preference indicated  Learning Readiness: Ready & Change in progress  ANTHROPOMETRICS: weight-210#, BMI-38 WEIGHT HISTORY: Highest: 230# Lowest-186#, spouse suggest weight goal of 150#, discussed that patient may be able to meet her goals at a higher weight SLEEP:10-11 Pm to 4 AM- not enough, sometimes naps MEDICATIONS: metformin BLOOD SUGAR: 110 today with A1C 12.6% on 10/25/16 DIETARY INTAKE: Usual eating pattern includes 2-3 meals and 1 snacks per day  Usual physical activity: walks daily around her church a few laps  Estimated energy needs: ~1200-1800 calories ~150-175 g carbohydrates ~50 g protein ~45 g fat  Progress Towards Goal(s):  In progress.   Nutritional Diagnosis:  NB-1.1 Food and nutrition-related knowledge deficit As related to lack of prior diabetes self management training.  As evidenced by her questions and report.    Intervention:  Nutrition education about genral diabetes, meal planning, glucose and A1C targets Coordination of care: discusserd with Dr. Charlynn Grimes  Teaching Method Utilized: Visual. Auditory. Hands on Handouts given during visit include: plate method, AVS Barriers to learning/adherence to lifestyle change: lack of material resources Demonstrated degree of understanding via:  Teach Back    Monitoring/Evaluation:  Dietary intake, exercise, meter, and body weight in 2 week(s)  Plyler, [name], RD 11/22/2016 5:32 PM. .

## 2016-11-22 NOTE — Telephone Encounter (Signed)
metFORMIN (GLUCOPHAGE) 500 MG tablet, refill request @outpatient  pharmacy.

## 2016-11-22 NOTE — Patient Instructions (Signed)
Dear Ms. Broxson, Thank you and your husband for coming to your appointment today! You are doing a great job taking care of your diabetes! Your weight is decreased 3 more pounds and your blood sugar was 110 today after lunch.  Sleep is very important to your health. Not having enough sleep can make your blood sugar higher and make it hard to make healthy choices. Aim for 7-9 hours of sleep every night.  Ideas for your meal plan: If you cannot get lunch by 1 PM or you have to delay a meal- have a small snack like a  cup fruit, a few crackers or a  cup orange juice to keep your blood sugar up until you can get your lunch/meal.  Remember you can use olives or fruit in your salads for flavor instead of the cheese and bacon.  You can use spices (garlic, onion, red pepper, cumin, black pepper, Mrs. Deliah Boston) and vegetables or olive or canola oil to season your beans I think I made a follow up appointment for you on August 23 at 10:15 PM, please call the office or me to verify- [phone]

## 2016-11-22 NOTE — Telephone Encounter (Signed)
Danielle Mason wanted to confirm what she was eating was okay and to ask for meter recommendation. Suggested [employer] meter [employer] supplies.

## 2016-11-23 ENCOUNTER — Encounter: Payer: Self-pay | Admitting: Internal Medicine

## 2016-11-23 MED ORDER — METFORMIN HCL 1000 MG PO TABS: ORAL_TABLET | ORAL | 0 refills | Status: DC

## 2016-11-23 MED FILL — metFORMIN HCL 1000 MG TABS: 1000 | 30 days supply | Qty: 60 | Fill #0

## 2016-11-25 NOTE — Assessment & Plan Note (Addendum)
Danielle Mason has been highly motivated since her diagnosis with type 2 diabetes last month. She reports taking the metformin 1000 mg bid. She does report that she does not like taking the medications. She denies any GI symptoms but reports that she feels fatigued and tired since she increased from 500 mg bid to 1000 mg bid. She does also report that she has recently started a new job and has been working long hours and not sleeping well at night. She reports walking 45 minutes daily over the past month. She has also been working on her diet and made great strides there as well. She has lost 9 lbs (intentional) since diagnosis. Meeting with donna for diet and nutrition.  Plan:  Follow up in 2 months for repeat A1C. Continue metformin 1000 mg bid Continue lifestyle modifications CBC and TSH today (fatigue) both unremarkable; suspect her fatigue is 2/2 to lack of sleep and over-extertion
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
## 2016-11-25 NOTE — Assessment & Plan Note (Addendum)
BP Readings from Last 3 Encounters:  11/21/16 112/68  11/05/16 126/80  06/12/16 139/72    Lab Results  Component Value Date   NA 132 (L) 10/25/2016   K 4.4 10/25/2016   CREATININE 0.93 10/25/2016   BP is well controlled on current regimen of HCTZ 25 mg daily. Denies any symptoms today; no dizziness/lightheadedness, vision changes, chest pain or palpitations.   Assessment: controlled HTN  Plan: Continue current medications today. Given recent DM diagnosis could consider ACE-I in the future though currently no microalbuminuria.

## 2016-11-26 NOTE — Addendum Note (Signed)
Addended by: Lalla Brothers T on: 11/26/2016 03:17 PM   Modules accepted: Level of Service

## 2016-11-26 NOTE — Progress Notes (Signed)
Internal Medicine Clinic Attending  Case discussed with Dr. Boswell at the time of the visit.  We reviewed the resident's history and exam and pertinent patient test results.  I agree with the assessment, diagnosis, and plan of care documented in the resident's note.  

## 2016-11-28 ENCOUNTER — Telehealth: Payer: Self-pay | Admitting: *Deleted

## 2016-11-28 MED FILL — metFORMIN HCL 500 MG TABS: 500 | 30 days supply | Qty: 120 | Fill #0

## 2016-11-28 NOTE — Telephone Encounter (Signed)
That sounds fine.  Thanks.

## 2016-11-28 NOTE — Telephone Encounter (Signed)
[facility] calls and ask if they can give pt 500mg  tabs of metformin instead of 1000mg  tabs due to pt c/o smell and taste of 1000mg  tabs- she states that brand does have an odd taste and smell. She is given a VO for 500mg  2 tabs 2 times daily with a meal. Do you agree? Also VO for it to be part of the IM program.

## 2016-11-30 ENCOUNTER — Telehealth: Payer: Self-pay | Admitting: Dietician

## 2016-11-30 NOTE — Telephone Encounter (Signed)
Ms. Kachel called for support and advice on what to eat when eating out.

## 2016-12-04 ENCOUNTER — Ambulatory Visit (INDEPENDENT_AMBULATORY_CARE_PROVIDER_SITE_OTHER): Payer: Self-pay | Admitting: Dietician

## 2016-12-04 DIAGNOSIS — Z713 Dietary counseling and surveillance: Secondary | ICD-10-CM

## 2016-12-04 DIAGNOSIS — E119 Type 2 diabetes mellitus without complications: Secondary | ICD-10-CM

## 2016-12-04 LAB — GLUCOSE, CAPILLARY: Glucose-Capillary: 111 mg/dL — ABNORMAL HIGH (ref 65–99)

## 2016-12-04 NOTE — Patient Instructions (Addendum)
Walking burns anywhere from 90 to 200 calories in 30 minutes.  You burn fewer calories if you walk at the strolling rate of a 30-minute mile. You burn more calories walking at the brisk rate of a 17-minute mile.   Yum-yums- 1 hot dog, 1 scoop ice cream and water or diet soda  [employer]- ask for doggie bag with meal, eat salad first, half spaghetti and 1 bread  [employer]- pork chop. 1 roll, salad, water with lemon  Fruit in it's own juice or no sugar added   Fried chicken- take the skin off  Bring your meter to next visit  A balanced meal- 1- protein- chicken 2-veggie- green beans 3- fruit- banana 4- starch- bread  Coconut pie- K & W- skip the fruit and starcfh- only eat low fat protein like baked chicken and veggie with the pie

## 2016-12-04 NOTE — Progress Notes (Signed)
  Medical Nutrition Therapy:  Appt start time: 1020 end time:  1120. Visit # 2 with diabetes  Assessment:  Primary concerns today: meal planning for blood sugar control.  Danielle Mason is here with her husband for education about meal planning for her newly diagnosed diabetes. She continues with smaller portions and walking 30-35 minutes every morning. . These changes have resulted in weight loss of 13#    ANTHROPOMETRICS: weight-206#, BMI-37 WEIGHT Goal: ? Plan to suggest ~200# SLEEP:1030 Pm to 550 AM- 7 hours- improved MEDICATIONS: metformin BLOOD SUGAR: 111 today after walking and breakfast of salad and meatloaf with oatmeal DIETARY INTAKE: Usual eating pattern includes 2-3 meals and 1 snacks per day, 9am, 3 Pm and 930 PM   Estimated energy needs: ~1200-1700 calories ~150-175 g carbohydrates ~50 g protein ~45 g fat  Progress Towards Goal(s):  In progress.   Nutritional Diagnosis:  NB-1.1 Food and nutrition-related knowledge deficit As related to lack of prior diabetes self management training.  As evidenced by her questions and report.    Intervention:  Nutrition education about general diabetes, meal planning, glucose and A1C targets Coordination of care: none  Teaching Method Utilized: Visual. Auditory. Hands on Handouts given during visit include: plate method, AVS Barriers to learning/adherence to lifestyle change: lack of material resources Demonstrated degree of understanding via:  Teach Back   Monitoring/Evaluation:  Dietary intake, exercise, meter, and body weight in 2 week(s)  Gracelee Stemmler, [name], RD 12/04/2016 11:28 AM. .

## 2016-12-06 ENCOUNTER — Ambulatory Visit: Payer: Self-pay | Admitting: Dietician

## 2016-12-07 MED FILL — HYDROCHLOROTHIAZIDE 25 MG T: 25 | 30 days supply | Qty: 30 | Fill #4

## 2016-12-08 ENCOUNTER — Emergency Department (HOSPITAL_COMMUNITY): Payer: Self-pay

## 2016-12-08 ENCOUNTER — Emergency Department (HOSPITAL_COMMUNITY)
Admission: EM | Admit: 2016-12-08 | Discharge: 2016-12-08 | Disposition: A | Discharge status: Home / Self Care | Payer: Self-pay | Attending: Emergency Medicine | Admitting: Emergency Medicine

## 2016-12-08 ENCOUNTER — Encounter (HOSPITAL_COMMUNITY): Payer: Self-pay

## 2016-12-08 DIAGNOSIS — E119 Type 2 diabetes mellitus without complications: Secondary | ICD-10-CM | POA: Insufficient documentation

## 2016-12-08 DIAGNOSIS — Z7984 Long term (current) use of oral hypoglycemic drugs: Secondary | ICD-10-CM | POA: Insufficient documentation

## 2016-12-08 DIAGNOSIS — M79671 Pain in right foot: Secondary | ICD-10-CM | POA: Insufficient documentation

## 2016-12-08 DIAGNOSIS — Z7982 Long term (current) use of aspirin: Secondary | ICD-10-CM | POA: Insufficient documentation

## 2016-12-08 DIAGNOSIS — I10 Essential (primary) hypertension: Secondary | ICD-10-CM | POA: Insufficient documentation

## 2016-12-08 DIAGNOSIS — Z91040 Latex allergy status: Secondary | ICD-10-CM | POA: Insufficient documentation

## 2016-12-08 DIAGNOSIS — Z79899 Other long term (current) drug therapy: Secondary | ICD-10-CM | POA: Insufficient documentation
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
## 2016-12-08 NOTE — ED Triage Notes (Signed)
Pt dx'd with diabetes last month and has started walking every day. She states that recently her R foot has been bothering her. She thinks she just might need more supportive shoes, but she wants to make sure its not related to her diabetes.

## 2016-12-08 NOTE — Discharge Instructions (Signed)
You can take Tylenol as directed for pain.  Apply ice to the affected area. Keep the foot elevated for relief of swelling.  As we discussed, get supportive shoes to help you when you exercise.  Follow-up with your primary care doctor in 1 week if her symptoms are not improving or sooner if they worsen.  Return the emergency Department for any worsening pain, redness/swelling of the foot, fever, warmth of the foot or any other worsening or concerning symptoms.

## 2016-12-11 ENCOUNTER — Ambulatory Visit (INDEPENDENT_AMBULATORY_CARE_PROVIDER_SITE_OTHER): Payer: Self-pay | Admitting: Internal Medicine

## 2016-12-11 ENCOUNTER — Encounter: Payer: Self-pay | Admitting: Internal Medicine

## 2016-12-11 VITALS — BP 129/72 | HR 88 | Temp 97.8°F | Ht 62.0 in | Wt 202.3 lb

## 2016-12-11 DIAGNOSIS — E663 Overweight: Secondary | ICD-10-CM

## 2016-12-11 DIAGNOSIS — Z7984 Long term (current) use of oral hypoglycemic drugs: Secondary | ICD-10-CM

## 2016-12-11 DIAGNOSIS — L989 Disorder of the skin and subcutaneous tissue, unspecified: Secondary | ICD-10-CM

## 2016-12-11 DIAGNOSIS — M79671 Pain in right foot: Secondary | ICD-10-CM

## 2016-12-11 DIAGNOSIS — R600 Localized edema: Secondary | ICD-10-CM

## 2016-12-11 DIAGNOSIS — E119 Type 2 diabetes mellitus without complications: Secondary | ICD-10-CM
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
## 2016-12-11 NOTE — Assessment & Plan Note (Addendum)
Patient presenting with foot pain along the medial aspect of right foot that started "a long time ago" and has progressively worsened over the past 3-4 weeks when she started exercising (walking 76min - 1hr 6 days a week). Reports she also started to wear high heels recently. States the pain is intermittent and a 2-3/10. Has not tried over-the-counter medications for the pain. Pain is worsened by movement and improves with rest. Denies recent trauma or falls. She presented to the ED 3 days ago (8/25) after developing a red lesion in her R foot and was told she could be experiencing a gout flare given use of HCTZ for BP control. However, she has no history of gout in symptoms not consistent with this. XR with mild soft tissue swelling, but no fractures or dislocation noted. Patient states swelling is chronic in nature and has been improvement in the past few weeks. Patient declined treatment for gout in the ED. He has been applying ice to affected area with improvement in pain.   On exam there is a small red lesion consistent with a contusion in the medial aspect of right foot. There is no erythema or warmth associated to it. Very mild swelling noted on dorsal aspect of right foot. No skin breakdown or ulcerations. Low arch noted. Patient was recently diagnosed with type 2 diabetes with an A1c of 12.6. Low suspicion for infection or ulceration given findings on exam. No fractures or dislocations seen on XR. Very low suspicion for gout at this time given no history the past and no findings consistent with this diagnosis on exam. Reassured patient that symptoms are not secondary to gout as she was extremely concerned about this. Suspect this is secondary to recent increase in physical activity as patient recently started exercising and has not been wearing adequate footwear.   Right foot pain:  - Continue to apply ice to affected area - Advised patient to purchase high arch insole for sneakers for better support  during exercise - Advised patient to take over-the-counter Tylenol as needed for pain - Advised to call internal medicine clinic if symptoms worsen or do not improve

## 2016-12-11 NOTE — Patient Instructions (Addendum)
It was nice to meet you today, Danielle Mason.   Consider purchasing an insert for your sneakers for better support while walking. You can limit your walking to 30 minutes a day while the pain your right foot improves.   Continue using ice in the affected area. You can also take over the counter Tylenol as you needed for pain.   Keep up the great work and continue exercising!   Please call the internal medicine clinic if your symptoms worsen or if you have any questions.

## 2016-12-11 NOTE — Progress Notes (Signed)
Internal Medicine Clinic Attending  I saw and evaluated the patient.  I personally confirmed the key portions of the history and exam documented by Dr. Santos-Sanchez and I reviewed pertinent patient test results.  The assessment, diagnosis, and plan were formulated together and I agree with the documentation in the resident's note. 

## 2016-12-11 NOTE — Assessment & Plan Note (Signed)
Patient recently diagnosed with T2DM with an A1c of 12.6. Currently on metformin 1000 mg BID  and compliant. States she has been following a low carbohydrate diet and has been exercising. She is currently walking 94min-1hr  6x/week. Has lost 17 lbs since last seen on 10/25/2016.   - Patient congratulated on accomplishment and encouraged to keep up diet and exercise regimen  - Continue metformin 1000 mg BID - She has a scheduled follow-up appointment with her PCP (Dr. Charlynn Grimes) on 01/23/2017

## 2016-12-18 ENCOUNTER — Ambulatory Visit (INDEPENDENT_AMBULATORY_CARE_PROVIDER_SITE_OTHER): Payer: Self-pay | Admitting: Dietician

## 2016-12-18 ENCOUNTER — Encounter: Payer: Self-pay | Admitting: Dietician

## 2016-12-18 VITALS — Wt 202.8 lb

## 2016-12-18 DIAGNOSIS — Z713 Dietary counseling and surveillance: Secondary | ICD-10-CM

## 2016-12-18 DIAGNOSIS — E119 Type 2 diabetes mellitus without complications: Secondary | ICD-10-CM

## 2016-12-18 DIAGNOSIS — Z7984 Long term (current) use of oral hypoglycemic drugs: Secondary | ICD-10-CM

## 2016-12-18 LAB — GLUCOSE, CAPILLARY: Glucose-Capillary: 91 mg/dL (ref 65–99)

## 2016-12-18 NOTE — Progress Notes (Signed)
  Medical Nutrition Therapy:  Appt start time: 8469 end time:  6295. Visit # 3 with diabetes  Assessment:  Primary concerns today: meal planning for blood sugar control.  Danielle Mason is here for follow up education about meal planning for her newly diagnosed diabetes. She continues with smaller portions and walking 30-35 minutes every morning except for the past week due to foot problems. These changes have resulted in weight loss of 16#  . She is not interested in keeping food and activity records.  ANTHROPOMETRICS: weight-202.8#, BMI-36.95, reasonable weight goal with BMI 30-32 and weight of ~ 175# 180# WEIGHT Goal: ? Plan to suggest ~200# SLEEP:1000 Pm to 500 AM- 7 hours- improved MEDICATIONS: metformin BLOOD SUGAR: 91 today after and breakfast of oatmeal, 1 slice whole grain bread, cheese and egg, 2 slices [place] bacon DIETARY INTAKE: Usual eating pattern includes 2-3 meals and 1 snacks per day, 9am, 3 Pm and 930 PM . eating smaller portions, less rice, bread, potatoes, fried foods, fruit 2-3 servings a day, veggies 2 servings a day, whole grain 2 servings a day  Estimated energy needs: ~1200-1700 calories ~150-175 g carbohydrates ~50 g protein ~45 g fat  Progress Towards Goal(s):  Some progress.   Nutritional Diagnosis:  NB-1.1 Food and nutrition-related knowledge deficit As related to lack of prior diabetes self management training is improving  As evidenced by her questions weight loss. Improved blood sugar control and report.    Intervention:  Nutrition education and support  about general diabetes, meal planning, glucose and A1C targets Coordination of care: none  Teaching Method Utilized: Visual. Auditory. Hands on Handouts given during visit include: plate method, AVS Barriers to learning/adherence to lifestyle change: lack of material resources Demonstrated degree of understanding via:  Teach Back   Monitoring/Evaluation:  Dietary intake, exercise, meter, and body weight in  2 week(s)  Plyler, Danielle Mason, RD 12/18/2016 11:12 AM. .

## 2016-12-18 NOTE — Patient Instructions (Signed)
91 after eating two meals today is fantastic.   Keep walking 5 days a week.   You are doing great at eating veggies and fruits and whole grain.    Look for whole grain wheat cereal- post raisin bran and oatmeal are whole grain.  Diary- try 2% milk cheeses, - can get calcium from salmon and orange juice fortified with calcium.

## 2016-12-27 MED FILL — metFORMIN HCL 500 MG TABS: 500 | 30 days supply | Qty: 120 | Fill #1

## 2017-01-01 ENCOUNTER — Ambulatory Visit: Payer: Self-pay | Admitting: Dietician

## 2017-01-02 ENCOUNTER — Ambulatory Visit (INDEPENDENT_AMBULATORY_CARE_PROVIDER_SITE_OTHER): Payer: Self-pay | Admitting: Dietician

## 2017-01-02 ENCOUNTER — Encounter: Payer: Self-pay | Admitting: Dietician

## 2017-01-02 VITALS — Wt 201.9 lb

## 2017-01-02 DIAGNOSIS — Z713 Dietary counseling and surveillance: Secondary | ICD-10-CM

## 2017-01-02 DIAGNOSIS — E119 Type 2 diabetes mellitus without complications: Secondary | ICD-10-CM

## 2017-01-02 LAB — POCT GLYCOSYLATED HEMOGLOBIN (HGB A1C): Hemoglobin A1C: 6.4

## 2017-01-02 LAB — GLUCOSE, CAPILLARY: Glucose-Capillary: 92 mg/dL (ref 65–99)

## 2017-01-02 NOTE — Patient Instructions (Addendum)
A dream with  A date is a goal.  Short term goal: by Christmas this year I weigh 175#- 185#  Long term goals: enjoy life with kids and grandkids  Support plan to help me get to those goals: Keep positive mind   Your Exercise Prescription:   RX:  At least 150 minutes a week of moderate intensity exercise. Walking, bicycling, stationary bicycling, dancing.  (moderate intensity means to get  a little out of breath)  Do resistance exercise at least 2 times a week.  This can be yoga poses or strength training where you lift your own weight (think leg lifts or toe raises)  or light weights like cans of beans with your arms.   Flexibility and balance exercise- safe stretching and practicing balance is very important to health.

## 2017-01-02 NOTE — Progress Notes (Signed)
  Medical Nutrition Therapy:  Appt start time: 258 end time: 10:20 Visit # 4 with diabetes  Assessment:  Primary concerns today: meal planning for blood sugar control.  Ms. Sommers is here with grandson for follow up education about meal planning for her newly diagnosed diabetes. She continues with smaller portions and walking 30-35 minutes 2-3 times a week. These changes have resulted in weight loss of 17# . Her weight loss has slowed to 1/2 pound per week.  ANTHROPOMETRICS: weight-201.9#, BMI-36.93, reasonable weight goal with BMI 30-32 and weight of ~ 175#- 180# SLEEP:1 MEDICATIONS: metformin BLOOD SUGAR: 91 today after breakfast of oatmeal, 4 oz juice, egg, 2 slices [place] bacon, N2D 6.4% DIETARY INTAKE: Usual eating pattern includes 2-3 meals and 1 snacks per day, 9am, 3 Pm and 930 PM   Estimated energy needs: ~1200-1700 calories ~150-175 g carbohydrates ~50 g protein ~45 g fat  Progress Towards Goal(s):  Some progress.   Nutritional Diagnosis:  NB-1.1 Food and nutrition-related knowledge deficit As related to lack of prior diabetes self management training is improving  As evidenced by her questions weight loss, Improved blood sugar control and report.    Intervention:  Nutrition education and support  about general diabetes, meal planning, glucose and A1C targets Coordination of care: suggest lipid panel recheck  Teaching Method Utilized: Visual. Auditory. Hands on Handouts given during visit include: plate method, AVS Barriers to learning/adherence to lifestyle change: lack of material resources Demonstrated degree of understanding via:  Teach Back   Monitoring/Evaluation:  Dietary intake, exercise, meter, and body weight in 3 week(s)  Arryn Terrones, [name], RD 01/02/2017 9:43 AM. .

## 2017-01-06 MED FILL — HYDROCHLOROTHIAZIDE 25 MG T: 25 | 30 days supply | Qty: 30 | Fill #5

## 2017-01-23 ENCOUNTER — Ambulatory Visit (INDEPENDENT_AMBULATORY_CARE_PROVIDER_SITE_OTHER): Payer: Self-pay | Admitting: Internal Medicine

## 2017-01-23 ENCOUNTER — Encounter: Payer: Self-pay | Admitting: Internal Medicine

## 2017-01-23 ENCOUNTER — Ambulatory Visit (INDEPENDENT_AMBULATORY_CARE_PROVIDER_SITE_OTHER): Payer: Self-pay | Admitting: Dietician

## 2017-01-23 ENCOUNTER — Encounter: Payer: Self-pay | Admitting: Dietician

## 2017-01-23 DIAGNOSIS — Z0000 Encounter for general adult medical examination without abnormal findings: Secondary | ICD-10-CM

## 2017-01-23 DIAGNOSIS — Z79899 Other long term (current) drug therapy: Secondary | ICD-10-CM

## 2017-01-23 DIAGNOSIS — Z713 Dietary counseling and surveillance: Secondary | ICD-10-CM

## 2017-01-23 DIAGNOSIS — R9389 Abnormal findings on diagnostic imaging of other specified body structures: Secondary | ICD-10-CM

## 2017-01-23 DIAGNOSIS — I10 Essential (primary) hypertension: Secondary | ICD-10-CM

## 2017-01-23 DIAGNOSIS — E119 Type 2 diabetes mellitus without complications: Secondary | ICD-10-CM

## 2017-01-23 LAB — GLUCOSE, CAPILLARY: GLUCOSE-CAPILLARY: 83 mg/dL (ref 65–99)

## 2017-01-23 MED ORDER — TRIAMCINOLONE ACETONIDE 0.1 % EX CREA: 1.0000 "application " | TOPICAL_CREAM | Freq: Two times a day (BID) | CUTANEOUS | 2 refills | Status: DC

## 2017-01-23 NOTE — Progress Notes (Signed)
  Medical Nutrition Therapy:  Appt start time: 1320 end time: 1405 Visit # 5  Assessment:  Primary concerns today: meal planning for blood sugar control.  Danielle Mason is here for follow up education about meal planning for her newly diagnosed diabetes. She continues with smaller portions and walking 30-35 minutes 2-3 times a week. These changes have resulted in weight loss of 20# . Her weight loss is~ 1/2 -1 pound per week. She is working in her favorite foods many of which are fried and eating out often and doing her best to eat small portions and choose lower fat methods of cooking.   ANTHROPOMETRICS: weight-198.6#, BMI-36.32, reasonable weight goal with BMI 30-32 and weight of <185# SLEEP:6 hours MEDICATIONS: metformin BLOOD SUGAR: 83 1-2 hours after good size lunch, A1C 6.4% DIETARY INTAKE: Usual eating pattern includes 2-3 meals and 1 snacks per day, 9am, 3 Pm and 930 PM   Estimated energy needs: ~1200-1700 calories ~150-175 g carbohydrates ~50 g protein ~45 g fat  Progress Towards Goal(s):  Some progress.   Nutritional Diagnosis:  NB-1.1 Food and nutrition-related knowledge deficit As related to lack of prior diabetes self management training is improving  As evidenced by her questions weight loss, Improved blood sugar control and report.    Intervention:  Nutrition education and support  about meal planning for weight loss, glucose targets, asked her to work on getting at least 7 hours rest at night.  Coordination of care: suggest lipid panel recheck  Teaching Method Utilized: Visual. Auditory. Hands on Handouts given during visit include: plate method, AVS Barriers to learning/adherence to lifestyle change: lack of material resources Demonstrated degree of understanding via:  Teach Back   Monitoring/Evaluation:  Dietary intake, exercise, meter, and body weight in 4 week(s)  Mason, Danielle Penny, RD 01/23/2017 4:08 PM. .

## 2017-01-23 NOTE — Assessment & Plan Note (Addendum)
Last PAP smear 06/2011 was negative for any intraepithelial lesions or malignancy. Co-testing not performed. Overdue for PAP smear today. Patient declined PAP smear today. Agreed to follow up in 1 month for PAP smear with me.  Patient declined pneumovax and flu shot today

## 2017-01-23 NOTE — Assessment & Plan Note (Addendum)
Lab Results  Component Value Date   HGBA1C 6.4 01/02/2017   HGBA1C 12.6 10/25/2016   HGBA1C 6.4 11/10/2014    Diagnosed 10/25/2016 with A1C 12.6. She was started on metformin (now titrated to 1000 mg bid) and consulted about lifestyle changes. She has done extremely well and most recent A1c at 9/19 visit with our nutritionist being 6.4. She has been exercising regularly and made dietary changes. Significant intentional weight loss 219 lbs > 198 lbs. Exercising daily, walking.   She wishes to stop the metformin. Denies any side effects but does not like taking the medication.   A/P: DM is well controlled and now in the pre-DM range. Will stop metformin and monitor per patient's request. Discussed adding ACE-I for renal protection. Patient declined. Will monitor with annual microalbuminuria.  Follow up in 6 months.

## 2017-01-23 NOTE — Assessment & Plan Note (Signed)
Mass resolved on follow up CXR though never documented. Will resolve problem today.

## 2017-01-23 NOTE — Patient Instructions (Signed)
Healthy eating out-  Remember eat half a plate of veggies Meat- try to eat less calories- 1 fried/high fat food at a meal

## 2017-01-23 NOTE — Patient Instructions (Signed)
Danielle Mason,  Danielle Mason are doing great, keep up the good work! Continue to exercise and keep up with the healthy eating.   Please see me in [facility] next month for a PAP smear. Follow up with me in 6 months for follow up.

## 2017-01-23 NOTE — Assessment & Plan Note (Addendum)
BP Readings from Last 3 Encounters:  01/23/17 132/79  12/11/16 129/72  12/08/16 (!) 138/93    Lab Results  Component Value Date   NA 132 (L) 10/25/2016   K 4.4 10/25/2016   CREATININE 0.93 10/25/2016   Currently on HCTZ 25 mg daily only. Blood pressure is well controlled on this regimen. Denies any headaches, vision changes, chest pain, shortness of breath, or palpations.   BP 132/79 today.  A/P: Continue current medications
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
## 2017-01-31 MED FILL — HYDROCHLOROTHIAZIDE 25 MG T: 25 | 30 days supply | Qty: 30 | Fill #6

## 2017-01-31 MED FILL — metFORMIN HCL 500 MG TABS: 500 | 30 days supply | Qty: 120 | Fill #2

## 2017-02-04 NOTE — Progress Notes (Signed)
Case discussed with Dr. Boswell at the time of the visit. We reviewed the resident's history and exam and pertinent patient test results. I agree with the assessment, diagnosis, and plan of care documented in the resident's note. 

## 2017-02-18 ENCOUNTER — Ambulatory Visit (INDEPENDENT_AMBULATORY_CARE_PROVIDER_SITE_OTHER): Payer: Self-pay | Admitting: Internal Medicine

## 2017-02-18 DIAGNOSIS — Z01419 Encounter for gynecological examination (general) (routine) without abnormal findings: Secondary | ICD-10-CM

## 2017-02-18 DIAGNOSIS — Z0000 Encounter for general adult medical examination without abnormal findings: Secondary | ICD-10-CM
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
## 2017-02-18 NOTE — Patient Instructions (Signed)
Danielle Mason,  We will let you know if anything comes back abnormal. Follow up with me in 6 months.

## 2017-02-18 NOTE — Progress Notes (Signed)
Internal Medicine Clinic Attending  Case discussed with Dr. Boswell at the time of the visit.  We reviewed the resident's history and exam and pertinent patient test results.  I agree with the assessment, diagnosis, and plan of care documented in the resident's note.  

## 2017-02-18 NOTE — Assessment & Plan Note (Addendum)
Patient presenting for PAP smear examination. Preformed with nurse Regino Schultze as chaperone. GU exam normal. PAP smear with co-testing sent. Will follow up results.  Addendum: Co-testing negative. Discussed results with patient over the phone.

## 2017-02-18 NOTE — Addendum Note (Signed)
Addended by: Gilles Chiquito B on: 02/18/2017 03:02 PM   Modules accepted: Level of Service

## 2017-02-20 ENCOUNTER — Ambulatory Visit: Payer: Self-pay | Admitting: Dietician

## 2017-02-20 LAB — CYTOLOGY - PAP
DIAGNOSIS: REACTIVE
Diagnosis: NEGATIVE
HPV (WINDOPATH): NOT DETECTED

## 2017-02-26 ENCOUNTER — Ambulatory Visit (INDEPENDENT_AMBULATORY_CARE_PROVIDER_SITE_OTHER): Payer: Self-pay | Admitting: Dietician

## 2017-02-26 VITALS — Wt 195.5 lb

## 2017-02-26 DIAGNOSIS — E785 Hyperlipidemia, unspecified: Secondary | ICD-10-CM

## 2017-02-26 LAB — GLUCOSE, CAPILLARY: GLUCOSE-CAPILLARY: 92 mg/dL (ref 65–99)

## 2017-02-26 NOTE — Patient Instructions (Addendum)
Your Exercise Prescription:   RX:  At least 150 minutes a week of moderate intensity exercise. Walking, bicycling, stationary bicycling, dancing.  (moderate intensity means to get  a little out of breath)  Add one more day to make four days a week of walking and you should get the 150 minutes a week.   If you want to count steps, start with a goal of 5000 steps a day and work up to 10,000 steps a day.   Great job on weight loss.  Happy Thanksgiving!  Remember the divided PLATE and have your cake later or the next day. Try to get a walk in if the weather is nice!

## 2017-02-26 NOTE — Progress Notes (Signed)
  Medical Nutrition Therapy:  Appt start time: 1308 end time: 1100 Visit # 6  Assessment:  Primary concerns today: meal planning for blood sugar control.  Ms. Richins is here for follow up education about meal planning for her diabetes. She continues with smaller portions and walking 30-35 minutes 2-3 times a week. These changes have resulted in weight loss of 24# . Her weight loss is~ 1/2 -1 pound per week. She is working in her favorite foods and eating out and doing her best to eat small portions and choose lower fat methods of cooking.   ANTHROPOMETRICS: weight-195.5#, BMI-35.76, reasonable weight goal with BMI 30-32 and weight of <185# SLEEP:5-6 hours MEDICATIONS: confirms she is taking metformin, HCTZ, Aspirin BLOOD SUGAR: 92 5 hours after breakfast, A1C 6.4% DIETARY INTAKE: Breakfast- sausage link & brown bread, water Lunch- [employer]- fried chicken with skin removed, 1 roll, cabbage, water  Dinner- [employer]- grilled pork chop, 2 rolls, salad with lite dressing, mushrooms & onions, bites of corn , water  Estimated energy needs: ~1200-1700 calories ~150-175 g carbohydrates ~50 g protein ~45 g fat  Progress Towards Goal(s):  Some progress.   Nutritional Diagnosis:  NB-1.1 Food and nutrition-related knowledge deficit As related to lack of prior diabetes self management training is improving  As evidenced by her questions weight loss, Improved blood sugar control and report.    Intervention:  Nutrition education and support  encouraging at least maintining weight and activity during the holidays and winter , respectively. Reviewed glucose targets, asked her to work on getting 4 days of walking or 5000-10000 steps a day along with decreasing the fat in her sausage  Coordination of care: lipid panel done today per Dr. Shanna Cisco approval  Teaching Method Utilized: Visual. Auditory. Hands on Handouts given during visit include: plate method, AVS Barriers to learning/adherence  to lifestyle change: lack of material resources Demonstrated degree of understanding via:  Teach Back   Monitoring/Evaluation:  Dietary intake, exercise, meter, and body weight in 4 week(s)  Ferne Ellingwood, [name], RD 02/26/2017 11:03 AM. .

## 2017-02-27 ENCOUNTER — Encounter: Payer: Self-pay | Admitting: Internal Medicine

## 2017-02-27 LAB — LIPID PANEL
CHOL/HDL RATIO: 5.3 ratio — AB (ref 0.0–4.4)
Cholesterol, Total: 222 mg/dL — ABNORMAL HIGH (ref 100–199)
HDL: 42 mg/dL (ref >39)
LDL Calculated: 159 mg/dL — ABNORMAL HIGH (ref 0–99)
Triglycerides: 107 mg/dL (ref 0–149)
VLDL Cholesterol Cal: 21 mg/dL (ref 5–40)

## 2017-02-28 ENCOUNTER — Telehealth: Payer: Self-pay | Admitting: Dietician

## 2017-02-28 ENCOUNTER — Encounter: Payer: Self-pay | Admitting: Dietician

## 2017-02-28 NOTE — Telephone Encounter (Signed)
Patient asked for a call with results of her lipid profile.

## 2017-03-05 ENCOUNTER — Ambulatory Visit (INDEPENDENT_AMBULATORY_CARE_PROVIDER_SITE_OTHER): Payer: Self-pay | Admitting: Internal Medicine

## 2017-03-05 ENCOUNTER — Other Ambulatory Visit: Payer: Self-pay

## 2017-03-05 DIAGNOSIS — Y92009 Unspecified place in unspecified non-institutional (private) residence as the place of occurrence of the external cause: Secondary | ICD-10-CM

## 2017-03-05 DIAGNOSIS — W19XXXA Unspecified fall, initial encounter: Secondary | ICD-10-CM

## 2017-03-05 DIAGNOSIS — W07XXXA Fall from chair, initial encounter: Secondary | ICD-10-CM

## 2017-03-05 DIAGNOSIS — S0990XA Unspecified injury of head, initial encounter: Secondary | ICD-10-CM

## 2017-03-05 DIAGNOSIS — Y92811 Bus as the place of occurrence of the external cause: Secondary | ICD-10-CM

## 2017-03-05 DIAGNOSIS — R296 Repeated falls: Secondary | ICD-10-CM

## 2017-03-05 DIAGNOSIS — E785 Hyperlipidemia, unspecified: Secondary | ICD-10-CM

## 2017-03-05 DIAGNOSIS — Y93I9 Activity, other involving external motion: Secondary | ICD-10-CM

## 2017-03-05 MED ORDER — ATORVASTATIN CALCIUM 40 MG PO TABS: 40.0000 mg | ORAL_TABLET | Freq: Every day | ORAL | 2 refills | Status: DC

## 2017-03-05 MED FILL — ATORVASTATIN 40 MG TABLET: 40 | 30 days supply | Qty: 30 | Fill #0
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
## 2017-03-05 NOTE — Telephone Encounter (Signed)
Left message for return call. Mailed her a copy with note that we can discuss the results at her next visit.
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
## 2017-03-06 DIAGNOSIS — W19XXXA Unspecified fall, initial encounter: Secondary | ICD-10-CM | POA: Insufficient documentation

## 2017-03-06 DIAGNOSIS — Y92009 Unspecified place in unspecified non-institutional (private) residence as the place of occurrence of the external cause: Secondary | ICD-10-CM

## 2017-03-06 NOTE — Assessment & Plan Note (Signed)
10 year ASCVD risk score 22% based on recent lipid panel. Discussed starting a statin today and patient was agreeable.  Will start Lipitor 40 mg daily today.
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
## 2017-03-06 NOTE — Progress Notes (Signed)
Internal Medicine Clinic Attending  Case discussed with Dr. Boswell at the time of the visit.  We reviewed the resident's history and exam and pertinent patient test results.  I agree with the assessment, diagnosis, and plan of care documented in the resident's note.  

## 2017-03-12 ENCOUNTER — Other Ambulatory Visit: Payer: Self-pay | Admitting: Internal Medicine

## 2017-03-12 MED FILL — HYDROCHLOROTHIAZIDE 25 MG T: 25 | 30 days supply | Qty: 30 | Fill #7

## 2017-03-13 ENCOUNTER — Other Ambulatory Visit: Payer: Self-pay

## 2017-03-13 ENCOUNTER — Other Ambulatory Visit: Payer: Self-pay | Admitting: Pharmacist

## 2017-03-13 ENCOUNTER — Ambulatory Visit (INDEPENDENT_AMBULATORY_CARE_PROVIDER_SITE_OTHER): Payer: Self-pay | Admitting: Internal Medicine

## 2017-03-13 DIAGNOSIS — E119 Type 2 diabetes mellitus without complications: Secondary | ICD-10-CM

## 2017-03-13 DIAGNOSIS — J069 Acute upper respiratory infection, unspecified: Secondary | ICD-10-CM

## 2017-03-13 DIAGNOSIS — B9789 Other viral agents as the cause of diseases classified elsewhere: Secondary | ICD-10-CM

## 2017-03-13 LAB — GLUCOSE, CAPILLARY: GLUCOSE-CAPILLARY: 121 mg/dL — AB (ref 65–99)

## 2017-03-13 MED ORDER — METFORMIN HCL 1000 MG PO TABS
ORAL_TABLET | ORAL | 2 refills | Status: DC
Start: 2017-03-13 — End: 2017-03-13

## 2017-03-13 MED ORDER — LORATADINE 10 MG PO TABS: 10.0000 mg | ORAL_TABLET | Freq: Every day | ORAL | 2 refills | Status: DC

## 2017-03-13 MED ORDER — DEXTROMETHORPHAN-GUAIFENESIN 10-100 MG/5ML PO LIQD: 5.0000 mL | ORAL | 0 refills | Status: DC | PRN

## 2017-03-13 MED ORDER — METFORMIN HCL 1000 MG PO TABS: 1000.0000 mg | ORAL_TABLET | Freq: Two times a day (BID) | ORAL | 2 refills | Status: DC

## 2017-03-13 MED FILL — metFORMIN HCL 1000 MG TABS: 1000 | 34 days supply | Qty: 68 | Fill #0

## 2017-03-13 NOTE — Assessment & Plan Note (Signed)
Reports she is doing well on the metformin without any side effects at the 1000 mg bid dosing. She is willing to continue the medication currently and is requesting a refill today.   Re-start metformin 1000 mg bid.

## 2017-03-13 NOTE — Progress Notes (Signed)
Medicine attending: Medical history, presenting problems, physical findings, and medications, reviewed with resident physician Dr Nathan Boswell on the day of the patient visit and I concur with his evaluation and management plan. 

## 2017-03-13 NOTE — Patient Instructions (Signed)
Danielle Mason,  I think you have a virus that is causing your symptoms and I think you have some allergies that may be playing a role as well. Lets try a cough and congestion syrup to help with the symptoms and an allergy pill. If you aren't feeling any better in next 10 days let us know.
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
## 2017-03-13 NOTE — Assessment & Plan Note (Signed)
Patient with 2 day history of viral URI with cough. May have some allergic rhinitis component as well as patient has recurrent episodes at this time of the year with the same presentation. No signs of bacterial infection today or PNA.   Will treat with Robitussin DM and Claritin. Return precautions given.

## 2017-03-13 NOTE — Progress Notes (Signed)
Prescription re-sent through [facility] outpatient [facility] IM program.

## 2017-03-25 ENCOUNTER — Ambulatory Visit: Payer: Self-pay

## 2017-03-26 ENCOUNTER — Ambulatory Visit: Payer: Self-pay | Admitting: Dietician

## 2017-03-28 ENCOUNTER — Ambulatory Visit: Payer: Self-pay | Admitting: Dietician

## 2017-03-28 ENCOUNTER — Telehealth: Payer: Self-pay | Admitting: Dietician

## 2017-03-28 NOTE — Telephone Encounter (Signed)
Rescheduled for Tuesday 04/02/17.

## 2017-04-02 ENCOUNTER — Ambulatory Visit: Payer: Self-pay | Admitting: Dietician

## 2017-04-04 MED FILL — DICLOFENAC SODIUM 1% GEL: 1 | 6 days supply | Qty: 100 | Fill #1

## 2017-04-04 MED FILL — HYDROCHLOROTHIAZIDE 25 MG T: 25 | 30 days supply | Qty: 30 | Fill #8

## 2017-04-12 ENCOUNTER — Other Ambulatory Visit: Payer: Self-pay

## 2017-04-12 NOTE — Telephone Encounter (Signed)
Needs to speak with a nurse about metFORMIN (GLUCOPHAGE) 1000 MG tablet, and atorvastatin (LIPITOR) 40 MG tablet. Please call pt back.

## 2017-04-12 NOTE — Telephone Encounter (Signed)
Called pt - no answer; left message to call us back. 

## 2017-04-17 ENCOUNTER — Ambulatory Visit: Payer: Self-pay | Admitting: Dietician

## 2017-04-17 ENCOUNTER — Ambulatory Visit: Payer: Self-pay

## 2017-04-18 ENCOUNTER — Ambulatory Visit: Payer: Self-pay | Admitting: Dietician

## 2017-04-22 ENCOUNTER — Encounter: Payer: Self-pay | Admitting: Dietician

## 2017-04-22 ENCOUNTER — Ambulatory Visit: Payer: Self-pay

## 2017-04-22 ENCOUNTER — Ambulatory Visit (INDEPENDENT_AMBULATORY_CARE_PROVIDER_SITE_OTHER): Payer: Self-pay | Admitting: Dietician

## 2017-04-22 DIAGNOSIS — Z7982 Long term (current) use of aspirin: Secondary | ICD-10-CM

## 2017-04-22 DIAGNOSIS — Z6835 Body mass index (BMI) 35.0-35.9, adult: Secondary | ICD-10-CM

## 2017-04-22 DIAGNOSIS — Z713 Dietary counseling and surveillance: Secondary | ICD-10-CM

## 2017-04-22 DIAGNOSIS — Z7984 Long term (current) use of oral hypoglycemic drugs: Secondary | ICD-10-CM

## 2017-04-22 DIAGNOSIS — Z79899 Other long term (current) drug therapy: Secondary | ICD-10-CM

## 2017-04-22 DIAGNOSIS — E119 Type 2 diabetes mellitus without complications: Secondary | ICD-10-CM

## 2017-04-22 LAB — GLUCOSE, CAPILLARY: GLUCOSE-CAPILLARY: 94 mg/dL (ref 65–99)

## 2017-04-22 NOTE — Progress Notes (Signed)
  Medical Nutrition Therapy:  Appt start time: 1108 end time: 1147 Visit # 7 Last visit 02/2017  Short term weight goal- <185  Long term weight goal ( by this summer) 155-165#)  Assessment:  Primary concerns today: meal planning for blood sugar control.  Danielle Mason is here for follow up education about meal planning for her diabetes. She is accompanied by her sister from [place]. She reports she has been "eating"  Over the holidays but is back on track now. She says she walks 45 minutes every day. We discussed foot care today, she saw a dentist last year. Her sister wants her to go over meal planning as she has observed irregular meal pattern and large portions.  Needs an eye exam- recommend referral.  ANTHROPOMETRICS: weight-197.8#, BMI-35.76, reasonable weight goal with BMI 30-32 and weight of <185# SLEEP:5-6 hours MEDICATIONS: taking metformin 1000 mg once a day because she doesn't like the size of the 1000mg  pill, HCTZ, Aspirin, denies any other supplements or vitmains BLOOD SUGAR: 97 today, last A1C 6.4%, patient did not want to repeat the A1C today DIETARY INTAKE: She Wants to defer until her next visit  Estimated energy needs: ~1200-1700 calories ~150-175 g carbohydrates ~50 g protein ~45 g fat  Progress Towards Goal(s):  Some progress.   Nutritional Diagnosis:  NB-1.1 Food and nutrition-related knowledge deficit As related to lack of prior diabetes self management training is improving  As evidenced by her questions weight loss, Improved blood sugar control and report.    Intervention:  Nutrition education and support  encouraging   Coordination of care: request per patient 500 mg metformin 2 pills twice a day  Teaching Method Utilized: Visual. Auditory. Hands on     Handouts given during visit include: plate method, AVS Barriers to learning/adherence to lifestyle change: lack of material resources Demonstrated degree of understanding via:  Teach Back    Monitoring/Evaluation:  Dietary intake, exercise, meter, and body weight in 2 week(s)  Danielle Mason, Danielle Mason, RD 04/22/2017 11:46 AM. .

## 2017-04-22 NOTE — Patient Instructions (Signed)
Blood sugar today 97 ( target range is 70-99)  Think about eating regular meals at regular times?    eat every 4-6 hours is my recommendation  Well discuss at your next visit .Marland KitchenMarland Kitchen

## 2017-04-25 ENCOUNTER — Telehealth: Payer: Self-pay | Admitting: Dietician

## 2017-04-25 DIAGNOSIS — E119 Type 2 diabetes mellitus without complications: Secondary | ICD-10-CM

## 2017-04-25 MED FILL — metFORMIN HCL 500 MG TABS: 500 | 30 days supply | Qty: 120 | Fill #0

## 2017-04-25 NOTE — Telephone Encounter (Signed)
Dr. Charlynn Grimes,  Ms. Neiswender would like to go back to the 500 mg metformin pills that are smaller.

## 2017-04-29 ENCOUNTER — Ambulatory Visit: Payer: Self-pay | Admitting: Dietician

## 2017-04-29 ENCOUNTER — Ambulatory Visit: Payer: Self-pay

## 2017-04-29 MED ORDER — METFORMIN HCL 500 MG PO TABS: 1000.0000 mg | ORAL_TABLET | Freq: Two times a day (BID) | ORAL | 2 refills | Status: DC

## 2017-04-30 ENCOUNTER — Telehealth: Payer: Self-pay | Admitting: Dietician

## 2017-04-30 NOTE — Telephone Encounter (Signed)
Call for support.

## 2017-05-01 ENCOUNTER — Encounter: Payer: Self-pay | Admitting: Dietician

## 2017-05-01 ENCOUNTER — Ambulatory Visit (INDEPENDENT_AMBULATORY_CARE_PROVIDER_SITE_OTHER): Payer: Self-pay | Admitting: Dietician

## 2017-05-01 DIAGNOSIS — E119 Type 2 diabetes mellitus without complications: Secondary | ICD-10-CM

## 2017-05-01 DIAGNOSIS — Z7984 Long term (current) use of oral hypoglycemic drugs: Secondary | ICD-10-CM

## 2017-05-01 DIAGNOSIS — Z713 Dietary counseling and surveillance: Secondary | ICD-10-CM

## 2017-05-01 LAB — GLUCOSE, CAPILLARY: GLUCOSE-CAPILLARY: 93 mg/dL (ref 65–99)

## 2017-05-01 NOTE — Patient Instructions (Signed)
What we talked about today:   Try to use In stead of dulcolox as little as possible, instead please buy Psyllium Husk.  Stir 1 tablesppon in 8 oz water and drinkg each day, increase to 2 table spoons in 8 oz water each day  Eat every 5 hours while awake. For example- if you eat at 4 am, eat again at 9 AM, 2 PM and 7 PM.   What to eat more often: fruit, vegetables, whole grains, lean protein, nuts, nut butter( peanut butter), flax seed.  Peanut butter crackers- saltine and put your own peanut butter on them   Whole grain crackers(store brand triscuits)  with peanut butter.  SUBWAY- best option is is their veggie patty on 9-Grain Wheat Bread. Calories per 6" serving: 210 calories   The plan:   Eat every 5 hours while awake. For example- if you eat at 4 am, eat again at 9 AM, 2 PM and 7 PM.   Call anytime with questions or concerns  Debera Lat Diabetes Educator [phone]

## 2017-05-01 NOTE — Progress Notes (Signed)
  Medical Nutrition Therapy:  Appt start time: 1030 end time: 1105 Visit # 8 Last visit 04/2017  Short term weight goal- <185  Long term weight goal (by this summer) 155-165# (~1.25 # per week loss) Assessment:  Primary concerns today: meal planning for blood sugar control.  Danielle Mason is here for follow up education about meal planning for her diabetes. She says she walks 45 minutes every day. .  Needs an eye exam- recommend referral.  ANTHROPOMETRICS: weight-197.6#, BMI-35.76, reasonable weight goal with BMI 30-32 and weight of <185# SLEEP:5-6 hours MEDICATIONS: taking metformin 500 mg two tablets twice a day- reports improved tolerance, HCTZ, Aspirin, atorvastatin, tylenol and dulcolax as needed BLOOD SUGAR: 93 today, last A1C 6.4%, repeat the A1C next visit DIETARY INTAKE: 4 AM- Oatmeal 5 PM- peanut butter crackers 8-9 PM Cabbage,  Fried fish, fried oysters Beverages: Water  Estimated energy needs: ~1200-1700 calories ~150-175 g carbohydrates ~50 g protein ~45 g fat  Progress Towards Goal(s):  Some progress.   Nutritional Diagnosis:  NB-1.1 Food and nutrition-related knowledge deficit As related to lack of prior diabetes self management training is gradually improving  As evidenced by her questions weight loss, Improved blood sugar control and report.    Intervention:  Nutrition and diabetes education and support  encouraging continued physical activity, weight loss, improved food choices and timing of meals Coordination of care: none at this time  Teaching Method Utilized: Visual. Auditory. Hands on     Handouts given during visit include: plate method, AVS Barriers to learning/adherence to lifestyle change: lack of material resources Demonstrated degree of understanding via:  Teach Back   Monitoring/Evaluation:  Dietary intake, exercise, A1C, and body weight in 2 week(s)  Debera Lat, RD 05/01/2017 2:03 PM. .

## 2017-05-09 ENCOUNTER — Other Ambulatory Visit: Payer: Self-pay | Admitting: Internal Medicine

## 2017-05-09 DIAGNOSIS — I10 Essential (primary) hypertension: Secondary | ICD-10-CM

## 2017-05-09 MED FILL — ATORVASTATIN 40 MG TABLET: 40 | 30 days supply | Qty: 30 | Fill #1

## 2017-05-10 MED FILL — HYDROCHLOROTHIAZIDE 25 MG T: 25 | 90 days supply | Qty: 90 | Fill #0

## 2017-05-10 NOTE — Telephone Encounter (Signed)
Would like a call back about med.

## 2017-05-10 NOTE — Telephone Encounter (Signed)
BP and DB well controlled. Pls sch PCP appt April, May, June DM / HTN F/U

## 2017-05-15 ENCOUNTER — Encounter: Payer: Self-pay | Admitting: Dietician

## 2017-05-15 ENCOUNTER — Ambulatory Visit (INDEPENDENT_AMBULATORY_CARE_PROVIDER_SITE_OTHER): Payer: Self-pay | Admitting: Dietician

## 2017-05-15 VITALS — Wt 202.0 lb

## 2017-05-15 DIAGNOSIS — Z6836 Body mass index (BMI) 36.0-36.9, adult: Secondary | ICD-10-CM

## 2017-05-15 DIAGNOSIS — Z6835 Body mass index (BMI) 35.0-35.9, adult: Secondary | ICD-10-CM

## 2017-05-15 DIAGNOSIS — Z713 Dietary counseling and surveillance: Secondary | ICD-10-CM

## 2017-05-15 DIAGNOSIS — E119 Type 2 diabetes mellitus without complications: Secondary | ICD-10-CM

## 2017-05-15 DIAGNOSIS — Z7984 Long term (current) use of oral hypoglycemic drugs: Secondary | ICD-10-CM

## 2017-05-15 LAB — POCT GLYCOSYLATED HEMOGLOBIN (HGB A1C): Hemoglobin A1C: 5.1

## 2017-05-15 LAB — GLUCOSE, CAPILLARY: Glucose-Capillary: 88 mg/dL (ref 65–99)

## 2017-05-15 NOTE — Progress Notes (Signed)
  Medical Nutrition Therapy:  Appt start time: 1245 end time: 1105 Visit # 9 Last visit 04/2017  Short term weight goal- <185  Long term weight goal (by this summer) 155-165# (~1.25 # per week loss) Assessment:  Primary concerns today: meal planning for blood sugar control.  Ms. Armas is here for follow up education about meal planning for her diabetes. She has limited time today. She could not recall what times she agreed to try to eat on a schedule She gained 4-5 pounds in the past 2 weeks. She has also been eating on the go and drinking small amounts regular soda. She says the metformin is causing her stomach to hurt and  cramp. She  wants to stop the metformin to see if that will help. She has a follow up with Dr. Charlynn Grimes in May when A1C can be repeated. Returned her sister's phone call with her in the office today; her sister wants her to be taught to use other seasoning besides salt, to check her blood sugar  With her meter and more about Portion sizes. Ms. Netzley and I agreed to go over them at a future visit when she  has enough time to discuss theses things.   ANTHROPOMETRICS: weight-200.0#, BMI-35.76 SLEEP:5-6 hours MEDICATIONS: taking metformin 500 mg two tablets twice a day, HCTZ, Aspirin, atorvastatin, tylenol and dulcolax as needed BLOOD SUGAR: 88 today, A1C 5.1% today DIETARY INTAKE:reveiwed briefly with limited time in visit today  Estimated energy needs: ~1200-1700 calories ~150-175 g carbohydrates ~50 g protein ~45 g fat  Progress Towards Goal(s):  Some progress.   Nutritional Diagnosis:  NB-1.1 Food and nutrition-related knowledge deficit As related to lack of prior diabetes self management training is gradually improving  As evidenced by her questions weight loss, Improved blood sugar control and report.    Intervention:  Nutrition and diabetes education and support  encouraging continued physical activity, weight loss, improved food choices and timing of  meals Coordination of care: discuss trial of decreased dose of metformin per Ms. Buzzelli request  Teaching Method Utilized: Visual. Auditory. Hands on     Handouts given during visit include: plate method, AVS Barriers to learning/adherence to lifestyle change: lack of material resources Demonstrated degree of understanding via:  Teach Back   Monitoring/Evaluation:  Dietary intake, exercise, A1C, and body weight in 2 week(s)  Debera Lat, RD 05/15/2017 11:09 AM. .

## 2017-05-15 NOTE — Patient Instructions (Addendum)
Times to eat something during the day are:  4 AM 9 AM 2 PM 7 PM  Please memorize these times so you know them by heart.   To decreases stomach cramping and discomfort- I will ask Dr. Charlynn Grimes about a trial of  taking 500 mg metformin two times a day   and call you if he does not agree.    If the A1C stays the same or decreases from today to May you could consider asking Dr. Charlynn Grimes about coming off it it per your request.

## 2017-05-27 ENCOUNTER — Ambulatory Visit: Payer: Self-pay

## 2017-05-29 ENCOUNTER — Ambulatory Visit: Payer: Self-pay | Admitting: Dietician

## 2017-06-04 ENCOUNTER — Telehealth: Payer: Self-pay | Admitting: Dietician

## 2017-06-11 ENCOUNTER — Encounter: Payer: Self-pay | Admitting: Dietician

## 2017-06-11 MED FILL — metFORMIN HCL 500 MG TABS: 500 | 30 days supply | Qty: 120 | Fill #1

## 2017-06-11 NOTE — Telephone Encounter (Signed)
Call to patient to follow up after missed visit. Left message requesting return call.

## 2017-06-23 ENCOUNTER — Encounter (HOSPITAL_COMMUNITY): Payer: Self-pay

## 2017-06-23 ENCOUNTER — Emergency Department (HOSPITAL_COMMUNITY)
Admission: EM | Admit: 2017-06-23 | Discharge: 2017-06-23 | Disposition: A | Discharge status: Home / Self Care | Payer: No Typology Code available for payment source | Attending: Emergency Medicine | Admitting: Emergency Medicine

## 2017-06-23 DIAGNOSIS — Y929 Unspecified place or not applicable: Secondary | ICD-10-CM | POA: Diagnosis not present

## 2017-06-23 DIAGNOSIS — Y999 Unspecified external cause status: Secondary | ICD-10-CM | POA: Insufficient documentation

## 2017-06-23 DIAGNOSIS — Z7984 Long term (current) use of oral hypoglycemic drugs: Secondary | ICD-10-CM | POA: Insufficient documentation

## 2017-06-23 DIAGNOSIS — Z79899 Other long term (current) drug therapy: Secondary | ICD-10-CM | POA: Insufficient documentation

## 2017-06-23 DIAGNOSIS — E119 Type 2 diabetes mellitus without complications: Secondary | ICD-10-CM | POA: Diagnosis not present

## 2017-06-23 DIAGNOSIS — Z7982 Long term (current) use of aspirin: Secondary | ICD-10-CM | POA: Diagnosis not present

## 2017-06-23 DIAGNOSIS — I10 Essential (primary) hypertension: Secondary | ICD-10-CM | POA: Diagnosis not present

## 2017-06-23 DIAGNOSIS — R51 Headache: Secondary | ICD-10-CM | POA: Diagnosis not present

## 2017-06-23 DIAGNOSIS — Z91040 Latex allergy status: Secondary | ICD-10-CM | POA: Diagnosis not present

## 2017-06-23 DIAGNOSIS — Y939 Activity, unspecified: Secondary | ICD-10-CM | POA: Diagnosis not present

## 2017-06-23 MED ORDER — IBUPROFEN 200 MG PO TABS
400.0000 mg | ORAL_TABLET | Freq: Once | ORAL | Status: AC
  Administered 2017-06-23: 400 mg via ORAL
  Filled 2017-06-23: qty 2

## 2017-06-23 MED ORDER — CYCLOBENZAPRINE HCL 10 MG PO TABS: 10.0000 mg | ORAL_TABLET | Freq: Two times a day (BID) | ORAL | 0 refills | Status: DC | PRN

## 2017-06-23 NOTE — Discharge Instructions (Signed)
For pain control you may take up to 800mg  of ibuprofen (that is usually 4 over the counter pills)  3 times a day (take with food)    For breakthrough pain you may take Flexeril. Do not drink alcohol, drive or operate heavy machinery when taking Flexeril.  Do not hesitate to return to the emergency room for any new, worsening or concerning symptoms.  Please obtain primary care using resource guide below. Let them know that you were seen in the emergency room and that they will need to obtain records for further outpatient management.
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
## 2017-06-23 NOTE — ED Notes (Signed)
Bed: WTR6 Expected date:  Expected time:  Means of arrival:  Comments: 

## 2017-06-26 MED FILL — CYCLOBENZAPRINE 10 MG TAB: 10 | 10 days supply | Qty: 20 | Fill #0

## 2017-07-16 MED FILL — metFORMIN HCL 500 MG TABS: 500 | 30 days supply | Qty: 120 | Fill #2

## 2017-07-16 MED FILL — ATORVASTATIN 40 MG TABLET: 40 | 30 days supply | Qty: 30 | Fill #2

## 2017-08-13 ENCOUNTER — Other Ambulatory Visit: Payer: Self-pay | Admitting: Internal Medicine

## 2017-08-13 MED FILL — metFORMIN HCL 500 MG TABS: 500 | 30 days supply | Qty: 120 | Fill #3

## 2017-08-13 MED FILL — HYDROCHLOROTHIAZIDE 25 MG T: 25 | 90 days supply | Qty: 90 | Fill #1

## 2017-08-14 MED FILL — ATORVASTATIN 40 MG TABLET: 40 | 30 days supply | Qty: 30 | Fill #0

## 2017-08-28 ENCOUNTER — Encounter: Payer: Self-pay | Admitting: Internal Medicine

## 2017-08-28 ENCOUNTER — Other Ambulatory Visit: Payer: Self-pay

## 2017-08-28 ENCOUNTER — Ambulatory Visit: Payer: Self-pay | Admitting: Internal Medicine

## 2017-08-28 VITALS — BP 125/65 | HR 83 | Temp 98.1°F | Ht 62.0 in | Wt 192.1 lb

## 2017-08-28 DIAGNOSIS — Z79899 Other long term (current) drug therapy: Secondary | ICD-10-CM

## 2017-08-28 DIAGNOSIS — Z8639 Personal history of other endocrine, nutritional and metabolic disease: Secondary | ICD-10-CM

## 2017-08-28 DIAGNOSIS — Z7984 Long term (current) use of oral hypoglycemic drugs: Secondary | ICD-10-CM

## 2017-08-28 DIAGNOSIS — Z0000 Encounter for general adult medical examination without abnormal findings: Secondary | ICD-10-CM

## 2017-08-28 DIAGNOSIS — E782 Mixed hyperlipidemia: Secondary | ICD-10-CM

## 2017-08-28 DIAGNOSIS — I10 Essential (primary) hypertension: Secondary | ICD-10-CM

## 2017-08-28 DIAGNOSIS — E785 Hyperlipidemia, unspecified: Secondary | ICD-10-CM

## 2017-08-28 DIAGNOSIS — E669 Obesity, unspecified: Secondary | ICD-10-CM

## 2017-08-28 DIAGNOSIS — Z6835 Body mass index (BMI) 35.0-35.9, adult: Secondary | ICD-10-CM

## 2017-08-28 DIAGNOSIS — E119 Type 2 diabetes mellitus without complications: Secondary | ICD-10-CM

## 2017-08-28 LAB — POCT GLYCOSYLATED HEMOGLOBIN (HGB A1C): Hemoglobin A1C: 5.2

## 2017-08-28 LAB — GLUCOSE, CAPILLARY: GLUCOSE-CAPILLARY: 85 mg/dL (ref 65–99)
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
## 2017-08-28 NOTE — Patient Instructions (Addendum)
Danielle Mason,  I am glad you are doing great. We will check some lab work today.    You can follow up with [place] in a year for re-check or earlier if you have any problems.

## 2017-08-29 LAB — BMP8+ANION GAP
ANION GAP: 16 mmol/L (ref 10.0–18.0)
BUN/Creatinine Ratio: 27 — ABNORMAL HIGH (ref 9–23)
BUN: 19 mg/dL (ref 6–24)
CALCIUM: 10 mg/dL (ref 8.7–10.2)
CHLORIDE: 102 mmol/L (ref 96–106)
CO2: 22 mmol/L (ref 20–29)
CREATININE: 0.71 mg/dL (ref 0.57–1.00)
GFR calc Af Amer: 109 mL/min/{1.73_m2} (ref >59)
GFR calc non Af Amer: 94 mL/min/{1.73_m2} (ref >59)
GLUCOSE: 85 mg/dL (ref 65–99)
Potassium: 4 mmol/L (ref 3.5–5.2)
Sodium: 140 mmol/L (ref 134–144)

## 2017-08-29 LAB — MICROALBUMIN / CREATININE URINE RATIO
CREATININE, UR: 132 mg/dL
MICROALB/CREAT RATIO: 18.4 mg/g{creat} (ref 0.0–30.0)
MICROALBUM., U, RANDOM: 24.3 ug/mL

## 2017-08-29 LAB — LIPID PANEL
CHOL/HDL RATIO: 3.4 ratio (ref 0.0–4.4)
CHOLESTEROL TOTAL: 155 mg/dL (ref 100–199)
HDL: 46 mg/dL (ref >39)
LDL CALC: 94 mg/dL (ref 0–99)
TRIGLYCERIDES: 76 mg/dL (ref 0–149)
VLDL Cholesterol Cal: 15 mg/dL (ref 5–40)

## 2017-08-30 MED ORDER — ATORVASTATIN CALCIUM 40 MG PO TABS: 40.0000 mg | ORAL_TABLET | Freq: Every day | ORAL | 2 refills | Status: DC

## 2017-08-30 MED ORDER — ATORVASTATIN CALCIUM 20 MG PO TABS: 20.0000 mg | ORAL_TABLET | Freq: Every day | ORAL | 2 refills | Status: DC

## 2017-08-30 NOTE — Assessment & Plan Note (Addendum)
She continues to work on her diet and exercise.  She has made some good progress with her weight loss BMI is now around 35 (previously had been over 40). Encouraged her to continue to work on dietary habits and exercise and congratulated her on her weight loss.

## 2017-08-30 NOTE — Addendum Note (Signed)
Addended by: Montello Lions on: 08/30/2017 08:57 AM   Modules accepted: Orders

## 2017-08-30 NOTE — Assessment & Plan Note (Signed)
Lab Results  Component Value Date   HGBA1C 5.2 08/28/2017   HGBA1C 5.1 05/15/2017   HGBA1C 6.4 01/02/2017   A1c today is 5.2.  She is currently only taking metformin 1000 mg twice daily.  She reports that she continues to exercise daily with walking.  She also notes continue to work on her diet.  She has lost another 6 pounds since her last visit in October.  She does wish to discontinue her metformin today as she  does not like taking medications.  A/p She is doing quite well and is effectively reversed her diabetes.  She wishes to discontinue her metformin and at this point I think it is a reasonable request at this point given her A1c's.  We will discontinue her metformin today.  No evidence of microalbuminuria on urine studies today.  Continue to monitor at least annually.

## 2017-08-30 NOTE — Assessment & Plan Note (Signed)
She is due for her eye exam and mammogram.  She reports she will reach out to her ophthalmologist in the breast center to schedule these visits.

## 2017-08-30 NOTE — Assessment & Plan Note (Addendum)
She is currently prescribed Lipitor 40 mg daily. She denies any adverse side effects from the medication.  On recheck cholesterol panel today she does have significant improvement in her total cholesterol 222 to 155  As well as her LDL from 159 to 94.  This is not quite a 50% response I would anticipate seeing from high intensity statin therapy.  Her ASCVD score remains elevated around 11% and would require at least moderate intensity statin therapy.  We will continue her current Lipitor 40 mg daily.

## 2017-08-30 NOTE — Assessment & Plan Note (Signed)
BP Readings from Last 3 Encounters:  08/28/17 125/65  06/23/17 132/89  03/13/17 124/72    Lab Results  Component Value Date   NA 140 08/28/2017   K 4.0 08/28/2017   CREATININE 0.71 08/28/2017   Blood pressure is well controlled at 125/65 today. She is currently prescribed hydrochlorothiazide 25 mg daily.  She reports compliance with this medication and denies any adverse side effects.  No chest pain, shortness of breath, dizziness/lightheadedness, vision changes, palpitations.  A/P We will continue her current hydrochlorothiazide dose. Bmet today is unremarkable

## 2017-09-02 ENCOUNTER — Telehealth: Payer: Self-pay | Admitting: Internal Medicine

## 2017-09-02 NOTE — Progress Notes (Signed)
Internal Medicine Clinic Attending  Case discussed with Dr. Boswell at the time of the visit.  We reviewed the resident's history and exam and pertinent patient test results.  I agree with the assessment, diagnosis, and plan of care documented in the resident's note.  

## 2017-09-02 NOTE — Telephone Encounter (Signed)
Called pt, infrmed her of results, she is very pleased

## 2017-09-02 NOTE — Telephone Encounter (Signed)
Pt would like a call back about her Lab Results on 08/28/2017.

## 2017-09-02 NOTE — Telephone Encounter (Signed)
Please let her know that her kidney function and urine test was normal and that her cholesterol has improved on her current medications. Thank you!

## 2017-09-05 ENCOUNTER — Other Ambulatory Visit: Payer: Self-pay | Admitting: Internal Medicine

## 2017-09-05 DIAGNOSIS — M25561 Pain in right knee: Secondary | ICD-10-CM

## 2017-09-05 NOTE — Telephone Encounter (Signed)
"  do not see a reason for the patient to be on this medication chronically. This was discontinued last year by Dr. Charlynn Grimes. Please have her f/u in [facility] if she has new pain that needs to be worked up " per Dr Dareen Piano.

## 2017-09-05 NOTE — Telephone Encounter (Signed)
Not on current med list.

## 2017-09-11 ENCOUNTER — Telehealth: Payer: Self-pay | Admitting: Internal Medicine

## 2017-09-11 NOTE — Telephone Encounter (Signed)
Last visit note states pt wanted to stop metformin and it is no longer on her med list. Today she calls for a new meter. It is to be under IM program. Does she need to check her blood sugar since she is on no medication for diabetes? Last A1C 5.2

## 2017-09-11 NOTE — Telephone Encounter (Signed)
Patient is requesting refill test strips, patient needs a prescription for a freestyle machine her missing up and the pharmacy said she needs to have a presription please to [facility]

## 2017-09-13 NOTE — Telephone Encounter (Signed)
I spoke w/ pt about a meter, and she has decided she doesn't need one. She does request a refill of diclofenac gel states her knees bother her often and it helps, last prescribed 01/2017. Please advise

## 2017-09-15 MED ORDER — DICLOFENAC SODIUM 1 % TD GEL: 2.0000 g | Freq: Four times a day (QID) | TRANSDERMAL | 2 refills | Status: DC

## 2017-09-24 ENCOUNTER — Ambulatory Visit: Payer: Self-pay

## 2017-10-09 ENCOUNTER — Ambulatory Visit: Payer: Self-pay

## 2017-10-09 ENCOUNTER — Encounter: Payer: Self-pay | Admitting: Internal Medicine

## 2017-10-13 ENCOUNTER — Encounter: Payer: Self-pay | Admitting: *Deleted

## 2017-10-15 ENCOUNTER — Other Ambulatory Visit: Payer: Self-pay

## 2017-10-15 ENCOUNTER — Encounter (HOSPITAL_COMMUNITY): Payer: Self-pay

## 2017-10-15 ENCOUNTER — Emergency Department (HOSPITAL_COMMUNITY): Payer: No Typology Code available for payment source

## 2017-10-15 ENCOUNTER — Emergency Department (HOSPITAL_COMMUNITY)
Admission: EM | Admit: 2017-10-15 | Discharge: 2017-10-15 | Disposition: A | Discharge status: Home / Self Care | Payer: No Typology Code available for payment source | Attending: Emergency Medicine | Admitting: Emergency Medicine

## 2017-10-15 DIAGNOSIS — Z91040 Latex allergy status: Secondary | ICD-10-CM | POA: Insufficient documentation

## 2017-10-15 DIAGNOSIS — M25572 Pain in left ankle and joints of left foot: Secondary | ICD-10-CM

## 2017-10-15 DIAGNOSIS — Z79899 Other long term (current) drug therapy: Secondary | ICD-10-CM | POA: Insufficient documentation

## 2017-10-15 DIAGNOSIS — M25512 Pain in left shoulder: Secondary | ICD-10-CM | POA: Diagnosis not present

## 2017-10-15 DIAGNOSIS — Z7982 Long term (current) use of aspirin: Secondary | ICD-10-CM | POA: Diagnosis not present

## 2017-10-15 DIAGNOSIS — M79602 Pain in left arm: Secondary | ICD-10-CM | POA: Diagnosis present

## 2017-10-15 DIAGNOSIS — E119 Type 2 diabetes mellitus without complications: Secondary | ICD-10-CM | POA: Diagnosis not present

## 2017-10-15 DIAGNOSIS — I10 Essential (primary) hypertension: Secondary | ICD-10-CM | POA: Insufficient documentation

## 2017-10-15 MED ORDER — ACETAMINOPHEN 325 MG PO TABS
650.0000 mg | ORAL_TABLET | Freq: Once | ORAL | Status: AC
  Administered 2017-10-15: 650 mg via ORAL
  Filled 2017-10-15: qty 2

## 2017-10-15 NOTE — ED Notes (Signed)
Applied arm sling
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
## 2017-10-15 NOTE — Discharge Instructions (Signed)
Please follow up with orthopedics Use the sling for comfort - you can take off for showering, sleeping, etc. Return if worsening

## 2017-10-16 ENCOUNTER — Other Ambulatory Visit: Payer: Self-pay | Admitting: Pharmacist

## 2017-10-16 ENCOUNTER — Other Ambulatory Visit: Payer: Self-pay | Admitting: Internal Medicine

## 2017-10-16 DIAGNOSIS — M25561 Pain in right knee: Secondary | ICD-10-CM

## 2017-10-16 DIAGNOSIS — E782 Mixed hyperlipidemia: Secondary | ICD-10-CM

## 2017-10-16 MED ORDER — DICLOFENAC SODIUM 1 % TD GEL: 4.0000 g | Freq: Four times a day (QID) | TRANSDERMAL | 1 refills | Status: DC

## 2017-10-16 MED FILL — DICLOFENAC SODIUM 1% GEL: 1 | 6 days supply | Qty: 100 | Fill #0

## 2017-10-16 MED FILL — metFORMIN HCL 500 MG TABS: 500 | 30 days supply | Qty: 120 | Fill #4

## 2017-10-16 MED FILL — ATORVASTATIN 40 MG TABLET: 40 | 30 days supply | Qty: 30 | Fill #1

## 2017-10-16 NOTE — Telephone Encounter (Signed)
Not on current med list; ? Discontinued in ED yesterday.

## 2017-10-21 ENCOUNTER — Ambulatory Visit (INDEPENDENT_AMBULATORY_CARE_PROVIDER_SITE_OTHER): Payer: Self-pay | Admitting: Orthopaedic Surgery

## 2017-10-21 ENCOUNTER — Encounter (INDEPENDENT_AMBULATORY_CARE_PROVIDER_SITE_OTHER): Payer: Self-pay | Admitting: Orthopaedic Surgery

## 2017-10-21 DIAGNOSIS — M25572 Pain in left ankle and joints of left foot: Secondary | ICD-10-CM

## 2017-10-21 DIAGNOSIS — M25512 Pain in left shoulder: Secondary | ICD-10-CM

## 2017-10-21 MED ORDER — CYCLOBENZAPRINE HCL 5 MG PO TABS: 5.0000 mg | ORAL_TABLET | Freq: Three times a day (TID) | ORAL | 3 refills | Status: DC | PRN

## 2017-10-21 MED ORDER — NAPROXEN 500 MG PO TABS: 500.0000 mg | ORAL_TABLET | Freq: Two times a day (BID) | ORAL | 3 refills | Status: DC

## 2017-10-21 MED FILL — CYCLOBENZAPRINE 5 MG TABLET: 5 | 10 days supply | Qty: 30 | Fill #0
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
## 2017-10-22 ENCOUNTER — Ambulatory Visit (INDEPENDENT_AMBULATORY_CARE_PROVIDER_SITE_OTHER): Payer: Self-pay | Admitting: Orthopaedic Surgery

## 2017-11-18 MED FILL — HYDROCHLOROTHIAZIDE 25 MG T: 25 | 90 days supply | Qty: 90 | Fill #2

## 2017-11-27 ENCOUNTER — Ambulatory Visit: Payer: Self-pay

## 2017-12-04 ENCOUNTER — Ambulatory Visit (INDEPENDENT_AMBULATORY_CARE_PROVIDER_SITE_OTHER): Payer: Self-pay | Admitting: Orthopaedic Surgery

## 2017-12-10 ENCOUNTER — Ambulatory Visit (INDEPENDENT_AMBULATORY_CARE_PROVIDER_SITE_OTHER): Payer: Self-pay | Admitting: Orthopaedic Surgery

## 2017-12-10 ENCOUNTER — Encounter (INDEPENDENT_AMBULATORY_CARE_PROVIDER_SITE_OTHER): Payer: Self-pay | Admitting: Orthopaedic Surgery

## 2017-12-10 DIAGNOSIS — M25512 Pain in left shoulder: Secondary | ICD-10-CM
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
## 2017-12-10 NOTE — Addendum Note (Signed)
Addended by: Precious Bard on: 12/10/2017 11:25 AM   Modules accepted: Orders

## 2017-12-20 MED FILL — CYCLOBENZAPRINE 5 MG TABLET: 5 | 10 days supply | Qty: 30 | Fill #1

## 2017-12-20 MED FILL — ATORVASTATIN 40 MG TABLET: 40 | 30 days supply | Qty: 30 | Fill #2

## 2017-12-22 ENCOUNTER — Ambulatory Visit
Admission: RE | Admit: 2017-12-22 | Discharge: 2017-12-22 | Disposition: A | Discharge status: Home / Self Care | Payer: No Typology Code available for payment source | Source: Ambulatory Visit | Attending: Orthopaedic Surgery | Admitting: Orthopaedic Surgery

## 2017-12-22 DIAGNOSIS — M25512 Pain in left shoulder: Secondary | ICD-10-CM

## 2017-12-24 ENCOUNTER — Ambulatory Visit (INDEPENDENT_AMBULATORY_CARE_PROVIDER_SITE_OTHER): Payer: Self-pay | Admitting: Orthopaedic Surgery

## 2017-12-24 ENCOUNTER — Encounter (INDEPENDENT_AMBULATORY_CARE_PROVIDER_SITE_OTHER): Payer: Self-pay | Admitting: Orthopaedic Surgery

## 2017-12-24 DIAGNOSIS — M25512 Pain in left shoulder: Secondary | ICD-10-CM

## 2017-12-24 MED ORDER — METHYLPREDNISOLONE ACETATE 40 MG/ML IJ SUSP
40.0000 mg | INTRAMUSCULAR | Status: AC | PRN
  Administered 2017-12-24: 40 mg via INTRA_ARTICULAR

## 2017-12-24 MED ORDER — LIDOCAINE HCL 1 % IJ SOLN
3.0000 mL | INTRAMUSCULAR | Status: AC | PRN
  Administered 2017-12-24: 3 mL

## 2017-12-24 MED ORDER — BUPIVACAINE HCL 0.5 % IJ SOLN
3.0000 mL | INTRAMUSCULAR | Status: AC | PRN
  Administered 2017-12-24: 3 mL via INTRA_ARTICULAR

## 2017-12-24 NOTE — Addendum Note (Signed)
Addended by: Meyer Cory on: 12/24/2017 10:16 AM   Modules accepted: Orders
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
## 2018-01-03 ENCOUNTER — Ambulatory Visit: Payer: Self-pay

## 2018-01-03 ENCOUNTER — Encounter: Payer: Self-pay | Admitting: Internal Medicine

## 2018-02-04 ENCOUNTER — Other Ambulatory Visit: Payer: Self-pay | Admitting: Internal Medicine

## 2018-02-04 ENCOUNTER — Encounter (INDEPENDENT_AMBULATORY_CARE_PROVIDER_SITE_OTHER): Payer: Self-pay | Admitting: Orthopaedic Surgery

## 2018-02-04 ENCOUNTER — Ambulatory Visit (INDEPENDENT_AMBULATORY_CARE_PROVIDER_SITE_OTHER): Payer: Self-pay | Admitting: Orthopaedic Surgery

## 2018-02-04 DIAGNOSIS — M67912 Unspecified disorder of synovium and tendon, left shoulder: Secondary | ICD-10-CM

## 2018-02-04 MED ORDER — MELOXICAM 7.5 MG PO TABS: ORAL_TABLET | ORAL | 2 refills | Status: DC

## 2018-02-04 MED FILL — DICLOFENAC SODIUM 1% GEL: 1 | 6 days supply | Qty: 100 | Fill #1

## 2018-02-04 MED FILL — MELOXICAM 7.5 MG TABLET: 7.5 | 30 days supply | Qty: 30 | Fill #0

## 2018-02-04 MED FILL — HYDROCHLOROTHIAZIDE 25 MG T: 25 | 90 days supply | Qty: 90 | Fill #3
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
## 2018-02-18 ENCOUNTER — Ambulatory Visit: Payer: Self-pay

## 2018-02-18 ENCOUNTER — Encounter: Payer: Self-pay | Admitting: Internal Medicine

## 2018-02-21 MED FILL — ATORVASTATIN 40 MG TABLET: 40 | 30 days supply | Qty: 30 | Fill #0

## 2018-02-24 ENCOUNTER — Encounter: Payer: Self-pay | Admitting: Internal Medicine

## 2018-02-24 ENCOUNTER — Other Ambulatory Visit: Payer: Self-pay

## 2018-02-24 ENCOUNTER — Ambulatory Visit (INDEPENDENT_AMBULATORY_CARE_PROVIDER_SITE_OTHER): Payer: Self-pay | Admitting: Internal Medicine

## 2018-02-24 VITALS — BP 123/71 | HR 75 | Temp 98.4°F | Ht 62.0 in | Wt 212.1 lb

## 2018-02-24 DIAGNOSIS — Z90711 Acquired absence of uterus with remaining cervical stump: Secondary | ICD-10-CM

## 2018-02-24 DIAGNOSIS — Z8639 Personal history of other endocrine, nutritional and metabolic disease: Secondary | ICD-10-CM

## 2018-02-24 DIAGNOSIS — R1030 Lower abdominal pain, unspecified: Secondary | ICD-10-CM

## 2018-02-24 DIAGNOSIS — R109 Unspecified abdominal pain: Secondary | ICD-10-CM | POA: Insufficient documentation

## 2018-02-24 HISTORY — DX: Unspecified abdominal pain: R10.9

## 2018-02-24 LAB — GLUCOSE, CAPILLARY: GLUCOSE-CAPILLARY: 96 mg/dL (ref 70–99)

## 2018-02-24 NOTE — Assessment & Plan Note (Signed)
Describes lower abdominal pain which began about one week ago. She's had two abdominal surgeries, C-sections. She is post menopausal, she had partial hyserectomy 30 years ago. The pain is worse when she bends at the hips or touches over the area. She has had no change in bowel movements, dysuria, hematuria, appetite, fever, vaginal discharge, or pain with intercourse around this time. She has been working to get herself packed for a move recently, she's also had significant increase in her weight.  Abdominal wall pain unaccompanied by other signs of abdominal or genitourinary pathology and exam are most consistent with a musculoskeletal pain.  - trial of resting, abdominal support for lifting, use of over the counter analgesics  - discussed return precautions if she develops any systemic symptoms alongside this musculoskeletal abdominal pain

## 2018-02-24 NOTE — Patient Instructions (Addendum)
Thank you for coming to the clinic today. It was a pleasure to see you.   For your abdominal pain - it seems that you may have pulled an abdominal muscle, please continue to use tylenol and nsaids for your pain. I have also attached some additional recommendations below    FOLLOW-UP INSTRUCTIONS When: 3 - 6 months with Dr. Laural Golden  For: follow up of your general health  What to bring: all of your medication bottles   Please call the internal medicine center clinic if you have any questions or concerns, we may be able to help and keep you from a long and expensive emergency room wait. Our clinic and after hours phone number is [phone], the best time to call is Monday through Friday 9 am to 4 pm but there is always someone available 24/7 if you have an emergency. If you need medication refills please notify your pharmacy one week in advance and they will send [place] a request.    RICE for Routine Care of Injuries Many injuries can be cared for using rest, ice, compression, and elevation (RICE therapy). Using RICE therapy can help to lessen pain and swelling. It can help your body to heal. Rest Reduce your normal activities and avoid using the injured part of your body. You can go back to your normal activities when you feel okay and your doctor says it is okay. Ice Do not put ice on your bare skin.  Put ice in a plastic bag.  Place a towel between your skin and the bag.  Leave the ice on for 20 minutes, 2-3 times a day.  Do this for as long as told by your doctor. Compression Compression means putting pressure on the injured area. This can be done with an elastic bandage. If an elastic bandage has been applied:  Remove and reapply the bandage every 3-4 hours or as told by your doctor.  Make sure the bandage is not wrapped too tight. Wrap the bandage more loosely if part of your body beyond the bandage is blue, swollen, cold, painful, or loses feeling (numb).  See your doctor if the  bandage seems to make your problems worse.  Elevation Elevation means keeping the injured area raised. Raise the injured area above your heart or the center of your chest if you can. When should I get help? You should get help if:  You keep having pain and swelling.  Your symptoms get worse.  Get help right away if: You should get help right away if:  You have sudden bad pain at or below the area of your injury.  You have redness or more swelling around your injury.  You have tingling or numbness at or below the injury that does not go away when you take off the bandage.  This information is not intended to replace advice given to you by your health care provider. Make sure you discuss any questions you have with your health care provider. Document Released: 09/19/2007 Document Revised: 02/28/2016 Document Reviewed: 03/10/2014 Elsevier Interactive Patient Education  2017 [employer].
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
## 2018-02-25 ENCOUNTER — Ambulatory Visit (INDEPENDENT_AMBULATORY_CARE_PROVIDER_SITE_OTHER): Payer: Self-pay | Admitting: Orthopaedic Surgery

## 2018-02-25 ENCOUNTER — Encounter (INDEPENDENT_AMBULATORY_CARE_PROVIDER_SITE_OTHER): Payer: Self-pay | Admitting: Orthopaedic Surgery

## 2018-02-25 DIAGNOSIS — M67912 Unspecified disorder of synovium and tendon, left shoulder: Secondary | ICD-10-CM

## 2018-02-25 DIAGNOSIS — Z9889 Other specified postprocedural states: Secondary | ICD-10-CM

## 2018-02-25 DIAGNOSIS — G8929 Other chronic pain: Secondary | ICD-10-CM

## 2018-02-25 DIAGNOSIS — M25512 Pain in left shoulder: Secondary | ICD-10-CM

## 2018-02-25 HISTORY — DX: Other specified postprocedural states: Z98.890
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
## 2018-02-27 NOTE — Progress Notes (Signed)
Internal Medicine Clinic Attending  Case discussed with Dr. Blum at the time of the visit.  We reviewed the resident's history and exam and pertinent patient test results.  I agree with the assessment, diagnosis, and plan of care documented in the resident's note. 

## 2018-03-14 ENCOUNTER — Other Ambulatory Visit: Payer: Self-pay

## 2018-03-14 ENCOUNTER — Encounter (HOSPITAL_COMMUNITY): Payer: Self-pay

## 2018-03-14 ENCOUNTER — Emergency Department (HOSPITAL_COMMUNITY)
Admission: EM | Admit: 2018-03-14 | Discharge: 2018-03-14 | Disposition: A | Discharge status: Home / Self Care | Payer: Self-pay | Attending: Emergency Medicine | Admitting: Emergency Medicine

## 2018-03-14 DIAGNOSIS — N390 Urinary tract infection, site not specified: Secondary | ICD-10-CM | POA: Insufficient documentation

## 2018-03-14 DIAGNOSIS — E785 Hyperlipidemia, unspecified: Secondary | ICD-10-CM | POA: Insufficient documentation

## 2018-03-14 DIAGNOSIS — Z79899 Other long term (current) drug therapy: Secondary | ICD-10-CM | POA: Insufficient documentation

## 2018-03-14 DIAGNOSIS — E119 Type 2 diabetes mellitus without complications: Secondary | ICD-10-CM | POA: Insufficient documentation

## 2018-03-14 DIAGNOSIS — I10 Essential (primary) hypertension: Secondary | ICD-10-CM | POA: Insufficient documentation

## 2018-03-14 HISTORY — DX: Type 2 diabetes mellitus without complications: E11.9

## 2018-03-14 LAB — COMPREHENSIVE METABOLIC PANEL
ALBUMIN: 3.9 g/dL (ref 3.5–5.0)
ALT: 17 U/L (ref 0–44)
AST: 16 U/L (ref 15–41)
Alkaline Phosphatase: 63 U/L (ref 38–126)
Anion gap: 6 (ref 5–15)
BUN: 20 mg/dL (ref 6–20)
CHLORIDE: 109 mmol/L (ref 98–111)
CO2: 25 mmol/L (ref 22–32)
CREATININE: 0.85 mg/dL (ref 0.44–1.00)
Calcium: 9.1 mg/dL (ref 8.9–10.3)
GFR calc Af Amer: >60 mL/min (ref >60)
GLUCOSE: 122 mg/dL — AB (ref 70–99)
POTASSIUM: 3.9 mmol/L (ref 3.5–5.1)
Sodium: 140 mmol/L (ref 135–145)
Total Bilirubin: 0.6 mg/dL (ref 0.3–1.2)
Total Protein: 7.2 g/dL (ref 6.5–8.1)

## 2018-03-14 LAB — URINALYSIS, ROUTINE W REFLEX MICROSCOPIC
BILIRUBIN URINE: NEGATIVE
Glucose, UA: NEGATIVE mg/dL
Hgb urine dipstick: NEGATIVE
KETONES UR: NEGATIVE mg/dL
Nitrite: NEGATIVE
PH: 5 (ref 5.0–8.0)
PROTEIN: NEGATIVE mg/dL
Specific Gravity, Urine: 1.023 (ref 1.005–1.030)

## 2018-03-14 LAB — CBC
HEMATOCRIT: 39.7 % (ref 36.0–46.0)
Hemoglobin: 12.5 g/dL (ref 12.0–15.0)
MCH: 28.6 pg (ref 26.0–34.0)
MCHC: 31.5 g/dL (ref 30.0–36.0)
MCV: 90.8 fL (ref 80.0–100.0)
Platelets: 302 10*3/uL (ref 150–400)
RBC: 4.37 MIL/uL (ref 3.87–5.11)
RDW: 14.2 % (ref 11.5–15.5)
WBC: 8.4 10*3/uL (ref 4.0–10.5)
nRBC: 0 % (ref 0.0–0.2)

## 2018-03-14 LAB — CBG MONITORING, ED: Glucose-Capillary: 126 mg/dL — ABNORMAL HIGH (ref 70–99)

## 2018-03-14 MED ORDER — CEPHALEXIN 500 MG PO CAPS: ORAL_CAPSULE | ORAL | 0 refills | Status: DC

## 2018-03-14 MED ORDER — CIPROFLOXACIN HCL 500 MG PO TABS: 500.0000 mg | ORAL_TABLET | Freq: Two times a day (BID) | ORAL | 0 refills | Status: DC

## 2018-03-14 MED ORDER — CEPHALEXIN 500 MG PO CAPS
500.0000 mg | ORAL_CAPSULE | Freq: Once | ORAL | Status: DC
  Filled 2018-03-14: qty 1

## 2018-03-14 MED ORDER — CIPROFLOXACIN HCL 500 MG PO TABS
500.0000 mg | ORAL_TABLET | Freq: Once | ORAL | Status: AC
  Administered 2018-03-14: 500 mg via ORAL
  Filled 2018-03-14: qty 1

## 2018-03-14 MED FILL — CIPROFLOXACIN HCL 500 MG TA: 500 | 3 days supply | Qty: 6 | Fill #0

## 2018-03-14 NOTE — Discharge Instructions (Signed)
Make sure you are getting plenty of rest and drinking a lot of fluids.  Try to eat 3 healthy meals every day.  Follow-up with your PCP next week for a repeat urine test.

## 2018-03-14 NOTE — ED Triage Notes (Signed)
Patient reports that she had confusion last night. Today the patient states at [facility] person told her she had slurring of speech, but not so in triage.
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
## 2018-03-15 LAB — URINE CULTURE

## 2018-03-16 ENCOUNTER — Telehealth (HOSPITAL_BASED_OUTPATIENT_CLINIC_OR_DEPARTMENT_OTHER): Payer: Self-pay

## 2018-03-16 ENCOUNTER — Telehealth: Payer: Self-pay

## 2018-03-16 NOTE — Progress Notes (Signed)
ED Antimicrobial Stewardship Positive Culture Follow Up   Danielle Mason is an 57 y.o. female who presented to [facility] on 03/14/2018 with a chief complaint of  Chief Complaint  Patient presents with  . Altered Mental Status    Recent Results (from the past 720 hour(s))  Urine culture     Status: Abnormal   Collection Time: 03/14/18  1:08 PM  Result Value Ref Range Status   Specimen Description   Final    URINE, CLEAN CATCH Performed at [facility], [place] [address], [place], [place] [address]    Special Requests   Final    NONE Performed at [facility], [place] [address], [place], [place] [address]    Culture (A)  Final    >=100,000 COLONIES/mL GROUP B STREP(S.AGALACTIAE)ISOLATED TESTING AGAINST S. AGALACTIAE NOT ROUTINELY PERFORMED DUE TO PREDICTABILITY OF AMP/PEN/VAN SUSCEPTIBILITY. Performed at [facility], [place] [address], [place], [place] [address]    Report Status 03/15/2018 FINAL  Final    [x]  Treated with cipro, organism resistant to prescribed antimicrobial []  Patient discharged originally without antimicrobial agent and treatment is now indicated  New antibiotic prescription: If pt with UTI symptoms, DC cipro and start cefdinir 300mg  PO BID x 5 days (*Please clarify PCN allergy and if she has had cephalosporins before. If anaphylactic reaction to PCN, will need to re-evaluate)  ED Provider: Darlin Drop, PA   Jessalynn Mccowan, Rande Lawman 03/16/2018, 10:28 AM Clinical Pharmacist Monday - Friday phone -  [phone] Saturday - Sunday phone - [phone]

## 2018-03-16 NOTE — Telephone Encounter (Signed)
Pt returned call.  Pt reports no UTI symptoms.  She stated she is feeling much Better.  Rx for Abx not needed.

## 2018-03-16 NOTE — Telephone Encounter (Signed)
Post ED Visit - Positive Culture Follow-up: Unsuccessful Patient Follow-up  Culture assessed and recommendations reviewed by:  []  Elenor Quinones, Pharm.D. []  Heide Guile, Pharm.D., BCPS AQ-ID []  Parks Neptune, Pharm.D., BCPS []  Alycia Rossetti, Pharm.D., BCPS []  [place], Pharm.D., BCPS, AAHIVP []  Legrand [place], Pharm.D., BCPS, AAHIVP []  Wynell Balloon, PharmD []  Vincenza Hews, PharmD, BCPS Apolonio Schneiders Rumbarger Pharm D Positive urine culture Symptom check may need change in abx []  Patient discharged without antimicrobial prescription and treatment is now indicated []  Organism is resistant to prescribed ED discharge antimicrobial []  Patient with positive blood cultures   Unable to contact patient after 3 attempts, letter will be sent to address on file  Genia Del 03/16/2018, 11:14 AM

## 2018-03-17 ENCOUNTER — Other Ambulatory Visit: Payer: Self-pay

## 2018-03-17 ENCOUNTER — Encounter (HOSPITAL_BASED_OUTPATIENT_CLINIC_OR_DEPARTMENT_OTHER): Payer: Self-pay | Admitting: *Deleted

## 2018-03-18 ENCOUNTER — Ambulatory Visit (INDEPENDENT_AMBULATORY_CARE_PROVIDER_SITE_OTHER): Payer: Self-pay | Admitting: Orthopaedic Surgery

## 2018-03-19 ENCOUNTER — Ambulatory Visit (HOSPITAL_BASED_OUTPATIENT_CLINIC_OR_DEPARTMENT_OTHER)
Admission: RE | Admit: 2018-03-19 | Discharge: 2018-03-19 | Disposition: A | Discharge status: Home / Self Care | Payer: Self-pay | Source: Ambulatory Visit | Attending: Orthopaedic Surgery | Admitting: Orthopaedic Surgery

## 2018-03-19 ENCOUNTER — Encounter: Payer: Self-pay | Admitting: Orthopaedic Surgery

## 2018-03-19 ENCOUNTER — Ambulatory Visit (HOSPITAL_BASED_OUTPATIENT_CLINIC_OR_DEPARTMENT_OTHER): Payer: Self-pay | Admitting: Certified Registered Nurse Anesthetist

## 2018-03-19 ENCOUNTER — Encounter (HOSPITAL_BASED_OUTPATIENT_CLINIC_OR_DEPARTMENT_OTHER): Payer: Self-pay | Admitting: *Deleted

## 2018-03-19 ENCOUNTER — Encounter (HOSPITAL_BASED_OUTPATIENT_CLINIC_OR_DEPARTMENT_OTHER)
Admission: RE | Disposition: A | Discharge status: Home / Self Care | Payer: Self-pay | Source: Ambulatory Visit | Attending: Orthopaedic Surgery

## 2018-03-19 DIAGNOSIS — E119 Type 2 diabetes mellitus without complications: Secondary | ICD-10-CM | POA: Insufficient documentation

## 2018-03-19 DIAGNOSIS — K219 Gastro-esophageal reflux disease without esophagitis: Secondary | ICD-10-CM | POA: Insufficient documentation

## 2018-03-19 DIAGNOSIS — M75102 Unspecified rotator cuff tear or rupture of left shoulder, not specified as traumatic: Secondary | ICD-10-CM

## 2018-03-19 DIAGNOSIS — M7542 Impingement syndrome of left shoulder: Secondary | ICD-10-CM

## 2018-03-19 DIAGNOSIS — M659 Synovitis and tenosynovitis, unspecified: Secondary | ICD-10-CM | POA: Insufficient documentation

## 2018-03-19 DIAGNOSIS — F1120 Opioid dependence, uncomplicated: Secondary | ICD-10-CM | POA: Insufficient documentation

## 2018-03-19 DIAGNOSIS — M7552 Bursitis of left shoulder: Secondary | ICD-10-CM | POA: Insufficient documentation

## 2018-03-19 DIAGNOSIS — I10 Essential (primary) hypertension: Secondary | ICD-10-CM | POA: Insufficient documentation

## 2018-03-19 DIAGNOSIS — Z90710 Acquired absence of both cervix and uterus: Secondary | ICD-10-CM | POA: Insufficient documentation

## 2018-03-19 DIAGNOSIS — Z823 Family history of stroke: Secondary | ICD-10-CM | POA: Insufficient documentation

## 2018-03-19 HISTORY — DX: Impingement syndrome of left shoulder: M75.42

## 2018-03-19 HISTORY — PX: SHOULDER ARTHROSCOPY WITH SUBACROMIAL DECOMPRESSION: SHX5684

## 2018-03-19 LAB — GLUCOSE, CAPILLARY: Glucose-Capillary: 126 mg/dL — ABNORMAL HIGH (ref 70–99)

## 2018-03-19 SURGERY — SHOULDER ARTHROSCOPY WITH SUBACROMIAL DECOMPRESSION
Anesthesia: General | Site: Shoulder | Laterality: Left

## 2018-03-19 MED ORDER — SUGAMMADEX SODIUM 200 MG/2ML IV SOLN
INTRAVENOUS | Status: DC | PRN
  Administered 2018-03-19: 200 mg via INTRAVENOUS

## 2018-03-19 MED ORDER — LIDOCAINE 2% (20 MG/ML) 5 ML SYRINGE
INTRAMUSCULAR | Status: DC | PRN
  Administered 2018-03-19: 40 mg via INTRAVENOUS

## 2018-03-19 MED ORDER — PHENYLEPHRINE 40 MCG/ML (10ML) SYRINGE FOR IV PUSH (FOR BLOOD PRESSURE SUPPORT)
PREFILLED_SYRINGE | INTRAVENOUS | Status: AC
  Filled 2018-03-19: qty 10

## 2018-03-19 MED ORDER — OXYCODONE HCL ER 10 MG PO T12A: 10.0000 mg | EXTENDED_RELEASE_TABLET | Freq: Two times a day (BID) | ORAL | 0 refills | Status: AC

## 2018-03-19 MED ORDER — EPINEPHRINE 30 MG/30ML IJ SOLN
INTRAMUSCULAR | Status: AC
  Filled 2018-03-19: qty 1

## 2018-03-19 MED ORDER — FENTANYL CITRATE (PF) 100 MCG/2ML IJ SOLN
50.0000 ug | INTRAMUSCULAR | Status: DC | PRN
  Administered 2018-03-19: 50 ug via INTRAVENOUS

## 2018-03-19 MED ORDER — ONDANSETRON HCL 4 MG/2ML IJ SOLN
INTRAMUSCULAR | Status: AC
  Filled 2018-03-19: qty 4

## 2018-03-19 MED ORDER — CHLORHEXIDINE GLUCONATE 4 % EX LIQD: 60.0000 mL | Freq: Once | CUTANEOUS | Status: DC

## 2018-03-19 MED ORDER — CLINDAMYCIN PHOSPHATE 600 MG/50ML IV SOLN
INTRAVENOUS | Status: AC
  Filled 2018-03-19: qty 50

## 2018-03-19 MED ORDER — LACTATED RINGERS IV SOLN
INTRAVENOUS | Status: DC
  Administered 2018-03-19 (×2): via INTRAVENOUS

## 2018-03-19 MED ORDER — LACTATED RINGERS IV SOLN: INTRAVENOUS | Status: DC

## 2018-03-19 MED ORDER — BUPIVACAINE LIPOSOME 1.3 % IJ SUSP
INTRAMUSCULAR | Status: DC | PRN
  Administered 2018-03-19: 10 mL via PERINEURAL

## 2018-03-19 MED ORDER — MIDAZOLAM HCL 2 MG/2ML IJ SOLN
INTRAMUSCULAR | Status: AC
  Filled 2018-03-19: qty 2

## 2018-03-19 MED ORDER — BUPIVACAINE-EPINEPHRINE (PF) 0.5% -1:200000 IJ SOLN
INTRAMUSCULAR | Status: DC | PRN
  Administered 2018-03-19: 20 mL via PERINEURAL

## 2018-03-19 MED ORDER — EPINEPHRINE PF 1 MG/ML IJ SOLN
INTRAMUSCULAR | Status: DC | PRN
  Administered 2018-03-19: 1 mg

## 2018-03-19 MED ORDER — LIDOCAINE 2% (20 MG/ML) 5 ML SYRINGE
INTRAMUSCULAR | Status: AC
  Filled 2018-03-19: qty 5

## 2018-03-19 MED ORDER — PHENYLEPHRINE 40 MCG/ML (10ML) SYRINGE FOR IV PUSH (FOR BLOOD PRESSURE SUPPORT)
PREFILLED_SYRINGE | INTRAVENOUS | Status: DC | PRN
  Administered 2018-03-19: 80 ug via INTRAVENOUS

## 2018-03-19 MED ORDER — OXYCODONE HCL 5 MG/5ML PO SOLN: 5.0000 mg | Freq: Once | ORAL | Status: DC | PRN

## 2018-03-19 MED ORDER — ACETAMINOPHEN 10 MG/ML IV SOLN: 1000.0000 mg | Freq: Once | INTRAVENOUS | Status: DC | PRN

## 2018-03-19 MED ORDER — PROPOFOL 10 MG/ML IV BOLUS
INTRAVENOUS | Status: DC | PRN
  Administered 2018-03-19: 30 mg via INTRAVENOUS
  Administered 2018-03-19: 170 mg via INTRAVENOUS

## 2018-03-19 MED ORDER — DEXAMETHASONE SODIUM PHOSPHATE 10 MG/ML IJ SOLN
INTRAMUSCULAR | Status: DC | PRN
  Administered 2018-03-19: 10 mg via INTRAVENOUS

## 2018-03-19 MED ORDER — PROPOFOL 10 MG/ML IV BOLUS
INTRAVENOUS | Status: AC
  Filled 2018-03-19: qty 20

## 2018-03-19 MED ORDER — CLINDAMYCIN PHOSPHATE 900 MG/50ML IV SOLN
INTRAVENOUS | Status: AC
  Filled 2018-03-19: qty 50

## 2018-03-19 MED ORDER — FENTANYL CITRATE (PF) 100 MCG/2ML IJ SOLN
INTRAMUSCULAR | Status: DC | PRN
  Administered 2018-03-19 (×2): 50 ug via INTRAVENOUS

## 2018-03-19 MED ORDER — FENTANYL CITRATE (PF) 100 MCG/2ML IJ SOLN: 25.0000 ug | INTRAMUSCULAR | Status: DC | PRN

## 2018-03-19 MED ORDER — MIDAZOLAM HCL 2 MG/2ML IJ SOLN
1.0000 mg | INTRAMUSCULAR | Status: DC | PRN
  Administered 2018-03-19 (×2): 1 mg via INTRAVENOUS

## 2018-03-19 MED ORDER — OXYCODONE-ACETAMINOPHEN 5-325 MG PO TABS: 1.0000 | ORAL_TABLET | Freq: Four times a day (QID) | ORAL | 0 refills | Status: DC | PRN

## 2018-03-19 MED ORDER — SUGAMMADEX SODIUM 200 MG/2ML IV SOLN
INTRAVENOUS | Status: AC
  Filled 2018-03-19: qty 2

## 2018-03-19 MED ORDER — ROCURONIUM BROMIDE 50 MG/5ML IV SOSY
PREFILLED_SYRINGE | INTRAVENOUS | Status: AC
  Filled 2018-03-19: qty 5

## 2018-03-19 MED ORDER — OXYCODONE HCL 5 MG PO TABS: 5.0000 mg | ORAL_TABLET | Freq: Once | ORAL | Status: DC | PRN

## 2018-03-19 MED ORDER — PROMETHAZINE HCL 25 MG/ML IJ SOLN: 6.2500 mg | INTRAMUSCULAR | Status: DC | PRN

## 2018-03-19 MED ORDER — DEXAMETHASONE SODIUM PHOSPHATE 10 MG/ML IJ SOLN
INTRAMUSCULAR | Status: AC
  Filled 2018-03-19: qty 1

## 2018-03-19 MED ORDER — FENTANYL CITRATE (PF) 100 MCG/2ML IJ SOLN
INTRAMUSCULAR | Status: AC
  Filled 2018-03-19: qty 2

## 2018-03-19 MED ORDER — ONDANSETRON HCL 4 MG PO TABS: 4.0000 mg | ORAL_TABLET | Freq: Three times a day (TID) | ORAL | 0 refills | Status: DC | PRN

## 2018-03-19 MED ORDER — SCOPOLAMINE 1 MG/3DAYS TD PT72: 1.0000 | MEDICATED_PATCH | Freq: Once | TRANSDERMAL | Status: DC | PRN

## 2018-03-19 MED ORDER — ROCURONIUM BROMIDE 50 MG/5ML IV SOSY
PREFILLED_SYRINGE | INTRAVENOUS | Status: DC | PRN
  Administered 2018-03-19: 50 mg via INTRAVENOUS

## 2018-03-19 MED ORDER — CLINDAMYCIN PHOSPHATE 900 MG/50ML IV SOLN
900.0000 mg | INTRAVENOUS | Status: AC
  Administered 2018-03-19: 900 mg via INTRAVENOUS

## 2018-03-19 MED ORDER — SODIUM CHLORIDE 0.9 % IR SOLN
Status: DC | PRN
  Administered 2018-03-19: 59 mL

## 2018-03-19 MED ORDER — ONDANSETRON HCL 4 MG/2ML IJ SOLN
INTRAMUSCULAR | Status: DC | PRN
  Administered 2018-03-19 (×2): 4 mg via INTRAVENOUS

## 2018-03-19 MED ORDER — PROMETHAZINE HCL 25 MG PO TABS: 25.0000 mg | ORAL_TABLET | Freq: Four times a day (QID) | ORAL | 1 refills | Status: DC | PRN

## 2018-03-19 MED FILL — OXYCODONE-ACETAMINOPHEN 5-3: 5-325 | 4 days supply | Qty: 30 | Fill #0

## 2018-03-19 MED FILL — PROMETHAZINE 25 MG TABLET: 25 | 7 days supply | Qty: 30 | Fill #0

## 2018-03-19 MED FILL — oxyCODONE HCL ER 10 MG T12A: 10 | 3 days supply | Qty: 6 | Fill #0

## 2018-03-19 MED FILL — ONDANSETRON HCL 4 MG TABLET: 4 | 7 days supply | Qty: 40 | Fill #0

## 2018-03-19 SURGICAL SUPPLY — 71 items
BENZOIN TINCTURE PRP APPL 2/3 (GAUZE/BANDAGES/DRESSINGS) IMPLANT
BLADE 4.2CUDA (BLADE) ×3 IMPLANT
BLADE CUTTER GATOR 3.5 (BLADE) IMPLANT
BLADE GREAT WHITE 4.2 (BLADE) IMPLANT
BLADE GREAT WHITE 4.2MM (BLADE)
BLADE SURG 15 STRL LF DISP TIS (BLADE) IMPLANT
BLADE SURG 15 STRL SS (BLADE)
BUR OVAL 4.0 (BURR) ×3 IMPLANT
CANNULA 5.75X71 LONG (CANNULA) IMPLANT
CANNULA ACUFLEX KIT 5X76 (CANNULA) ×3 IMPLANT
CANNULA TWIST IN 8.25X7CM (CANNULA) IMPLANT
CANNULA TWIST IN 8.25X9CM (CANNULA) IMPLANT
CLOSURE STERI-STRIP 1/2X4 (GAUZE/BANDAGES/DRESSINGS)
CLSR STERI-STRIP ANTIMIC 1/2X4 (GAUZE/BANDAGES/DRESSINGS) IMPLANT
COVER WAND RF STERILE (DRAPES) IMPLANT
DECANTER SPIKE VIAL GLASS SM (MISCELLANEOUS) IMPLANT
DRAPE IMP U-DRAPE 54X76 (DRAPES) ×3 IMPLANT
DRAPE INCISE IOBAN 66X45 STRL (DRAPES) ×3 IMPLANT
DRAPE STERI 35X30 U-POUCH (DRAPES) ×3 IMPLANT
DRAPE SURG 17X23 STRL (DRAPES) ×3 IMPLANT
DRAPE U-SHAPE 47X51 STRL (DRAPES) ×3 IMPLANT
DRAPE U-SHAPE 76X120 STRL (DRAPES) ×6 IMPLANT
DRSG PAD ABDOMINAL 8X10 ST (GAUZE/BANDAGES/DRESSINGS) ×6 IMPLANT
DURAPREP 26ML APPLICATOR (WOUND CARE) ×3 IMPLANT
ELECT REM PT RETURN 9FT ADLT (ELECTROSURGICAL)
ELECTRODE REM PT RTRN 9FT ADLT (ELECTROSURGICAL) IMPLANT
GAUZE SPONGE 4X4 12PLY STRL (GAUZE/BANDAGES/DRESSINGS) ×6 IMPLANT
GAUZE XEROFORM 1X8 LF (GAUZE/BANDAGES/DRESSINGS) ×3 IMPLANT
GLOVE BIOGEL PI IND STRL 7.0 (GLOVE) ×3 IMPLANT
GLOVE BIOGEL PI INDICATOR 7.0 (GLOVE) ×6
GLOVE ECLIPSE 6.5 STRL STRAW (GLOVE) IMPLANT
GLOVE ECLIPSE 7.0 STRL STRAW (GLOVE) IMPLANT
GLOVE SKINSENSE NS SZ7.5 (GLOVE) ×2
GLOVE SKINSENSE STRL SZ7.5 (GLOVE) ×1 IMPLANT
GLOVE SURG SS PI 7.0 STRL IVOR (GLOVE) ×3 IMPLANT
GLOVE SURG SYN 7.5  E (GLOVE) ×4
GLOVE SURG SYN 7.5 E (GLOVE) ×2 IMPLANT
GOWN STRL REIN XL XLG (GOWN DISPOSABLE) ×3 IMPLANT
GOWN STRL REUS W/ TWL LRG LVL3 (GOWN DISPOSABLE) ×1 IMPLANT
GOWN STRL REUS W/ TWL XL LVL3 (GOWN DISPOSABLE) ×1 IMPLANT
GOWN STRL REUS W/TWL LRG LVL3 (GOWN DISPOSABLE) ×2
GOWN STRL REUS W/TWL XL LVL3 (GOWN DISPOSABLE) ×2
IMMOBILIZER SHOULDER FOAM XLGE (SOFTGOODS) IMPLANT
IV NS IRRIG 3000ML ARTHROMATIC (IV SOLUTION) ×6 IMPLANT
KIT SHOULDER TRACTION (DRAPES) ×3 IMPLANT
LOOP 2 FIBERLINK CLOSED (SUTURE) IMPLANT
MANIFOLD NEPTUNE II (INSTRUMENTS) ×3 IMPLANT
NEEDLE SCORPION MULTI FIRE (NEEDLE) IMPLANT
NS IRRIG 1000ML POUR BTL (IV SOLUTION) ×3 IMPLANT
PACK ARTHROSCOPY DSU (CUSTOM PROCEDURE TRAY) ×3 IMPLANT
PACK BASIN DAY SURGERY FS (CUSTOM PROCEDURE TRAY) ×3 IMPLANT
PROBE BIPOLAR ATHRO 135MM 90D (MISCELLANEOUS) ×3 IMPLANT
SHEET MEDIUM DRAPE 40X70 STRL (DRAPES) ×3 IMPLANT
SLEEVE SCD COMPRESS KNEE MED (MISCELLANEOUS) ×3 IMPLANT
SLING ARM FOAM STRAP LRG (SOFTGOODS) ×3 IMPLANT
SLING ARM IMMOBILIZER LRG (SOFTGOODS) IMPLANT
SLING ARM IMMOBILIZER MED (SOFTGOODS) IMPLANT
SLING ARM MED ADULT FOAM STRAP (SOFTGOODS) IMPLANT
SLING ARM XL FOAM STRAP (SOFTGOODS) IMPLANT
SUT ETHILON 3 0 PS 1 (SUTURE) ×3 IMPLANT
SUT FIBERWIRE #2 38 T-5 BLUE (SUTURE)
SUT TIGER TAPE 7 IN WHITE (SUTURE) IMPLANT
SUTURE FIBERWR #2 38 T-5 BLUE (SUTURE) IMPLANT
SYR 50ML LL SCALE MARK (SYRINGE) ×3 IMPLANT
TAPE FIBER 2MM 7IN #2 BLUE (SUTURE) IMPLANT
TOWEL GREEN STERILE FF (TOWEL DISPOSABLE) ×3 IMPLANT
TOWEL OR NON WOVEN STRL DISP B (DISPOSABLE) IMPLANT
TUBE CONNECTING 20'X1/4 (TUBING)
TUBE CONNECTING 20X1/4 (TUBING) IMPLANT
TUBING ARTHRO INFLOW-ONLY STRL (TUBING) ×3 IMPLANT
WATER STERILE IRR 1000ML POUR (IV SOLUTION) IMPLANT

## 2018-03-19 NOTE — Discharge Instructions (Signed)
  Post-operative patient instructions  Shoulder Arthroscopy   . Ice:  Place intermittent ice or cooler pack over your shoulder, 30 minutes on and 30 minutes off.  Continue this for the first 72 hours after surgery, then save ice for use after therapy sessions or on more active days.   . Weight:  You may bear weight on your arm as your symptoms allow. . Motion:  Perform gentle shoulder motion as tolerated . Dressing:  Perform 1st dressing change at 2 days postoperative. A moderate amount of blood tinged drainage is to be expected.  So if you bleed through the dressing on the first or second day or if you have fevers, it is fine to change the dressing/check the wounds early and redress wound.  If it bleeds through again, or if the incisions are leaking frank blood, please call the office. May change dressing every 1-2 days thereafter to help watch wounds. Can purchase Tegaderm (or 3M Nexcare) water resistant dressings at local pharmacy / [facility]. . Shower:  Light shower is ok after 2 days.  Please take shower, NO bath. Recover with gauze and ace wrap to help keep wounds protected.   . Pain medication:  A narcotic pain medication has been prescribed.  Take as directed.  Typically you need narcotic pain medication more regularly during the first 3 to 5 days after surgery.  Decrease your use of the medication as the pain improves.  Narcotics can sometimes cause constipation, even after a few doses.  If you have problems with constipation, you can take an over the counter stool softener or light laxative.  If you have persistent problems, please notify your physician's office. . Physical therapy: Additional activity guidelines to be provided by your physician or physical therapist at follow-up visits.  . Driving: Do not recommend driving x 2 weeks post surgical, especially if surgery performed on right side. Should not drive while taking narcotic pain medications. It typically takes at least 2 weeks to  restore sufficient neuromuscular function for normal reaction times for driving safety.  . Call [phone] for questions or problems. Evenings you will be forwarded to the hospital operator.  Ask for the orthopaedic physician on call. Please call if you experience:    o Redness, foul smelling, or persistent drainage from the surgical site  o worsening shoulder pain and swelling not responsive to medication  o any calf pain and or swelling of the lower leg  o temperatures greater than 101.5 F o other questions or concerns   Thank you for allowing us to be a part of your care.    Post Anesthesia Home Care Instructions  Activity: Get plenty of rest for the remainder of the day. A responsible individual must stay with you for 24 hours following the procedure.  For the next 24 hours, DO NOT: -Drive a car -Operate machinery -Drink alcoholic beverages -Take any medication unless instructed by your physician -Make any legal decisions or sign important papers.  Meals: Start with liquid foods such as gelatin or soup. Progress to regular foods as tolerated. Avoid greasy, spicy, heavy foods. If nausea and/or vomiting occur, drink only clear liquids until the nausea and/or vomiting subsides. Call your physician if vomiting continues.  Special Instructions/Symptoms: Your throat may feel dry or sore from the anesthesia or the breathing tube placed in your throat during surgery. If this causes discomfort, gargle with warm salt water. The discomfort should disappear within 24 hours.  If you had a scopolamine patch   placed behind your ear for the management of post- operative nausea and/or vomiting:  1. The medication in the patch is effective for 72 hours, after which it should be removed.  Wrap patch in a tissue and discard in the trash. Wash hands thoroughly with soap and water. 2. You may remove the patch earlier than 72 hours if you experience unpleasant side effects which may include dry  mouth, dizziness or visual disturbances. 3. Avoid touching the patch. Wash your hands with soap and water after contact with the patch.    Regional Anesthesia Blocks  1. Numbness or the inability to move the "blocked" extremity may last from 3-48 hours after placement. The length of time depends on the medication injected and your individual response to the medication. If the numbness is not going away after 48 hours, call your surgeon.  2. The extremity that is blocked will need to be protected until the numbness is gone and the  Strength has returned. Because you cannot feel it, you will need to take extra care to avoid injury. Because it may be weak, you may have difficulty moving it or using it. You may not know what position it is in without looking at it while the block is in effect.  3. For blocks in the legs and feet, returning to weight bearing and walking needs to be done carefully. You will need to wait until the numbness is entirely gone and the strength has returned. You should be able to move your leg and foot normally before you try and bear weight or walk. You will need someone to be with you when you first try to ensure you do not fall and possibly risk injury.  4. Bruising and tenderness at the needle site are common side effects and will resolve in a few days.  5. Persistent numbness or new problems with movement should be communicated to the surgeon or the [facility] ([phone])/  Surgery Center ([phone]).Information for Discharge Teaching: EXPAREL (bupivacaine liposome injectable suspension)   Your surgeon or anesthesiologist gave you EXPAREL(bupivacaine) to help control your pain after surgery.   EXPAREL is a local anesthetic that provides pain relief by numbing the tissue around the surgical site.  EXPAREL is designed to release pain medication over time and can control pain for up to 72 hours.  Depending on how you respond to EXPAREL, you  may require less pain medication during your recovery.  Possible side effects:  Temporary loss of sensation or ability to move in the area where bupivacaine was injected.  Nausea, vomiting, constipation  Rarely, numbness and tingling in your mouth or lips, lightheadedness, or anxiety may occur.  Call your doctor right away if you think you may be experiencing any of these sensations, or if you have other questions regarding possible side effects.  Follow all other discharge instructions given to you by your surgeon or nurse. Eat a healthy diet and drink plenty of water or other fluids.  If you return to the hospital for any reason within 96 hours following the administration of EXPAREL, it is important for health care providers to know that you have received this anesthetic. A teal colored band has been placed on your arm with the date, time and amount of EXPAREL you have received in order to alert and inform your health care providers. Please leave this armband in place for the full 96 hours following administration, and then you may remove the band. 

## 2018-03-19 NOTE — Anesthesia Postprocedure Evaluation (Signed)
Anesthesia Post Note  Patient: Tristy Udovich  Procedure(s) Performed: LEFT SHOULDER ARTHROSCOPY WITH EXTENSIVE DEBRIDEMENT , SUBACROMIAL DECOMPRESSION (Left Shoulder)     Patient location during evaluation: PACU Anesthesia Type: General Level of consciousness: awake and alert Pain management: pain level controlled Vital Signs Assessment: post-procedure vital signs reviewed and stable Respiratory status: spontaneous breathing, nonlabored ventilation and respiratory function stable Cardiovascular status: blood pressure returned to baseline and stable Postop Assessment: no apparent nausea or vomiting Anesthetic complications: no    Last Vitals:  Vitals:   03/19/18 1115 03/19/18 1130  BP: (!) 123/104 140/84  Pulse: 92 91  Resp: 19 (!) 22  Temp:    SpO2: 99% 94%    Last Pain:  Vitals:   03/19/18 1115  TempSrc:   PainSc: 0-No pain                 Brennan Bailey

## 2018-03-19 NOTE — Anesthesia Procedure Notes (Signed)
Procedure Name: Intubation Date/Time: 03/19/2018 9:36 AM Performed by: Genelle Bal, CRNA Pre-anesthesia Checklist: Patient identified, Emergency Drugs available, Suction available and Patient being monitored Patient Re-evaluated:Patient Re-evaluated prior to induction Oxygen Delivery Method: Circle system utilized Preoxygenation: Pre-oxygenation with 100% oxygen Induction Type: IV induction Ventilation: Mask ventilation without difficulty and Oral airway inserted - appropriate to patient size Laryngoscope Size: Glidescope and 3 Grade View: Grade I Tube type: Oral Tube size: 7.0 mm Number of attempts: 1 Airway Equipment and Method: Stylet,  Oral airway and Video-laryngoscopy Placement Confirmation: ETT inserted through vocal cords under direct vision,  positive ETCO2 and breath sounds checked- equal and bilateral Secured at: 21 cm Tube secured with: Tape Dental Injury: Teeth and Oropharynx as per pre-operative assessment  Difficulty Due To: Difficulty was unanticipated, Difficult Airway- due to anterior larynx and Difficult Airway- due to large tongue Future Recommendations: Recommend- induction with short-acting agent, and alternative techniques readily available Comments: DL x1 Mil 2 by Alford Highland CRNA, grade III view, glidescope #3 x1, grade I view, ATOI w/ 7.0 OETT, +etCo2/BBS. Large tongue, stiff epiglottis, anterior VC.

## 2018-03-19 NOTE — Transfer of Care (Signed)
Immediate Anesthesia Transfer of Care Note  Patient: Danielle Mason  Procedure(s) Performed: LEFT SHOULDER ARTHROSCOPY WITH EXTENSIVE DEBRIDEMENT , SUBACROMIAL DECOMPRESSION (Left Shoulder)  Patient Location: PACU  Anesthesia Type:GA combined with regional for post-op pain  Level of Consciousness: drowsy and patient cooperative  Airway & Oxygen Therapy: Patient Spontanous Breathing and Patient connected to face mask oxygen  Post-op Assessment: Report given to RN and Post -op Vital signs reviewed and stable  Post vital signs: Reviewed and stable  Last Vitals:  Vitals Value Taken Time  BP 111/61 03/19/2018 10:46 AM  Temp    Pulse 93 03/19/2018 10:49 AM  Resp 23 03/19/2018 10:49 AM  SpO2 100 % 03/19/2018 10:49 AM  Vitals shown include unvalidated device data.  Last Pain:  Vitals:   03/19/18 0830  TempSrc: Oral  PainSc: 0-No pain         Complications: No apparent anesthesia complications

## 2018-03-19 NOTE — Op Note (Signed)
   Date of Surgery: 03/19/2018  INDICATIONS: The patient is a 57 year old female with left shoulder pain that has failed conservative treatment;  The patient did consent to the procedure after discussion of the risks and benefits.  PREOPERATIVE DIAGNOSIS:  1. Left shoulder rotator cuff tendinosis 2. Left shoulder subacromial bursitis 3. Left shoulder degenerative labrum  POSTOPERATIVE DIAGNOSIS: Same.  PROCEDURE:  1.  Arthroscopic left shoulder extensive debridement of labrum, synovitis, articular and bursal rotator cuff 2.  Arthroscopic left shoulder subacromial decompression  SURGEON: N. Eduard Roux, M.D.  ASSIST: Ciro Backer Pur[place], [place]; necessary for the timely completion of procedure and due to complexity of procedure..  ANESTHESIA:  general, regional  IV FLUIDS AND URINE: See anesthesia.  ESTIMATED BLOOD LOSS: minimal mL.  IMPLANTS: None  COMPLICATIONS: None.  DESCRIPTION OF PROCEDURE: The patient was brought to the operating room and placed supine on the operating table.  The patient had been signed prior to the procedure and this was documented. The patient had the anesthesia placed by the anesthesiologist.  A time-out was performed to confirm that this was the correct patient, site, side and location. The patient did receive antibiotics prior to the incision and was re-dosed during the procedure as needed at indicated intervals.  The patient was then moved into the lateral position with the operative extremity suspended in the fishing pole mechanism. The patient had the operative extremity prepped and draped in the standard surgical fashion.    Incisions were made for standard shoulder arthroscopy portals.  We first performed a diagnostic shoulder arthroscopy.  Did not exhibit any glenohumeral chondromalacia.  She did have mild degenerative tearing of the labrum especially the superior labrum.  When probed there was no liftoff or displacement of the superior labrum.  The  biceps itself was intact.  I felt that this did not warrant a biceps tenodesis.  We then evaluated the articular surface of the rotator cuff which did show extensive tendinosis without any full-thickness tearing.  After this was done we then repositioned the arthroscope and subacromial space.  Subacromial decompression was performed.  We then evaluated the supraspinatus and infraspinatus with a probe.  There was tendinosis particularly of the infraspinatus but I was not able to identify any full-thickness tears.  Gentle debridement of the bursal surface of the rotator cuff was performed.  Excess fluid was then removed from the shoulder joint.  Incisions were closed with interrupted nylon sutures.  Sterile dressings were applied.  Patient tolerated procedure well had no any complications.  POSTOPERATIVE PLAN: Follow-up in 1 week for suture removal and initiation of physical therapy.  Azucena Cecil, MD [place] 10:16 AM

## 2018-03-19 NOTE — Progress Notes (Signed)
Assisted Dr. Howze with left, ultrasound guided, interscalene  block. Side rails up, monitors on throughout procedure. See vital signs in flow sheet. Tolerated Procedure well. 

## 2018-03-19 NOTE — Anesthesia Procedure Notes (Signed)
Anesthesia Regional Block: Interscalene brachial plexus block   Pre-Anesthetic Checklist: ,, timeout performed, Correct Patient, Correct Site, Correct Laterality, Correct Procedure, Correct Position, site marked, Risks and benefits discussed, pre-op evaluation,  At surgeon's request and post-op pain management  Laterality: Left  Prep: Maximum Sterile Barrier Precautions used, chloraprep       Needles:  Injection technique: Single-shot  Needle Type: Echogenic Stimulator Needle     Needle Length: 4cm  Needle Gauge: 22     Additional Needles:   Procedures:,,,, ultrasound used (permanent image in chart),,,,  Narrative:  Start time: 03/19/2018 9:06 AM End time: 03/19/2018 9:10 AM Injection made incrementally with aspirations every 5 mL.  Performed by: Personally  Anesthesiologist: Brennan Bailey, MD  Additional Notes: Risks, benefits, and alternative discussed. Patient gave consent for procedure. Patient prepped and draped in sterile fashion. Sedation administered, patient remains easily responsive to voice. Relevant anatomy identified with ultrasound guidance. Local anesthetic given in 5cc increments with no signs or symptoms of intravascular injection. No pain or paraesthesias with injection. Patient monitored throughout procedure with signs of LAST or immediate complications. Tolerated well. Ultrasound image placed in chart.  Tawny Asal, MD
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
## 2018-03-19 NOTE — Anesthesia Preprocedure Evaluation (Addendum)
Anesthesia Evaluation  Patient identified by MRN, date of birth, ID band Patient awake    Reviewed: Allergy & Precautions, NPO status , Patient's Chart, lab work & pertinent test results  History of Anesthesia Complications Negative for: history of anesthetic complications  Airway Mallampati: II  TM Distance: >3 FB Neck ROM: Full    Dental  (+) Dental Advisory Given, Missing,    Pulmonary neg pulmonary ROS,    Pulmonary exam normal breath sounds clear to auscultation       Cardiovascular hypertension, Pt. on medications Normal cardiovascular exam Rhythm:Regular Rate:Normal     Neuro/Psych negative neurological ROS     GI/Hepatic Neg liver ROS, GERD  Controlled,  Endo/Other  diabetes, Well Controlled, Type 2Obese, BMI 37.9  Renal/GU negative Renal ROS     Musculoskeletal  (+) Arthritis , Osteoarthritis,  Chronic opioid use   Abdominal   Peds  Hematology negative hematology ROS (+)   Anesthesia Other Findings Day of surgery medications reviewed with the patient.  Reproductive/Obstetrics                            Anesthesia Physical Anesthesia Plan  ASA: II  Anesthesia Plan: General   Post-op Pain Management: GA combined w/ Regional for post-op pain   Induction: Intravenous  PONV Risk Score and Plan: 3 and Treatment may vary due to age or medical condition, Ondansetron, Dexamethasone and Midazolam  Airway Management Planned: Oral ETT  Additional Equipment:   Intra-op Plan:   Post-operative Plan: Extubation in OR  Informed Consent: I have reviewed the patients History and Physical, chart, labs and discussed the procedure including the risks, benefits and alternatives for the proposed anesthesia with the patient or authorized representative who has indicated his/her understanding and acceptance.   Dental advisory given  Plan Discussed with: CRNA  Anesthesia Plan Comments:         Anesthesia Quick Evaluation

## 2018-03-20 ENCOUNTER — Encounter (HOSPITAL_BASED_OUTPATIENT_CLINIC_OR_DEPARTMENT_OTHER): Payer: Self-pay | Admitting: Orthopaedic Surgery

## 2018-04-04 ENCOUNTER — Encounter (INDEPENDENT_AMBULATORY_CARE_PROVIDER_SITE_OTHER): Payer: Self-pay | Admitting: Orthopaedic Surgery

## 2018-04-04 ENCOUNTER — Ambulatory Visit (INDEPENDENT_AMBULATORY_CARE_PROVIDER_SITE_OTHER): Payer: Self-pay | Admitting: Physician Assistant

## 2018-04-04 VITALS — Ht 62.0 in | Wt 212.7 lb

## 2018-04-04 DIAGNOSIS — Z9889 Other specified postprocedural states: Secondary | ICD-10-CM

## 2018-04-04 MED ORDER — HYDROCODONE-ACETAMINOPHEN 5-325 MG PO TABS: 1.0000 | ORAL_TABLET | Freq: Three times a day (TID) | ORAL | 0 refills | Status: DC | PRN

## 2018-04-04 MED FILL — HYDROCODON-APAP 5-325: 5-325 | 8 days supply | Qty: 24 | Fill #0
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
## 2018-04-07 ENCOUNTER — Encounter (INDEPENDENT_AMBULATORY_CARE_PROVIDER_SITE_OTHER): Payer: Self-pay | Admitting: Family Medicine

## 2018-04-07 ENCOUNTER — Ambulatory Visit (INDEPENDENT_AMBULATORY_CARE_PROVIDER_SITE_OTHER): Payer: Self-pay | Admitting: Family Medicine

## 2018-04-07 DIAGNOSIS — Z9889 Other specified postprocedural states: Secondary | ICD-10-CM
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
## 2018-04-28 MED FILL — ATORVASTATIN 40 MG TABLET: 40 | 30 days supply | Qty: 30 | Fill #1

## 2018-05-02 ENCOUNTER — Encounter (INDEPENDENT_AMBULATORY_CARE_PROVIDER_SITE_OTHER): Payer: Self-pay | Admitting: Orthopaedic Surgery

## 2018-05-02 ENCOUNTER — Ambulatory Visit (INDEPENDENT_AMBULATORY_CARE_PROVIDER_SITE_OTHER): Payer: BLUE CROSS/BLUE SHIELD | Admitting: Physician Assistant

## 2018-05-02 DIAGNOSIS — Z9889 Other specified postprocedural states: Secondary | ICD-10-CM
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
## 2018-05-27 ENCOUNTER — Emergency Department (HOSPITAL_COMMUNITY)
Admission: EM | Admit: 2018-05-27 | Discharge: 2018-05-27 | Disposition: A | Discharge status: Home / Self Care | Payer: No Typology Code available for payment source | Attending: Emergency Medicine | Admitting: Emergency Medicine

## 2018-05-27 ENCOUNTER — Emergency Department (HOSPITAL_COMMUNITY): Payer: No Typology Code available for payment source

## 2018-05-27 ENCOUNTER — Other Ambulatory Visit: Payer: Self-pay

## 2018-05-27 ENCOUNTER — Encounter (HOSPITAL_COMMUNITY): Payer: Self-pay | Admitting: Emergency Medicine

## 2018-05-27 DIAGNOSIS — R51 Headache: Secondary | ICD-10-CM | POA: Diagnosis not present

## 2018-05-27 DIAGNOSIS — E119 Type 2 diabetes mellitus without complications: Secondary | ICD-10-CM | POA: Insufficient documentation

## 2018-05-27 DIAGNOSIS — Z79899 Other long term (current) drug therapy: Secondary | ICD-10-CM | POA: Diagnosis not present

## 2018-05-27 DIAGNOSIS — Z91040 Latex allergy status: Secondary | ICD-10-CM | POA: Insufficient documentation

## 2018-05-27 DIAGNOSIS — I10 Essential (primary) hypertension: Secondary | ICD-10-CM | POA: Insufficient documentation

## 2018-05-27 DIAGNOSIS — H5711 Ocular pain, right eye: Secondary | ICD-10-CM | POA: Diagnosis not present

## 2018-05-27 MED ORDER — TETRACAINE HCL 0.5 % OP SOLN
1.0000 [drp] | Freq: Once | OPHTHALMIC | Status: AC
  Administered 2018-05-27: 1 [drp] via OPHTHALMIC
  Filled 2018-05-27: qty 4

## 2018-05-27 MED ORDER — FLUORESCEIN SODIUM 1 MG OP STRP
1.0000 | ORAL_STRIP | Freq: Once | OPHTHALMIC | Status: AC
  Administered 2018-05-27: 1 via OPHTHALMIC
  Filled 2018-05-27: qty 1
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
## 2018-05-27 NOTE — ED Notes (Signed)
Patient transported to CT 

## 2018-05-27 NOTE — Discharge Instructions (Signed)
Can take tylenol/motrin for headache. Follow-up with your eye doctor on Monday as scheduled for re-check. Return here for any new/acute changes.
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
## 2018-05-28 ENCOUNTER — Encounter: Payer: Self-pay | Admitting: Internal Medicine

## 2018-05-28 ENCOUNTER — Ambulatory Visit (INDEPENDENT_AMBULATORY_CARE_PROVIDER_SITE_OTHER): Payer: No Typology Code available for payment source | Admitting: Internal Medicine

## 2018-05-28 ENCOUNTER — Other Ambulatory Visit: Payer: Self-pay | Admitting: Internal Medicine

## 2018-05-28 VITALS — BP 135/85 | HR 103 | Temp 99.5°F | Ht 62.0 in | Wt 215.2 lb

## 2018-05-28 DIAGNOSIS — H538 Other visual disturbances: Secondary | ICD-10-CM

## 2018-05-28 DIAGNOSIS — R058 Other specified cough: Secondary | ICD-10-CM | POA: Insufficient documentation

## 2018-05-28 DIAGNOSIS — G8911 Acute pain due to trauma: Secondary | ICD-10-CM

## 2018-05-28 DIAGNOSIS — R05 Cough: Secondary | ICD-10-CM | POA: Insufficient documentation

## 2018-05-28 DIAGNOSIS — H5711 Ocular pain, right eye: Secondary | ICD-10-CM | POA: Diagnosis not present

## 2018-05-28 DIAGNOSIS — I10 Essential (primary) hypertension: Secondary | ICD-10-CM

## 2018-05-28 MED ORDER — HYDROCHLOROTHIAZIDE 25 MG PO TABS: 25.0000 mg | ORAL_TABLET | Freq: Every morning | ORAL | 3 refills | Status: DC

## 2018-05-28 MED ORDER — GUAIFENESIN-CODEINE 100-10 MG/5ML PO SOLN: 5.0000 mL | Freq: Four times a day (QID) | ORAL | 0 refills | Status: DC | PRN

## 2018-05-28 MED FILL — HYDROCHLOROTHIAZIDE 25 MG T: 25 | 90 days supply | Qty: 90 | Fill #0

## 2018-05-28 MED FILL — GUAIATUSSIN AC LIQUID: 100-10 | 6 days supply | Qty: 118 | Fill #0
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
## 2018-05-28 NOTE — Assessment & Plan Note (Signed)
Ms. Rock Nephew reports an ongoing dry cough after recent URI 1.5 weeks ago.  She denies fevers, chills, sore throat, headache, congestion.  States the cough does not let her sleep at night and is requesting something for it.  On exam her lungs are clear to auscultation bilaterally and oropharynx is clear.  Ordered Robitussin every 6 hours PRN.

## 2018-05-28 NOTE — Patient Instructions (Signed)
Ms. Moretto,   Please make sure to follow-up with your eye doctor on Monday.  He will let you know if you are able to return to work then.  For your cough I sent a prescription for Robitussin to your pharmacy.  You can use this every 6 hours as needed for cough.  Call if you have any questions or concerns.  - Dr. Frederico Hamman

## 2018-05-28 NOTE — Progress Notes (Signed)
Internal Medicine Clinic Attending  Case discussed with Dr. Santos-Sanchez at the time of the visit.  We reviewed the resident's history and exam and pertinent patient test results.  I agree with the assessment, diagnosis, and plan of care documented in the resident's note.    
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
## 2018-06-05 MED FILL — tiZANidine HCL 4 MG TABS: 4 | 10 days supply | Qty: 30 | Fill #0

## 2018-06-05 MED FILL — PARoxetine HCL 20 MG TABS: 20 | 30 days supply | Qty: 30 | Fill #0

## 2018-06-05 MED FILL — NABUMETONE 750 MG TABS: 750 | 10 days supply | Qty: 20 | Fill #0

## 2018-06-16 ENCOUNTER — Encounter: Payer: Self-pay | Admitting: Internal Medicine

## 2018-06-16 ENCOUNTER — Ambulatory Visit (INDEPENDENT_AMBULATORY_CARE_PROVIDER_SITE_OTHER): Payer: No Typology Code available for payment source | Admitting: Internal Medicine

## 2018-06-16 ENCOUNTER — Other Ambulatory Visit: Payer: Self-pay

## 2018-06-16 VITALS — BP 117/70 | HR 89 | Temp 98.2°F | Ht 62.0 in | Wt 216.8 lb

## 2018-06-16 DIAGNOSIS — R51 Headache: Secondary | ICD-10-CM | POA: Diagnosis not present

## 2018-06-16 DIAGNOSIS — R42 Dizziness and giddiness: Secondary | ICD-10-CM

## 2018-06-16 DIAGNOSIS — H5711 Ocular pain, right eye: Secondary | ICD-10-CM | POA: Diagnosis not present

## 2018-06-16 DIAGNOSIS — Z79899 Other long term (current) drug therapy: Secondary | ICD-10-CM

## 2018-06-16 DIAGNOSIS — F430 Acute stress reaction: Secondary | ICD-10-CM

## 2018-06-16 DIAGNOSIS — R12 Heartburn: Secondary | ICD-10-CM

## 2018-06-16 DIAGNOSIS — G8911 Acute pain due to trauma: Secondary | ICD-10-CM | POA: Diagnosis not present

## 2018-06-16 MED ORDER — DICLOFENAC SODIUM 1 % TD GEL: 4.0000 g | Freq: Four times a day (QID) | TRANSDERMAL | 0 refills | Status: DC | PRN

## 2018-06-16 MED ORDER — CYCLOBENZAPRINE HCL 5 MG PO TABS: 5.0000 mg | ORAL_TABLET | Freq: Three times a day (TID) | ORAL | 0 refills | Status: AC | PRN

## 2018-06-16 MED FILL — CYCLOBENZAPRINE 5 MG TABLET: 5 | 14 days supply | Qty: 42 | Fill #0
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
## 2018-06-16 NOTE — Patient Instructions (Addendum)
It was a pleasure to see you today Danielle Mason. I am sorry that you to hear about your headache and right facial pain.   -Please start using flexeril 5mg  three times daily as needed -Please apply voltaren gel to your upper back and use heat on your face, neck, and upper back for relief  -Follow up in 2 weeks if symptoms are not alleviated  -Our behavioral health specialist Ms. Dessie Coma will speak with you   If you have any questions or concerns, please call our clinic at [phone] between 9am-5pm and after hours call [phone] and ask for the internal medicine resident on call. If you feel you are having a medical emergency please call 911.   Thank you, we look forward to help you remain healthy!  Lars Mage, MD Internal Medicine PGY2

## 2018-06-17 DIAGNOSIS — F430 Acute stress reaction: Secondary | ICD-10-CM | POA: Insufficient documentation
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
## 2018-06-17 NOTE — Addendum Note (Signed)
Addended by: Lars Mage on: 06/17/2018 05:01 AM   Modules accepted: Orders

## 2018-06-18 ENCOUNTER — Telehealth: Payer: Self-pay | Admitting: *Deleted

## 2018-06-18 NOTE — Telephone Encounter (Signed)
Information was sent to CoverMYMeds/ BCBC for PA for Diclofenac Gel1%.  Awaiting determination within 3 business days.  [phone].  Sander Nephew, RN 06/18/2018 12:21 PM.

## 2018-06-19 ENCOUNTER — Other Ambulatory Visit: Payer: Self-pay | Admitting: Emergency Medicine

## 2018-06-19 DIAGNOSIS — S1093XA Contusion of unspecified part of neck, initial encounter: Secondary | ICD-10-CM

## 2018-06-19 DIAGNOSIS — S060X0A Concussion without loss of consciousness, initial encounter: Secondary | ICD-10-CM

## 2018-06-19 DIAGNOSIS — S0511XA Contusion of eyeball and orbital tissues, right eye, initial encounter: Secondary | ICD-10-CM

## 2018-06-24 ENCOUNTER — Other Ambulatory Visit: Payer: Self-pay

## 2018-06-24 ENCOUNTER — Telehealth: Payer: Self-pay | Admitting: Licensed Clinical Social Worker

## 2018-06-24 NOTE — Telephone Encounter (Signed)
Patient was contacted due to a recent referral from her PCP. Patient did not answer, and a voicemail was left for the patient.

## 2018-07-02 ENCOUNTER — Encounter: Payer: Self-pay | Admitting: Internal Medicine

## 2018-07-11 NOTE — Progress Notes (Signed)
Internal Medicine Clinic Attending  Case discussed with Dr. Chundi at the time of the visit.  We reviewed the resident's history and exam and pertinent patient test results.  I agree with the assessment, diagnosis, and plan of care documented in the resident's note. 

## 2018-07-15 ENCOUNTER — Other Ambulatory Visit: Payer: Self-pay | Admitting: Internal Medicine

## 2018-07-15 DIAGNOSIS — E782 Mixed hyperlipidemia: Secondary | ICD-10-CM

## 2018-07-15 MED ORDER — ATORVASTATIN CALCIUM 40 MG PO TABS: 40.0000 mg | ORAL_TABLET | Freq: Every day | ORAL | 1 refills | Status: DC

## 2018-07-15 MED FILL — ATORVASTATIN 40 MG TABLET: 40 | 90 days supply | Qty: 90 | Fill #0

## 2018-07-15 NOTE — Progress Notes (Signed)
Pls let her know I refilled her atorvastatin as to run out soon. Sent to [facility]. pls verify name and dose of med with pt

## 2018-07-15 NOTE — Progress Notes (Signed)
Pt called / informed of Atorvastatin 40mg  refill, sent to [facility]. Explained [facility] has closed and [facility] will send refill via mail. Pt stated she understands.

## 2018-07-29 ENCOUNTER — Telehealth: Payer: Self-pay | Admitting: Licensed Clinical Social Worker

## 2018-07-29 NOTE — Telephone Encounter (Signed)
Patient was contacted and scheduled for an appointment on 08/06/18 @ 11:00.

## 2018-08-06 ENCOUNTER — Ambulatory Visit: Payer: Self-pay | Admitting: Licensed Clinical Social Worker

## 2018-08-06 ENCOUNTER — Encounter: Payer: Self-pay | Admitting: Licensed Clinical Social Worker
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
## 2018-08-13 ENCOUNTER — Ambulatory Visit: Payer: Self-pay | Admitting: Licensed Clinical Social Worker

## 2018-08-13 ENCOUNTER — Telehealth: Payer: Self-pay | Admitting: Licensed Clinical Social Worker

## 2018-08-13 NOTE — Telephone Encounter (Signed)
Patient was contacted twice for her scheduled appointment. Patient did not answer, and a voicemail was left for the patient.

## 2018-09-03 ENCOUNTER — Telehealth: Payer: Self-pay | Admitting: Licensed Clinical Social Worker

## 2018-09-03 ENCOUNTER — Encounter: Payer: Self-pay | Admitting: Internal Medicine

## 2018-09-03 ENCOUNTER — Other Ambulatory Visit: Payer: Self-pay

## 2018-09-03 ENCOUNTER — Ambulatory Visit (INDEPENDENT_AMBULATORY_CARE_PROVIDER_SITE_OTHER): Payer: BLUE CROSS/BLUE SHIELD | Admitting: Internal Medicine

## 2018-09-03 VITALS — BP 131/64 | HR 103 | Temp 97.9°F | Ht 62.0 in | Wt 223.8 lb

## 2018-09-03 DIAGNOSIS — R7303 Prediabetes: Secondary | ICD-10-CM

## 2018-09-03 DIAGNOSIS — M7989 Other specified soft tissue disorders: Secondary | ICD-10-CM

## 2018-09-03 DIAGNOSIS — R2231 Localized swelling, mass and lump, right upper limb: Secondary | ICD-10-CM | POA: Insufficient documentation

## 2018-09-03 DIAGNOSIS — Z1231 Encounter for screening mammogram for malignant neoplasm of breast: Secondary | ICD-10-CM

## 2018-09-03 LAB — POCT GLYCOSYLATED HEMOGLOBIN (HGB A1C): Hemoglobin A1C: 6.3 % — AB (ref 4.0–5.6)

## 2018-09-03 LAB — GLUCOSE, CAPILLARY: Glucose-Capillary: 169 mg/dL — ABNORMAL HIGH (ref 70–99)

## 2018-09-03 NOTE — Assessment & Plan Note (Signed)
Patient reports right hand swelling that has been going on for the past few months.  The swelling is localized to the dorsum of her right hand near her third and fourth digits.  She denies any pain but does endorse hand stiffness.  She is says the swelling is worse in the mornings as well as the stiffness and improves throughout the day.  She has not tried any over-the-counter remedies.  She states she is on her right hand and is right-handed.  On exam she has normal range of motion no sensory deficits.  Nonpitting swelling, very mild.  This is most likely due to the arthritic changes and would recommend NSAID use at this time.  Plan: - Use Tylenol as needed and can try ice or heating packs for swelling

## 2018-09-03 NOTE — Assessment & Plan Note (Signed)
Hgb A1c 6.3. Blood sugar 169.  Patient reports at home she checks her blood sugars and they are averaging around 120-140.  She states that she stopped taking metformin about a year ago because she did not like how she felt on it.  However she has adjusted his significant lifestyle modifications.  She walks a lot and controls her diet.  We will continue to monitor her A1c and blood sugars.  At this time there is no need for any medications.
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
## 2018-09-03 NOTE — Patient Instructions (Signed)
Danielle Mason,  It was a pleasure meeting you! We discussed your right hand swelling and your hemoglobin A1c.   For your hand swelling I recommend trying ice or heat and tylenol. It is most likely due to arthritic changes.  I am very proud of your lifestyle modifications that have brought your blood sugar levels and hemoglobin A1c level down! Keep up the good work!   We can plan to follow up in 6 months or sooner if anything comes up! Thanks, Dr. Laural Golden

## 2018-09-03 NOTE — Telephone Encounter (Signed)
Patient was contacted due to missing her appointment. Patient did not answer, and a voicemail was left for the patient to contact our office if she would like to reschedule.

## 2018-09-04 ENCOUNTER — Other Ambulatory Visit: Payer: Self-pay | Admitting: Internal Medicine

## 2018-09-04 DIAGNOSIS — E782 Mixed hyperlipidemia: Secondary | ICD-10-CM

## 2018-09-04 DIAGNOSIS — H5711 Ocular pain, right eye: Secondary | ICD-10-CM

## 2018-09-04 LAB — MICROALBUMIN / CREATININE URINE RATIO
Creatinine, Urine: 136.5 mg/dL
Microalb/Creat Ratio: 16 mg/g creat (ref 0–29)
Microalbumin, Urine: 21.6 ug/mL

## 2018-09-04 NOTE — Progress Notes (Signed)
Internal Medicine Clinic Attending  Case discussed with Dr. Rehman at the time of the visit.  We reviewed the resident's history and exam and pertinent patient test results.  I agree with the assessment, diagnosis, and plan of care documented in the resident's note.  

## 2018-09-04 NOTE — Telephone Encounter (Signed)
Needs refills on hydrochlorothiazide (HYDRODIURIL) 25 MG tablet diclofenac sodium (VOLTAREN) 1 % GEL atorvastatin (LIPITOR) 40 MG tablet  ;pt contact [place], [place] - [address] [facility]

## 2018-09-04 NOTE — Telephone Encounter (Signed)
Pt has refills on HCTZ.

## 2018-09-05 MED ORDER — ATORVASTATIN CALCIUM 40 MG PO TABS: 40.0000 mg | ORAL_TABLET | Freq: Every day | ORAL | 1 refills | Status: DC

## 2018-09-05 MED ORDER — DICLOFENAC SODIUM 1 % TD GEL: 4.0000 g | Freq: Four times a day (QID) | TRANSDERMAL | 0 refills | Status: DC | PRN

## 2018-09-12 MED FILL — HYDROCHLOROTHIAZIDE 25 MG T: 25 | 90 days supply | Qty: 90 | Fill #1

## 2018-09-15 ENCOUNTER — Telehealth: Payer: Self-pay | Admitting: Internal Medicine

## 2018-09-15 NOTE — Telephone Encounter (Signed)
Pt requesting Test Results.

## 2018-09-16 NOTE — Telephone Encounter (Signed)
There are no new test results since patient's last office visit. She was informed of her Hgb A1c and other labs at that time. Is there anything in particular she is requesting? Currently on nights, but I will call her tomorrow afternoon

## 2018-10-01 ENCOUNTER — Telehealth: Payer: Self-pay | Admitting: *Deleted

## 2018-10-01 ENCOUNTER — Other Ambulatory Visit: Payer: Self-pay

## 2018-10-01 ENCOUNTER — Ambulatory Visit (INDEPENDENT_AMBULATORY_CARE_PROVIDER_SITE_OTHER): Payer: BLUE CROSS/BLUE SHIELD | Admitting: Internal Medicine

## 2018-10-01 DIAGNOSIS — M25579 Pain in unspecified ankle and joints of unspecified foot: Secondary | ICD-10-CM | POA: Insufficient documentation

## 2018-10-01 DIAGNOSIS — M25571 Pain in right ankle and joints of right foot: Secondary | ICD-10-CM

## 2018-10-01 DIAGNOSIS — M79674 Pain in right toe(s): Secondary | ICD-10-CM

## 2018-10-01 DIAGNOSIS — H5711 Ocular pain, right eye: Secondary | ICD-10-CM

## 2018-10-01 MED ORDER — DICLOFENAC SODIUM 1 % TD GEL: 4.0000 g | Freq: Four times a day (QID) | TRANSDERMAL | 0 refills | Status: DC | PRN

## 2018-10-01 NOTE — Assessment & Plan Note (Signed)
1 week history of second toe pain on right foot. Likely acute inflammation in the setting of starting a walk exercise program after being off for several months. Exam notable for point tenderness at base of second MTP joint. Will treat with NSAIDs and ice. Encouraged her to wear comfortable shoes with good support when she goes walking.

## 2018-10-01 NOTE — Telephone Encounter (Signed)
Pt states she would like a refill of ibuprofen 600mg  1 tab every 8 hrs #90 like she used to get.are you agreeable?
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
## 2018-10-01 NOTE — Patient Instructions (Signed)
Danielle Mason, It was a pleasure meeting you!  Today we discussed your toe pain. You likely have some inflammation after not being used to walking as much. You can use Ibuprofen and ice as needed to help with the pain, and I'd encourage you to continue using comfortable shoes when you walk.   I checked your urine results, and they were normal.   Please call with any questions or concerns. Otherwise, plan to see your PCP in about 2 months for follow-up  Take care! Dr. Koleen Distance

## 2018-10-02 ENCOUNTER — Other Ambulatory Visit: Payer: Self-pay | Admitting: Internal Medicine

## 2018-10-02 DIAGNOSIS — H5711 Ocular pain, right eye: Secondary | ICD-10-CM

## 2018-10-02 MED ORDER — DICLOFENAC SODIUM 1 % TD GEL: 4.0000 g | Freq: Four times a day (QID) | TRANSDERMAL | 2 refills | Status: DC | PRN

## 2018-10-02 MED ORDER — IBUPROFEN 600 MG PO TABS: 600.0000 mg | ORAL_TABLET | Freq: Three times a day (TID) | ORAL | 0 refills | Status: DC | PRN

## 2018-10-02 MED FILL — DICLOFENAC SODIUM 1 % GEL: 1 | 13 days supply | Qty: 100 | Fill #0

## 2018-10-02 MED FILL — IBUPROFEN 600 MG TABLET: 600 | 30 days supply | Qty: 90 | Fill #0

## 2018-10-02 NOTE — Progress Notes (Signed)
For foot pain. Cr stable Knows not to take IBU on empty stomach. Uses IBU PRN and doesn't take daily

## 2018-10-06 NOTE — Progress Notes (Signed)
Internal Medicine Clinic Attending  Case discussed with Dr. Bloomfield at the time of the visit.  We reviewed the resident's history and exam and pertinent patient test results.  I agree with the assessment, diagnosis, and plan of care documented in the resident's note.  

## 2018-10-08 NOTE — Telephone Encounter (Signed)
I did it on the 18th

## 2018-10-12 ENCOUNTER — Encounter: Payer: Self-pay | Admitting: *Deleted

## 2018-11-07 ENCOUNTER — Other Ambulatory Visit: Payer: Self-pay | Admitting: Internal Medicine

## 2018-12-03 ENCOUNTER — Encounter: Payer: Self-pay | Admitting: Internal Medicine

## 2018-12-03 ENCOUNTER — Ambulatory Visit (INDEPENDENT_AMBULATORY_CARE_PROVIDER_SITE_OTHER): Payer: BLUE CROSS/BLUE SHIELD | Admitting: Internal Medicine

## 2018-12-03 ENCOUNTER — Other Ambulatory Visit: Payer: Self-pay

## 2018-12-03 ENCOUNTER — Ambulatory Visit (HOSPITAL_COMMUNITY)
Admission: RE | Admit: 2018-12-03 | Discharge: 2018-12-03 | Disposition: A | Discharge status: Home / Self Care | Payer: BLUE CROSS/BLUE SHIELD | Source: Ambulatory Visit | Attending: Internal Medicine | Admitting: Internal Medicine

## 2018-12-03 VITALS — BP 139/82 | HR 90 | Temp 98.4°F | Wt 225.2 lb

## 2018-12-03 DIAGNOSIS — R7303 Prediabetes: Secondary | ICD-10-CM | POA: Diagnosis not present

## 2018-12-03 DIAGNOSIS — I10 Essential (primary) hypertension: Secondary | ICD-10-CM | POA: Diagnosis not present

## 2018-12-03 DIAGNOSIS — Z1159 Encounter for screening for other viral diseases: Secondary | ICD-10-CM

## 2018-12-03 DIAGNOSIS — Z79899 Other long term (current) drug therapy: Secondary | ICD-10-CM | POA: Diagnosis not present

## 2018-12-03 DIAGNOSIS — R0789 Other chest pain: Secondary | ICD-10-CM

## 2018-12-03 DIAGNOSIS — K219 Gastro-esophageal reflux disease without esophagitis: Secondary | ICD-10-CM

## 2018-12-03 DIAGNOSIS — Z8639 Personal history of other endocrine, nutritional and metabolic disease: Secondary | ICD-10-CM

## 2018-12-03 DIAGNOSIS — Z0000 Encounter for general adult medical examination without abnormal findings: Secondary | ICD-10-CM

## 2018-12-03 DIAGNOSIS — R7309 Other abnormal glucose: Secondary | ICD-10-CM | POA: Diagnosis not present

## 2018-12-03 LAB — POCT GLYCOSYLATED HEMOGLOBIN (HGB A1C): Hemoglobin A1C: 6.9 % — AB (ref 4.0–5.6)

## 2018-12-03 LAB — GLUCOSE, CAPILLARY: Glucose-Capillary: 126 mg/dL — ABNORMAL HIGH (ref 70–99)

## 2018-12-03 NOTE — Assessment & Plan Note (Addendum)
Two episodes of central chest pain after eating dinner of hot wings. She states the pain started when she went to lie down shortly after dinner. No exertional chest pain, dyspnea with or without exertion, nausea, or neck, back, or arm pain. She has a history of heart burn for which she used to take PPI but decided to stop taking it several years ago as she had been on it for a long time and no longer felt it was needed. ECG done which showed NSR. There may also be a component of muscle soreness as she is moving and did have some mild tenderness to palpation.   - discussed strategies to prevent heartburn with diet and timing as she would like to control this with diet initially  - TUMs or pepcid prn with follow-up if she has continued recurrence over the next month

## 2018-12-03 NOTE — Assessment & Plan Note (Addendum)
Last a1c 6.3. Previously on metformin but self-discontinued as she did not like the way it made her feel and has been controlled with diet at home. A1c today 6.9. Checks sugar at home once per week prior to a meal, which varies to around 135-190.  She has had about 10 lb weight gain since November 2019. Prior to covid she was walking 45 minutes per day. She is moving into a new house and after she gets settled she plans to start walking again.  - encouraged weight loss, provided resources for diabetic diet  - f/u three months for a1c  - continue atorvastatin

## 2018-12-03 NOTE — Patient Instructions (Addendum)
Thank you for allowing [place] to provide your care today. Today we discussed your hypertension, reflux and prediabetes.     I have ordered the following labs for you:  Basic metabolic panel    I will call if any are abnormal.    You can take Tums as directed for your symptoms of heart burn. If this does not help please schedule a follow-up appointment.   Please follow-up in three months for repeat hemoglobin a1c check.    Please call the internal medicine center clinic if you have any questions or concerns, we may be able to help and keep you from a long and expensive emergency room wait. Our clinic and after hours phone number is [phone], the best time to call is Monday through Friday 9 am to 4 pm but there is always someone available 24/7 if you have an emergency. If you need medication refills please notify your pharmacy one week in advance and they will send [place] a request.

## 2018-12-03 NOTE — Assessment & Plan Note (Signed)
-   hepatitis C screening today

## 2018-12-03 NOTE — Assessment & Plan Note (Signed)
BP Readings from Last 3 Encounters:  12/03/18 139/82  10/01/18 125/82  09/03/18 131/64   She is on HCTZ 25 mg qd and endorses taking this medication daily. Blood pressure currently well controlled. She has had about 10 lb weight gain. Prior to covid she was walking 45 minutes per day but has not been exercising or eating as well. She is moving into a new house and after she gets settled she plans to start walking again.   - BMP today - continue HCTZ 25 mg qd
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
## 2018-12-04 LAB — HEPATITIS C ANTIBODY: Hep C Virus Ab: 0.2 s/co ratio (ref 0.0–0.9)

## 2018-12-04 LAB — BMP8+ANION GAP
Anion Gap: 19 mmol/L — ABNORMAL HIGH (ref 10.0–18.0)
BUN/Creatinine Ratio: 19 (ref 12–28)
BUN: 15 mg/dL (ref 8–27)
CO2: 21 mmol/L (ref 20–29)
Calcium: 9.9 mg/dL (ref 8.7–10.3)
Chloride: 100 mmol/L (ref 96–106)
Creatinine, Ser: 0.81 mg/dL (ref 0.57–1.00)
GFR calc Af Amer: 91 mL/min/{1.73_m2} (ref >59)
GFR calc non Af Amer: 79 mL/min/{1.73_m2} (ref >59)
Glucose: 127 mg/dL — ABNORMAL HIGH (ref 65–99)
Potassium: 4.3 mmol/L (ref 3.5–5.2)
Sodium: 140 mmol/L (ref 134–144)

## 2018-12-04 NOTE — Progress Notes (Signed)
Internal Medicine Clinic Attending  Case discussed with Dr. Seawell at the time of the visit.  We reviewed the resident's history and exam and pertinent patient test results.  I agree with the assessment, diagnosis, and plan of care documented in the resident's note.    

## 2018-12-08 ENCOUNTER — Encounter: Payer: Self-pay | Admitting: Internal Medicine

## 2018-12-08 ENCOUNTER — Ambulatory Visit (INDEPENDENT_AMBULATORY_CARE_PROVIDER_SITE_OTHER): Payer: BLUE CROSS/BLUE SHIELD | Admitting: Internal Medicine

## 2018-12-08 ENCOUNTER — Other Ambulatory Visit: Payer: Self-pay

## 2018-12-08 DIAGNOSIS — M25512 Pain in left shoulder: Secondary | ICD-10-CM

## 2018-12-08 DIAGNOSIS — R0789 Other chest pain: Secondary | ICD-10-CM

## 2018-12-08 HISTORY — DX: Other chest pain: R07.89

## 2018-12-08 MED FILL — DICLOFENAC SODIUM 1 % GEL: 1 | 13 days supply | Qty: 100 | Fill #1

## 2018-12-08 MED FILL — HYDROCHLOROTHIAZIDE 25 MG T: 25 | 90 days supply | Qty: 90 | Fill #2

## 2018-12-08 MED FILL — ATORVASTATIN 40 MG TABLET: 40 | 90 days supply | Qty: 90 | Fill #0

## 2018-12-08 NOTE — Assessment & Plan Note (Signed)
Pectoral minor strain/sprain: Danielle Mason underwent left shoulder arthroscopy and debridement in December 2019 and had been doing very well ever since.  Last week, she began experiencing left anterior shoulder/upper chest pain that is worse with deep palpation.  She and her husband just bought a house and has been doing quite a bit of moving.  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  She denies any cardiac symptoms, breast changes, feeling of lumps or breast discharge.  She is able to AB duct and adductor her left arm without any difficulties.  Physical exam, the left anterior upper chest was only tender to deep palpation.  Range of motion was intact.  Able to AB duct and adductor's left shoulder.  Internal and external motion was intact.  Limited breast exam did not reveal any breast changes.  Assessment: Suspicion for pectoral minor sprain/strain.  Very less likelihood for cardiac etiology as pain is nonexertional and also the fact that she denies chest pain, diaphoresis, nausea, dizziness or lightheadedness  Plan: - Advised to try Voltaren gel, lidocaine patch and Tylenol

## 2018-12-08 NOTE — Patient Instructions (Addendum)
Ms. Crick,   It was a pleasure taking care of you in the clinic today.  From my assessment and the physical exams, I believe for you have could be a muscle strain from all the lifting you have been doing to move into a new house.  My recommendation as far as pain is to try over-the-counter Voltaren gel and lidocaine patches.  Also trying to avoid excessive maneuver of your left arm.  You can also try warm compressions as well  Take care! Dr. Eileen Stanford  Please call the internal medicine center clinic if you have any questions or concerns, we may be able to help and keep you from a long and expensive emergency room wait. Our clinic and after hours phone number is [phone], the best time to call is Monday through Friday 9 am to 4 pm but there is always someone available 24/7 if you have an emergency. If you need medication refills please notify your pharmacy one week in advance and they will send [place] a request.
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
## 2018-12-09 NOTE — Progress Notes (Signed)
Internal Medicine Clinic Attending  Case discussed with Dr. Agyei at the time of the visit.  We reviewed the resident's history and exam and pertinent patient test results.  I agree with the assessment, diagnosis, and plan of care documented in the resident's note.    

## 2019-02-07 ENCOUNTER — Other Ambulatory Visit: Payer: Self-pay | Admitting: Internal Medicine

## 2019-02-07 DIAGNOSIS — H5711 Ocular pain, right eye: Secondary | ICD-10-CM

## 2019-03-11 ENCOUNTER — Encounter: Payer: BLUE CROSS/BLUE SHIELD | Admitting: Internal Medicine

## 2019-03-18 ENCOUNTER — Encounter: Payer: Self-pay | Admitting: Internal Medicine

## 2019-03-18 ENCOUNTER — Other Ambulatory Visit: Payer: Self-pay

## 2019-03-18 ENCOUNTER — Ambulatory Visit (INDEPENDENT_AMBULATORY_CARE_PROVIDER_SITE_OTHER): Payer: BLUE CROSS/BLUE SHIELD | Admitting: Internal Medicine

## 2019-03-18 VITALS — BP 111/67 | HR 101 | Temp 97.7°F | Ht 62.0 in | Wt 220.3 lb

## 2019-03-18 DIAGNOSIS — R05 Cough: Secondary | ICD-10-CM | POA: Diagnosis not present

## 2019-03-18 DIAGNOSIS — R7301 Impaired fasting glucose: Secondary | ICD-10-CM

## 2019-03-18 DIAGNOSIS — Z79899 Other long term (current) drug therapy: Secondary | ICD-10-CM | POA: Diagnosis not present

## 2019-03-18 DIAGNOSIS — I10 Essential (primary) hypertension: Secondary | ICD-10-CM | POA: Diagnosis not present

## 2019-03-18 DIAGNOSIS — M170 Bilateral primary osteoarthritis of knee: Secondary | ICD-10-CM | POA: Insufficient documentation

## 2019-03-18 DIAGNOSIS — R059 Cough, unspecified: Secondary | ICD-10-CM | POA: Insufficient documentation

## 2019-03-18 DIAGNOSIS — Z23 Encounter for immunization: Secondary | ICD-10-CM | POA: Insufficient documentation

## 2019-03-18 LAB — POCT GLYCOSYLATED HEMOGLOBIN (HGB A1C): Hemoglobin A1C: 5.9 % — AB (ref 4.0–5.6)

## 2019-03-18 LAB — GLUCOSE, CAPILLARY: Glucose-Capillary: 132 mg/dL — ABNORMAL HIGH (ref 70–99)

## 2019-03-18 MED ORDER — DICLOFENAC SODIUM 1 % EX GEL: 2.0000 g | Freq: Four times a day (QID) | CUTANEOUS | 2 refills | Status: DC

## 2019-03-18 MED ORDER — GUAIFENESIN 100 MG/5ML PO SYRP: 200.0000 mg | ORAL_SOLUTION | Freq: Three times a day (TID) | ORAL | 0 refills | Status: DC | PRN
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
## 2019-03-18 NOTE — Assessment & Plan Note (Signed)
Impaired fasting glucose: Versus diet-controlled diabetes Hgb A1c 5.9 % at the last visit. Current A1c 6.9%.  Upon further chart review I did identify that the patient had an A1c of 12.6% in July 2018 which was clearly an outlier but this resolved with Metformin only.  Although this alone would have been diagnostic he was accompanied by possible symptoms which are undefined.  Patient did endorse increased urination at that time but states that she felt she may have had a UTI.  I feel is equally likely that she has impaired fasting glucose tolerance instead of diet controlled diabetes given her limited action and such marked improvement.  Today the patient denied polyuria, polydipsia, headache, fatigue, confusion, nausea, vomiting or diaphoresis. Based on her A1c she does not have diabetes but is insulin resistant. Patient does not wish to be placed on Metformin due to concern for side effects she did not wish to discuss. We will opt for diet and exercise modifications.  A more definitive evaluation with glucose tolerance testing could be performed.  Plan: Continue weight loss and exercise Continue atorvastatin  Consider glucose tolerance testing for definitive evaluation of diabetes Repeat A1c at next visit

## 2019-03-18 NOTE — Assessment & Plan Note (Signed)
Mildly productive cough.  1 to 2 days duration. She denied fever, chills, chest pain, pain, sinus drainage, pharyngitis, conjunctivitis, or GI symptoms.  Guaifenesin 200mg /70ml TID daily PRN

## 2019-03-18 NOTE — Patient Instructions (Signed)
FOLLOW-UP INSTRUCTIONS When: 3-4 months with your primary care provider For: routine visit What to bring: All of your medications  I have not made any changes to your medications today.   Today we discussed your elevated hemoglobin A1c.  Today was 5.9 representing a significant decrease.  You are not diabetic but you are prediabetic.  This means you have a degree of insulin resistance which is best treated with diet and exercise as we discussed.  Please continue to take your currently prescribed medications notify us with any concerns you may have.  Please reconsider taking the influenza vaccination.  Thank you for your visit to the [facility] today. If you have any questions or concerns please call us at [phone].

## 2019-03-18 NOTE — Assessment & Plan Note (Signed)
Influenza vaccination: I advised the patient on the risk and benefits of the influenza vaccination today.  She states that you are she will forego this at this time.

## 2019-03-18 NOTE — Assessment & Plan Note (Signed)
Hypertension: Patient's BP today is 111/67 with a goal of <140/80. The patient endorses adherence to her medication regimen. She denied, chest pain, headache, visual changes, lightheadedness, weakness, dizziness on standing, swelling in the feet or ankles.   Plan: Continue HCTZ 25mg  Daily BMP at next visit with PCP or every 6 months

## 2019-03-19 ENCOUNTER — Telehealth: Payer: Self-pay | Admitting: *Deleted

## 2019-03-19 NOTE — Telephone Encounter (Addendum)
Information for PA for Diclofenac Gel was sent through CoverMyMeds .  Awaiting determination within 24 to 72 hours.  Sander Nephew, RN 03/19/2019 4:31 PM.   Fax from [employer]-Diclofenac not covered by [employer].  Patient is taking Ibuprofen which  is contraindicated with Volterean Gel usage  Message to be sent to  Dr. Berline Lopes.  Sander Nephew, RN 04/01/2019 9:57 AM.

## 2019-03-19 NOTE — Progress Notes (Signed)
Internal Medicine Clinic Attending  Case discussed with Dr. Harbrecht at the time of the visit.  We reviewed the resident's history and exam and pertinent patient test results.  I agree with the assessment, diagnosis, and plan of care documented in the resident's note.   

## 2019-03-25 ENCOUNTER — Other Ambulatory Visit: Payer: Self-pay | Admitting: Internal Medicine

## 2019-03-25 DIAGNOSIS — E782 Mixed hyperlipidemia: Secondary | ICD-10-CM

## 2019-03-25 DIAGNOSIS — I10 Essential (primary) hypertension: Secondary | ICD-10-CM

## 2019-03-25 NOTE — Telephone Encounter (Signed)
Pt came in physician office, physician was supposed to refill pt medicine, pls refills on medicine needed [phone]

## 2019-03-26 MED ORDER — HYDROCHLOROTHIAZIDE 25 MG PO TABS: 25.0000 mg | ORAL_TABLET | Freq: Every morning | ORAL | 1 refills | Status: DC

## 2019-03-26 MED ORDER — ATORVASTATIN CALCIUM 40 MG PO TABS: 40.0000 mg | ORAL_TABLET | Freq: Every day | ORAL | 1 refills | Status: DC

## 2019-03-26 MED FILL — ATORVASTATIN 40 MG TABLET: 40 | 90 days supply | Qty: 90 | Fill #1

## 2019-03-26 MED FILL — DICLOFENAC SODIUM 1 % GEL: 1 | 13 days supply | Qty: 100 | Fill #2

## 2019-03-26 MED FILL — HYDROCHLOROTHIAZIDE 25 MG T: 25 | 90 days supply | Qty: 90 | Fill #3

## 2019-03-30 ENCOUNTER — Encounter: Payer: Self-pay | Admitting: Internal Medicine

## 2019-03-30 ENCOUNTER — Other Ambulatory Visit: Payer: Self-pay | Admitting: Internal Medicine

## 2019-03-30 ENCOUNTER — Ambulatory Visit (INDEPENDENT_AMBULATORY_CARE_PROVIDER_SITE_OTHER): Payer: BLUE CROSS/BLUE SHIELD | Admitting: Internal Medicine

## 2019-03-30 ENCOUNTER — Other Ambulatory Visit: Payer: Self-pay

## 2019-03-30 DIAGNOSIS — M25472 Effusion, left ankle: Secondary | ICD-10-CM | POA: Diagnosis not present

## 2019-03-30 DIAGNOSIS — S93492A Sprain of other ligament of left ankle, initial encounter: Secondary | ICD-10-CM

## 2019-03-30 DIAGNOSIS — R05 Cough: Secondary | ICD-10-CM

## 2019-03-30 DIAGNOSIS — R059 Cough, unspecified: Secondary | ICD-10-CM

## 2019-03-30 DIAGNOSIS — M25572 Pain in left ankle and joints of left foot: Secondary | ICD-10-CM | POA: Diagnosis not present

## 2019-03-30 DIAGNOSIS — S93402A Sprain of unspecified ligament of left ankle, initial encounter: Secondary | ICD-10-CM

## 2019-03-30 HISTORY — DX: Sprain of unspecified ligament of left ankle, initial encounter: S93.402A

## 2019-03-30 MED ORDER — GUAIFENESIN-CODEINE 100-10 MG/5ML PO SYRP: 5.0000 mL | ORAL_SOLUTION | Freq: Three times a day (TID) | ORAL | 0 refills | Status: DC | PRN

## 2019-03-30 MED FILL — GUAIATUSSIN AC LIQUID: 100-10 | 8 days supply | Qty: 120 | Fill #0

## 2019-03-30 MED FILL — ATORVASTATIN 40 MG TABLET: 40 | 90 days supply | Qty: 90 | Fill #1

## 2019-03-30 NOTE — Assessment & Plan Note (Signed)
Cough: Patient endorsed a return of intermittent cough a few weeks duration.  This was following which she believes to be a URI.  Cough is mild and frustrated the patient.  She denies significant depression of mucus.  Given subacute presentation we will attempt symptomatic management guaifenesin codeine.  However, at this is prolonged or frequently recurs I would recommend chest x-ray more extensive evaluation for chronic cough etiology.

## 2019-03-30 NOTE — Assessment & Plan Note (Signed)
Left ankle swelling and tenderness: Patient endorses a 5 to 6-day history of left medial ankle swelling and tenderness.  She stated that after her recent visit this past week she had  an extensively busy day with a lot of climbing up onto couches and chairs to place her Christmas lights and prolonged episode of time spent on her feet shopping.  At the end of the day she noted left medial ankle tenderness and some mild puffiness/swelling.  She was concerned this might be a DVT so she decided to visit the office today.  She denies any other associated symptoms, calf pain, calf swelling, shortness of breath, color change of the ankle, numbness or tingling. On presentation today there is a faint mild swelling of the posterior medial malleolus on the left as compared to the right.  There is some mild/moderate pain on palpation at this area.  The patient is able to bear weight with a ease, strength is intact, as is sensation with a normal ROM.  There is no deformity of the Achilles tendon. Etiology most likely grade 1 ankle sprain  Plan: RICE with ibuprofen or Voltaren gel and elastic bands recommendations made Return if symptoms worsen or fail to improve

## 2019-03-30 NOTE — Patient Instructions (Addendum)
FOLLOW-UP INSTRUCTIONS When: As needed until your next scheduled appointment with your primary care doctor What to bring: All of your medications  For your left-sided grade 1 ankle sprain I recommend that you purchase an elastic bandage or Ace bandage to provide additional support to reduce swelling.  When applying this please make certain that your toes do not discolor but do apply with sufficient pressure to provide support.  Please avoid excessive use of this joint for the next 7 to 10 days and apply ice if beneficial. You may use NSAIDs for the pain and swelling such as ibuprofen up to 800 mg 2 times daily or apply the topical Voltaren gel.  Thank you for your visit to the [facility] today. If you have any questions or concerns please call us at [phone].
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
## 2019-03-31 NOTE — Progress Notes (Signed)
Internal Medicine Clinic Attending  Case discussed with Dr. Harbrecht at the time of the visit.  We reviewed the resident's history and exam and pertinent patient test results.  I agree with the assessment, diagnosis, and plan of care documented in the resident's note.   

## 2019-04-30 ENCOUNTER — Telehealth: Payer: Self-pay | Admitting: Internal Medicine

## 2019-04-30 ENCOUNTER — Ambulatory Visit: Payer: Self-pay | Admitting: *Deleted

## 2019-04-30 NOTE — Telephone Encounter (Signed)
Called Danielle Mason - stated she went to a site [facility] off [address] to get tested for covid also her husband. Stated she tested positive but he test was negative. She wants to know if this is possible; I told her yes. Stated she nor her husband has any symptoms. Told her I had not heard of this test site. Told her a person can be positive but exhibit no symptoms. I gave her #'s to [facility] to schedule an appt which she requested ; she wants to be sure" peace of mind". Told her, in the meantime, her and her husband stay in separate rooms, good hand washing , wear masks -stated she knows. Also her husband is planning to go to the [place] to get re-tested.

## 2019-04-30 NOTE — Telephone Encounter (Signed)
With one person in the home who tested positive Covid assume the others are positive. Wear mask when you are out of your designated area and keep your distance just in case others don't already have the virus. Disinfect all common household areas and have everyone wash hands frequently. Drink lots of water and get rest. Monitor your breathing and call your doctor with any concerns. All members isolate for the 10 days.   Reason for Disposition . Health Information question, no triage required and triager able to answer question  Answer Assessment - Initial Assessment Questions 1. REASON FOR CALL or QUESTION: "What is your reason for calling today?" or "How can I best help you?" or "What question do you have that I can help answer?"     Positive and negative covid 19 results in the same household.  Protocols used: INFORMATION ONLY CALL-A-AH

## 2019-04-30 NOTE — Telephone Encounter (Signed)
Thank you for letting me know

## 2019-04-30 NOTE — Telephone Encounter (Signed)
pls contact [phone], pt regarding covid results

## 2019-05-01 ENCOUNTER — Other Ambulatory Visit: Payer: Self-pay | Admitting: Internal Medicine

## 2019-05-01 ENCOUNTER — Telehealth: Payer: Self-pay | Admitting: *Deleted

## 2019-05-01 DIAGNOSIS — U071 COVID-19: Secondary | ICD-10-CM

## 2019-05-01 NOTE — Telephone Encounter (Signed)
Would you please place an order for COVID testing. Gave her the ph# to make appt. She is symptom free. A friend told her to go to an office building and get tested and then told her to call her md for abx. She was comfortable with the people she saw, it was not a md office.

## 2019-05-05 ENCOUNTER — Ambulatory Visit: Payer: 59 | Attending: Internal Medicine

## 2019-05-05 DIAGNOSIS — U071 COVID-19: Secondary | ICD-10-CM | POA: Insufficient documentation

## 2019-05-05 DIAGNOSIS — Z20822 Contact with and (suspected) exposure to covid-19: Secondary | ICD-10-CM

## 2019-05-08 LAB — NOVEL CORONAVIRUS, NAA: SARS-CoV-2, NAA: DETECTED — AB

## 2019-05-11 ENCOUNTER — Other Ambulatory Visit: Payer: Self-pay

## 2019-05-11 ENCOUNTER — Ambulatory Visit (INDEPENDENT_AMBULATORY_CARE_PROVIDER_SITE_OTHER): Payer: Self-pay | Admitting: Internal Medicine

## 2019-05-11 ENCOUNTER — Telehealth: Payer: Self-pay | Admitting: Internal Medicine

## 2019-05-11 DIAGNOSIS — U071 COVID-19: Secondary | ICD-10-CM

## 2019-05-11 HISTORY — DX: COVID-19: U07.1

## 2019-05-11 NOTE — Assessment & Plan Note (Signed)
She has been asymptomatic with covid-19 but repeat testing on 1/19 came back positive again. She heard if she got antibiotics it would help her test become negative. Throughout her infection she denies sob, cough, dysgeusia, fever, chills, nausea, or any other symptoms.  Discussed that antibiotics will not affect her test. She is able to stop self quarantine 10 days after last positive test if she is asymptomatic. She endorsed understanding.

## 2019-05-11 NOTE — Telephone Encounter (Signed)
Called pt - stated she tested positive for covid. Requesting an abx, stated she having some burning with urination; urine dk yellow. But denies odor.Stated her friend told an abx and was better. Also denies sob,cp,fever, and change in taste. Suggested scheduling a telehealth appt - she's agreeable.  Telehealth appt scheduled this am @ 1045 AM; pt informed. Telephone# [phone].

## 2019-05-11 NOTE — Telephone Encounter (Signed)
Pls contact pt [phone]
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
## 2019-05-12 ENCOUNTER — Telehealth: Payer: Self-pay | Admitting: Internal Medicine

## 2019-05-12 NOTE — Telephone Encounter (Signed)
Pt needs to have a date as to  when she can come out of quarantine. Pt states her beautician is requiring this information before she can have an appointment.  Pt would ike to talk someone about this.

## 2019-05-13 ENCOUNTER — Encounter: Payer: Self-pay | Admitting: Internal Medicine

## 2019-05-13 NOTE — Telephone Encounter (Signed)
I previously spoke with her about this. I forwarded a letter that she can have someone pick up or have faxed if she would like.

## 2019-05-13 NOTE — Telephone Encounter (Signed)
Patient notified that end of quarantine is 05/16/2019. She requested letter to be mailed to her. Address verified and letter mailed. Hubbard Hartshorn, BSN, RN-BC

## 2019-05-13 NOTE — Progress Notes (Signed)
Internal Medicine Clinic Attending  Case discussed with Dr. Seawell at the time of the visit.  We reviewed the resident's history and exam and pertinent patient test results.  I agree with the assessment, diagnosis, and plan of care documented in the resident's note.    

## 2019-05-25 ENCOUNTER — Telehealth: Payer: Self-pay

## 2019-05-25 NOTE — Telephone Encounter (Signed)
Questions about COVID injection. Please call pt back.

## 2019-05-25 NOTE — Telephone Encounter (Signed)
RTC to patient, she has questions on the Covid vaccine and if she is a candidate, as well as when she should get it.  Pt states she has never had a vaccine in her life.  Pt states she has severe allergic reactions (see chart).  Pt tested positive for Covid on 05/05/19 and states she is disappointed her MD did not call and check on her.  She would like a call back from her PCP. Thank you, SChaplin, RN,BSN

## 2019-05-30 NOTE — Telephone Encounter (Signed)
Returned call to patient and let her know she should be fine to get the vaccine.

## 2019-06-01 ENCOUNTER — Ambulatory Visit (INDEPENDENT_AMBULATORY_CARE_PROVIDER_SITE_OTHER): Payer: Self-pay | Admitting: Internal Medicine

## 2019-06-01 ENCOUNTER — Other Ambulatory Visit: Payer: Self-pay

## 2019-06-01 ENCOUNTER — Encounter: Payer: Self-pay | Admitting: Internal Medicine

## 2019-06-01 VITALS — BP 133/70 | HR 83 | Temp 98.0°F | Ht 62.0 in | Wt 218.7 lb

## 2019-06-01 DIAGNOSIS — N3000 Acute cystitis without hematuria: Secondary | ICD-10-CM

## 2019-06-01 LAB — POCT URINALYSIS DIPSTICK
Bilirubin, UA: NEGATIVE
Blood, UA: NEGATIVE
Glucose, UA: NEGATIVE
Ketones, UA: NEGATIVE
Nitrite, UA: NEGATIVE
Protein, UA: NEGATIVE
Spec Grav, UA: 1.025 (ref 1.010–1.025)
Urobilinogen, UA: 0.2 E.U./dL
pH, UA: 5.5 (ref 5.0–8.0)

## 2019-06-01 MED ORDER — NITROFURANTOIN MONOHYD MACRO 100 MG PO CAPS: 100.0000 mg | ORAL_CAPSULE | Freq: Two times a day (BID) | ORAL | 0 refills | Status: AC

## 2019-06-01 MED FILL — NITROFURANTOIN MONO-MCR 100: 100 | 5 days supply | Qty: 10 | Fill #0

## 2019-06-01 NOTE — Assessment & Plan Note (Signed)
Mrs. Lick is presenting today with 1 week of urinary urgency and frequency, in addition to cloudy urine. She denies fever, chills, abdominal pain, dysuria, hematuria. Urine dip is positive for leukocytes. Urine culture is pending. Will treat as simple cystitis since she is symptomatic.   Plan:  - Macrobid 100 mg BID x 5 days  - Urine culture pending; if antibiotic needs to be changed, will contact patient

## 2019-06-01 NOTE — Patient Instructions (Addendum)
It was nice seeing you today! Thank you for choosing [facility] for your Primary Care.    Today we talked about:   1. Urinary Tract Infection: I have sent an antibiotic to your pharmacy called Macrobid (Nitrofurantoin). Please take twice per day for 5 days.
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
## 2019-06-03 LAB — URINE CULTURE

## 2019-06-08 NOTE — Progress Notes (Signed)
Internal Medicine Clinic Attending  Case discussed with Dr. Basaraba at the time of the visit.  We reviewed the resident's history and exam and pertinent patient test results.  I agree with the assessment, diagnosis, and plan of care documented in the resident's note.    

## 2019-06-16 MED FILL — ATORVASTATIN 40 MG TABLET: 40 | 30 days supply | Qty: 30 | Fill #0

## 2019-06-16 MED FILL — HYDROCHLOROTHIAZIDE 25 MG T: 25 | 90 days supply | Qty: 90 | Fill #0

## 2019-06-17 ENCOUNTER — Telehealth: Payer: Self-pay | Admitting: *Deleted

## 2019-06-17 ENCOUNTER — Ambulatory Visit (INDEPENDENT_AMBULATORY_CARE_PROVIDER_SITE_OTHER): Payer: 59 | Admitting: Internal Medicine

## 2019-06-17 VITALS — BP 116/68 | HR 89 | Wt 221.1 lb

## 2019-06-17 DIAGNOSIS — M170 Bilateral primary osteoarthritis of knee: Secondary | ICD-10-CM | POA: Diagnosis not present

## 2019-06-17 DIAGNOSIS — E782 Mixed hyperlipidemia: Secondary | ICD-10-CM

## 2019-06-17 DIAGNOSIS — Z8639 Personal history of other endocrine, nutritional and metabolic disease: Secondary | ICD-10-CM

## 2019-06-17 DIAGNOSIS — I10 Essential (primary) hypertension: Secondary | ICD-10-CM

## 2019-06-17 DIAGNOSIS — U071 COVID-19: Secondary | ICD-10-CM

## 2019-06-17 DIAGNOSIS — R7301 Impaired fasting glucose: Secondary | ICD-10-CM

## 2019-06-17 DIAGNOSIS — N3000 Acute cystitis without hematuria: Secondary | ICD-10-CM

## 2019-06-17 LAB — POCT GLYCOSYLATED HEMOGLOBIN (HGB A1C): Hemoglobin A1C: 5.7 % — AB (ref 4.0–5.6)

## 2019-06-17 LAB — GLUCOSE, CAPILLARY: Glucose-Capillary: 93 mg/dL (ref 70–99)

## 2019-06-17 MED ORDER — ATORVASTATIN CALCIUM 40 MG PO TABS: 40.0000 mg | ORAL_TABLET | Freq: Every day | ORAL | 1 refills | Status: DC

## 2019-06-17 MED ORDER — IBUPROFEN 600 MG PO TABS: 600.0000 mg | ORAL_TABLET | Freq: Three times a day (TID) | ORAL | 0 refills | Status: DC | PRN

## 2019-06-17 MED ORDER — HYDROCHLOROTHIAZIDE 25 MG PO TABS: 25.0000 mg | ORAL_TABLET | Freq: Every morning | ORAL | 1 refills | Status: DC

## 2019-06-17 MED FILL — DICLOFENAC SODIUM 1 % GEL: 1 | 6 days supply | Qty: 100 | Fill #0

## 2019-06-17 NOTE — Assessment & Plan Note (Signed)
BP Readings from Last 3 Encounters:  06/17/19 116/68  06/01/19 133/70  03/30/19 (!) 136/92   Medications include HCTZ 25 mg qd. Blood pressure well controlled.   - BMP today  - refill HCTZ

## 2019-06-17 NOTE — Assessment & Plan Note (Addendum)
History of diabetes with A1c decreased over time. Last Hgb A1c 5.9. Today 5.7. She is not currently taking any medications and has been trying to eat healthy. Glucose 93.   - has opthalmology appt on the 15th - continue atorvastatin

## 2019-06-17 NOTE — Patient Instructions (Addendum)
Thank you for allowing [place] to provide your care today.   I have ordered the following labs for you:  Basic metabolic panel    I will call if any are abnormal.    Please follow-up in six months for blood pressure check.    Please call the internal medicine center clinic if you have any questions or concerns, we may be able to help and keep you from a long and expensive emergency room wait. Our clinic and after hours phone number is [phone], the best time to call is Monday through Friday 9 am to 4 pm but there is always someone available 24/7 if you have  an emergency. If you need medication refills please notify your pharmacy one week in advance and they will send [place] a request.

## 2019-06-17 NOTE — Assessment & Plan Note (Signed)
Discussed obtaining covid-19 vaccination. She was covid-19 positive in January. No longer with symptoms. Provided information on obtaining the vaccination.
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
## 2019-06-17 NOTE — Assessment & Plan Note (Signed)
She has not had any symptoms and completed her antibiotic therapy.

## 2019-06-17 NOTE — Assessment & Plan Note (Signed)
Last Lipid panel 2019 showing well controlled hyperlipidemia with atorvastatin.   - refill Atorvastatin 40 mg

## 2019-06-17 NOTE — Telephone Encounter (Addendum)
Call made to pt after she did not arrive for 1:15pm appt-pt states that she is inside the building and making her way down.  Pcp will see patient.Despina Hidden Cassady3/3/20211:36 PM

## 2019-06-17 NOTE — Assessment & Plan Note (Addendum)
She takes ibuprofen for pain intermittently. She states she does not take this daily, sometimes only a few times a month and that she will sometimes use tylenol as well. Discussed the importance of tylenol use first, for which she endorsed understanding as this can exacerbate her GERD and cause multiple other issues.   - refill ibuprofen.

## 2019-06-18 LAB — BMP8+ANION GAP
Anion Gap: 14 mmol/L (ref 10.0–18.0)
BUN/Creatinine Ratio: 26 (ref 12–28)
BUN: 20 mg/dL (ref 8–27)
CO2: 22 mmol/L (ref 20–29)
Calcium: 9.7 mg/dL (ref 8.7–10.3)
Chloride: 104 mmol/L (ref 96–106)
Creatinine, Ser: 0.77 mg/dL (ref 0.57–1.00)
GFR calc Af Amer: 97 mL/min/{1.73_m2} (ref >59)
GFR calc non Af Amer: 84 mL/min/{1.73_m2} (ref >59)
Glucose: 94 mg/dL (ref 65–99)
Potassium: 4.1 mmol/L (ref 3.5–5.2)
Sodium: 140 mmol/L (ref 134–144)

## 2019-06-23 ENCOUNTER — Ambulatory Visit (INDEPENDENT_AMBULATORY_CARE_PROVIDER_SITE_OTHER): Payer: 59 | Admitting: Internal Medicine

## 2019-06-23 ENCOUNTER — Other Ambulatory Visit: Payer: Self-pay

## 2019-06-23 ENCOUNTER — Encounter: Payer: Self-pay | Admitting: Internal Medicine

## 2019-06-23 DIAGNOSIS — R0981 Nasal congestion: Secondary | ICD-10-CM

## 2019-06-23 DIAGNOSIS — U071 COVID-19: Secondary | ICD-10-CM

## 2019-06-23 HISTORY — DX: Nasal congestion: R09.81

## 2019-06-23 MED ORDER — FLUTICASONE FUROATE 27.5 MCG/SPRAY NA SUSP: 2.0000 | Freq: Every day | NASAL | 1 refills | Status: DC

## 2019-06-23 NOTE — Assessment & Plan Note (Signed)
Denied cough, weakness or chest pain. Has improved well with all symptom resolution since COVID with the exception of her sinus drainage. See A/P for that from the same date.
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
## 2019-06-23 NOTE — Assessment & Plan Note (Signed)
Sinus drainage: Patient has been experiencing a thick yellow mucus discharge since January following her COVID-19 confirmed illness. She will at times need to blow her nose but otherwise denied symptoms. She denied sinus pain, ear pain, eye pain, sore throat, fever, chills, eye pain, eye irritation or redness, rhinorrhea or cough.  This appears to be a prolonged inflammatory response from her URI/LRI with an allergic component. I feel she would most benefit from sinus rinses followed by local antiinflammatory therapy. Chronic sinusitis.    Plan: Neti pot or similar off brand sinus rinse The rinses to be followed by fluticasone into each nostril  If no improvement in at least 7 days she may need imaging after a course of antibiotics

## 2019-06-23 NOTE — Progress Notes (Signed)
Internal Medicine Clinic Attending  Case discussed with Dr. Harbrecht at the time of the visit.  We reviewed the resident's history and pertinent patient test results.  I agree with the assessment, diagnosis, and plan of care documented in the resident's note.    

## 2019-06-26 MED FILL — ATORVASTATIN 40 MG TABLET: 40 | 30 days supply | Qty: 30 | Fill #0

## 2019-06-26 NOTE — Progress Notes (Signed)
Internal Medicine Clinic Attending  Case discussed with Dr. Seawell at the time of the visit.  We reviewed the resident's history and exam and pertinent patient test results.  I agree with the assessment, diagnosis, and plan of care documented in the resident's note.    

## 2019-06-29 DIAGNOSIS — H2513 Age-related nuclear cataract, bilateral: Secondary | ICD-10-CM | POA: Insufficient documentation

## 2019-06-30 ENCOUNTER — Encounter: Payer: Self-pay | Admitting: Internal Medicine

## 2019-06-30 ENCOUNTER — Ambulatory Visit (INDEPENDENT_AMBULATORY_CARE_PROVIDER_SITE_OTHER): Payer: 59 | Admitting: Internal Medicine

## 2019-06-30 ENCOUNTER — Other Ambulatory Visit: Payer: Self-pay

## 2019-06-30 VITALS — BP 123/75 | HR 81 | Wt 221.5 lb

## 2019-06-30 DIAGNOSIS — N898 Other specified noninflammatory disorders of vagina: Secondary | ICD-10-CM | POA: Insufficient documentation

## 2019-06-30 DIAGNOSIS — Z87440 Personal history of urinary (tract) infections: Secondary | ICD-10-CM

## 2019-06-30 LAB — POCT URINALYSIS DIPSTICK
Bilirubin, UA: NEGATIVE
Glucose, UA: NEGATIVE
Ketones, UA: NEGATIVE
Nitrite, UA: NEGATIVE
Protein, UA: NEGATIVE
Spec Grav, UA: 1.025 (ref 1.010–1.025)
Urobilinogen, UA: 0.2 E.U./dL
pH, UA: 5.5 (ref 5.0–8.0)

## 2019-06-30 NOTE — Assessment & Plan Note (Addendum)
Vaginal discharge: Patient concerned today for low volume white vaginal discharge absent associated symptoms. She denied vaginal burning, irritation, discolored discharge, bloody discharge, or fever.  She has sexual intercourse with her spouse only. Recently treated for a mildly symptomatic UTI with a five day course of nitrofurantoin.  On 08/30/2010 she underwent total abdominal hysterectomy with left salpingo-oophorectomy. She notes slight blood-streaked TP while wiping after urination but is not certain if this is vaginal vs urethral believing it to be the former. Patient denies hematuria, dysuria, pelvic pain, fever or chills lowering concern for UTI. Based on the signs/symptoms, recent antibiotic therapy, and history of TAH I feel she most likely has a mild vaginal candidiasis versus BV infection.  Given the lack of concerning symptoms and the patient's preference to forego pelvic exam at this time I do not see a current indication for antibiotic or antifungal therapy.  Plan: The patient will continue to monitor her symptoms for worsening at which point she will notify us. Considerable pelvic exam if symptoms progress Patient also notes a sensation of vaginal dryness and less comfort with sexual intercourse which could be consistent with estrogen deficiency and early vaginal atrophy. Recommend vaginal exam and consideration of estrogen topical cream if exam is consistent at her next visit TRACE Hgb on dipstick. Recommend repeating UA in 2-3 months and considering CT Renal study if not resolved.

## 2019-06-30 NOTE — Patient Instructions (Addendum)
FOLLOW-UP INSTRUCTIONS When: ~2 months with your PCP For: Routine and repeat UA What to bring: All of your medications  Today we discussed the slight vaginal discharge. I do not feel that this represents an infection that would need to be treated today based on the lack of other symptoms. I do feel that if you develop burning, irritation, pain or other concerning vaginal or urinary symptoms that you should notify us and we will treat you.   For your fingers, please continue to reduce the amount of strenuous activity that you perform with them until they recover. If they do not improve in the next 2-4 wks please let us know and we will make further recommendations at that time.   Please follow-up with your primary care doctor in 2 months for a repeat urine test.  As always if your symptoms worsen, fail to improve, or you develop other concerning symptoms, please notify our office or visit the local ER if we are unavailable. Symptoms including fever, chills, severe pain, should not be ignored and should encourage you to visit the ED if we are unavailable by phone or the symptoms are severe.  Thank you for your visit to the [facility] today. If you have any questions or concerns please call us at [phone].
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
## 2019-07-02 NOTE — Progress Notes (Signed)
Internal Medicine Clinic Attending  Case discussed with Dr. Harbrecht at the time of the visit.  We reviewed the resident's history and exam and pertinent patient test results.  I agree with the assessment, diagnosis, and plan of care documented in the resident's note.  Parissa Chiao, M.D., Ph.D.  

## 2019-07-03 MED FILL — ATORVASTATIN 40 MG TABLET: 40 | 30 days supply | Qty: 30 | Fill #0

## 2019-07-06 ENCOUNTER — Encounter: Payer: Self-pay | Admitting: Internal Medicine

## 2019-07-06 ENCOUNTER — Ambulatory Visit (INDEPENDENT_AMBULATORY_CARE_PROVIDER_SITE_OTHER): Payer: 59 | Admitting: Internal Medicine

## 2019-07-06 VITALS — BP 136/76 | HR 91 | Temp 98.1°F | Ht 62.0 in | Wt 219.3 lb

## 2019-07-06 DIAGNOSIS — M79606 Pain in leg, unspecified: Secondary | ICD-10-CM | POA: Insufficient documentation

## 2019-07-06 DIAGNOSIS — N898 Other specified noninflammatory disorders of vagina: Secondary | ICD-10-CM | POA: Diagnosis not present

## 2019-07-06 DIAGNOSIS — M79605 Pain in left leg: Secondary | ICD-10-CM | POA: Diagnosis not present

## 2019-07-06 NOTE — Assessment & Plan Note (Addendum)
Patient reports 5 days of pain in her anterior medial left shin (several inches below her knee joint). The pain feels like needle and is a 3/10 when it occurs. It does not radiate. It occurs usually when she gets into bed for about 5 minutes and sometimes when she is getting up. She has tried some OTC pain relievers and these have helped some.   She has come in today as she was concerned about possible blood clots after she heard about this occurring in some people who received the [place] COVID-19 vaccine which she had. She was reassured that this is not a blood clot and was advised on what to look out for (calf pain, unilateral leg swelling, sudden SOB if PE).  This pain is of unclear etiology, but is mild and does not affect her day. She did have a small fall 2 weeks ago with no injuries that could have contributed. MSK pain is possible; she has Voltaren gel that she uses for her ankle and I advised her to try this on her shin as well.  - Continue to Monitor - Trial of Voltaren gel

## 2019-07-06 NOTE — Assessment & Plan Note (Signed)
Patient asked again about plan for follow up of her hematuria. She was reminded of that she is to follow up with PCP in about 2 month for recheck and consideration of possible CT. See Note from 06/30/2019 - Follow up as planned

## 2019-07-06 NOTE — Patient Instructions (Addendum)
Thank you for allowing [place] to care for you  For you leg pain - This may be musculoskeletal pain - Try Voltaren gel as needed  Follow up with PCP in about 2 months
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
## 2019-07-07 NOTE — Progress Notes (Signed)
Internal Medicine Clinic Attending  Case discussed with Dr. Melvin  at the time of the visit.  We reviewed the resident's history and exam and pertinent patient test results.  I agree with the assessment, diagnosis, and plan of care documented in the resident's note.  

## 2019-07-27 ENCOUNTER — Other Ambulatory Visit: Payer: Self-pay

## 2019-07-27 ENCOUNTER — Ambulatory Visit (INDEPENDENT_AMBULATORY_CARE_PROVIDER_SITE_OTHER): Payer: 59 | Admitting: Internal Medicine

## 2019-07-27 VITALS — BP 127/68 | HR 77 | Temp 97.6°F | Ht 62.0 in | Wt 216.7 lb

## 2019-07-27 DIAGNOSIS — R319 Hematuria, unspecified: Secondary | ICD-10-CM

## 2019-07-27 DIAGNOSIS — R3129 Other microscopic hematuria: Secondary | ICD-10-CM | POA: Diagnosis not present

## 2019-07-27 LAB — POCT URINALYSIS DIPSTICK
Bilirubin, UA: NEGATIVE
Blood, UA: NEGATIVE
Glucose, UA: NEGATIVE
Ketones, UA: NEGATIVE
Nitrite, UA: NEGATIVE
Protein, UA: NEGATIVE
Spec Grav, UA: 1.025 (ref 1.010–1.025)
Urobilinogen, UA: 0.2 E.U./dL
pH, UA: 6.5 (ref 5.0–8.0)

## 2019-07-27 NOTE — Patient Instructions (Signed)
Ms. Forestier, It was a pleasure meeting you. I'm glad the blood in your urine has resolved and you can feel relieved.  You do not need to follow-up with your PCP next month since we have already addressed this. Plan to see her as scheduled.   Take care, Dr. Koleen Distance

## 2019-07-28 ENCOUNTER — Encounter: Payer: Self-pay | Admitting: Internal Medicine

## 2019-07-28 DIAGNOSIS — R319 Hematuria, unspecified: Secondary | ICD-10-CM | POA: Insufficient documentation

## 2019-07-28 HISTORY — DX: Hematuria, unspecified: R31.9
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
## 2019-07-28 NOTE — Assessment & Plan Note (Signed)
Patient noted to have microscopic hematuria during work-up for vaginal discharge last month. It was recommended that she have a repeat U/A to monitor for resolution in 2-3 months at her next PCP visit. However, patient has been quite concerned over this and requested to be seen regarding this issue earlier. Urine dipstick negative for hgb today, indicating it was likely in the setting of recent cystitis. No need for further work-up.

## 2019-08-03 NOTE — Progress Notes (Addendum)
Internal Medicine Clinic Attending  Case discussed with Dr. Bloomfield at the time of the visit.  We reviewed the resident's history and exam and pertinent patient test results.  I agree with the assessment, diagnosis, and plan of care documented in the resident's note.  

## 2019-08-25 ENCOUNTER — Encounter: Payer: Self-pay | Admitting: Internal Medicine
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
## 2019-08-26 ENCOUNTER — Encounter: Payer: 59 | Admitting: Internal Medicine

## 2019-08-26 ENCOUNTER — Ambulatory Visit (INDEPENDENT_AMBULATORY_CARE_PROVIDER_SITE_OTHER): Payer: 59 | Admitting: Internal Medicine

## 2019-08-26 ENCOUNTER — Other Ambulatory Visit: Payer: Self-pay

## 2019-08-26 ENCOUNTER — Encounter: Payer: Self-pay | Admitting: Internal Medicine

## 2019-08-26 VITALS — BP 131/83 | HR 86 | Temp 98.4°F | Ht 62.0 in | Wt 219.0 lb

## 2019-08-26 DIAGNOSIS — R300 Dysuria: Secondary | ICD-10-CM

## 2019-08-26 DIAGNOSIS — R109 Unspecified abdominal pain: Secondary | ICD-10-CM

## 2019-08-26 DIAGNOSIS — R350 Frequency of micturition: Secondary | ICD-10-CM

## 2019-08-26 DIAGNOSIS — Z13820 Encounter for screening for osteoporosis: Secondary | ICD-10-CM

## 2019-08-26 DIAGNOSIS — R1030 Lower abdominal pain, unspecified: Secondary | ICD-10-CM

## 2019-08-26 DIAGNOSIS — I10 Essential (primary) hypertension: Secondary | ICD-10-CM

## 2019-08-26 DIAGNOSIS — Z0000 Encounter for general adult medical examination without abnormal findings: Secondary | ICD-10-CM | POA: Diagnosis not present

## 2019-08-26 LAB — POCT URINALYSIS DIPSTICK
Bilirubin, UA: NEGATIVE
Blood, UA: NEGATIVE
Glucose, UA: NEGATIVE
Ketones, UA: NEGATIVE
Nitrite, UA: NEGATIVE
Protein, UA: NEGATIVE
Spec Grav, UA: 1.025 (ref 1.010–1.025)
Urobilinogen, UA: 0.2 E.U./dL
pH, UA: 5.5 (ref 5.0–8.0)

## 2019-08-26 NOTE — Patient Instructions (Addendum)
Thank you for allowing [place] to provide your care today. Today we discussed **  Today we made the following changes to your medications:   Please START taking  Multivitamin - one that you can try is Centrum or One a Day.   Please follow-up in one year.    Please call the internal medicine center clinic if you have any questions or concerns, we may be able to help and keep you from a long and expensive emergency room wait. Our clinic and after hours phone number is [phone], the best time to call is Monday through Friday 9 am to 4 pm but there is always someone available 24/7 if you have an emergency. If you need medication refills please notify your pharmacy one week in advance and they will send [place] a request.

## 2019-08-26 NOTE — Assessment & Plan Note (Addendum)
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 They are both vaccinated. She discussed this with work and they stated if she brought a note of leave from her doctor she could take off the rest of the year. I discussed with her that while I cannot pride a note excusing her from work, I could provide one that discussed her own concerns, to which she was agreeable. Also encouraged use of N-95 if she unable to take time off of work.   - letter provided discussing patient's concerns  - has received both covid vaccinations through [facility] and [place] - had mammogram in February. Not showing up in chart. Will contact the [place]  - last pap smear with cotesting in 2018 wnl

## 2019-08-26 NOTE — Assessment & Plan Note (Addendum)
About two weeks ago she was having abdominal pain and very hard stools. She noted the abdominal pain was occurring after taking vitamin C, vitamin D3, and a multivitamin of calcium and magnesium. Her pain and hard stools have since stopped as she stopped taking these multivitamins. She also endorses increased urinary frequency without dysuria which resolved.   Reviewing the bottles, it appears that some of the vitamins are >600% of the daily recommendation. She denies nausea, diarrhea, constipation, BRB or dark stool Last colonoscopy appears to be 03/2011. Sessile polyp in ascending colon, hyperplastic on pathology, internal hemorrhoids, otherwise normal. Due for screening colonscopy 2022.   - POC UA wnl - stop vitamins - recommended starting with multivitamin, taking with a meal and avoiding extremely high doses in the future

## 2019-08-26 NOTE — Assessment & Plan Note (Signed)
BP Readings from Last 3 Encounters:  08/26/19 131/83  07/27/19 127/68  07/06/19 136/76   Blood pressure well controlled on HCTZ 25 mg qd.  - continue current medications

## 2019-08-27 NOTE — Progress Notes (Signed)
Internal Medicine Clinic Attending  Case discussed with Dr. Seawell at the time of the visit.  We reviewed the resident's history and exam and pertinent patient test results.  I agree with the assessment, diagnosis, and plan of care documented in the resident's note.  Imagine Nest, M.D., Ph.D.  

## 2019-10-02 ENCOUNTER — Other Ambulatory Visit: Payer: 59

## 2019-10-02 MED FILL — ATORVASTATIN 40 MG TABLET: 40 | 90 days supply | Qty: 90 | Fill #0

## 2019-10-02 MED FILL — HYDROCHLOROTHIAZIDE 25 MG T: 25 | 90 days supply | Qty: 90 | Fill #1

## 2019-10-16 ENCOUNTER — Ambulatory Visit (INDEPENDENT_AMBULATORY_CARE_PROVIDER_SITE_OTHER): Payer: 59 | Admitting: Internal Medicine

## 2019-10-16 ENCOUNTER — Other Ambulatory Visit: Payer: Self-pay

## 2019-10-16 DIAGNOSIS — S93401A Sprain of unspecified ligament of right ankle, initial encounter: Secondary | ICD-10-CM

## 2019-10-16 DIAGNOSIS — S93491A Sprain of other ligament of right ankle, initial encounter: Secondary | ICD-10-CM | POA: Diagnosis not present

## 2019-10-16 HISTORY — DX: Sprain of unspecified ligament of right ankle, initial encounter: S93.401A

## 2019-10-16 MED FILL — IBUPROFEN 600 MG TABLET: 600 | 10 days supply | Qty: 30 | Fill #0

## 2019-10-16 NOTE — Assessment & Plan Note (Signed)
Patient endorses a 5-day history of right ankle pain.  She notes that she was running around in church on Sunday when she sprained her ankle when trying to halt.  She denies any direct injury.  She notes that the pain initially started on Monday morning.  Initially started worsening while she was wearing flats.  However, she was instructed to start wearing tennis shoes instead.  She notes that has improved the pain. On examination, compression wrap in place around the right ankle.  No significant bruising noted.  No significant edema.  Mild tenderness to palpation along the posterior talofibular ligament.  Passive and active range of motion is intact.  Distal pulses are intact.  Patient is able to ambulate without significant discomfort.  Examination is consistent with a mild right ankle sprain.  Plan Patient instructed to rest, ice, continue compression, elevate her right ankle Return precautions provided
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
## 2019-10-16 NOTE — Patient Instructions (Signed)
Ms. Gonterman,  It was a pleasure seeing you in clinic. Today we discussed:    Ankle Sprain  An ankle sprain is a stretch or tear in one of the tough tissues (ligaments) that connect the bones in your ankle. An ankle sprain can happen when the ankle rolls outward (inversion sprain) or inward (eversion sprain). What are the causes? This condition is caused by rolling or twisting the ankle. What increases the risk? You are more likely to develop this condition if you play sports. What are the signs or symptoms? Symptoms of this condition include:  Pain in your ankle.  Swelling.  Bruising. This may happen right after you sprain your ankle or 1-2 days later.  Trouble standing or walking. How is this diagnosed? This condition is diagnosed with:  A physical exam. During the exam, your doctor will press on certain parts of your foot and ankle and try to move them in certain ways.  X-ray imaging. These may be taken to see how bad the sprain is and to check for broken bones. How is this treated? This condition may be treated with:  A brace or splint. This is used to keep the ankle from moving until it heals.  An elastic bandage. This is used to support the ankle.  Crutches.  Pain medicine.  Surgery. This may be needed if the sprain is very bad.  Physical therapy. This may help to improve movement in the ankle. Follow these instructions at home: If you have a brace or a splint:  Wear the brace or splint as told by your doctor. Remove it only as told by your doctor.  Loosen the brace or splint if your toes: ? Tingle. ? Lose feeling (become numb). ? Turn cold and blue.  Keep the brace or splint clean.  If the brace or splint is not waterproof: ? Do not let it get wet. ? Cover it with a watertight covering when you take a bath or a shower. If you have an elastic bandage (dressing):  Remove it to shower or bathe.  Try not to move your ankle much, but wiggle your toes from  time to time. This helps to prevent swelling.  Adjust the dressing if it feels too tight.  Loosen the dressing if your foot: ? Loses feeling. ? Tingles. ? Becomes cold and blue. Managing pain, stiffness, and swelling   Take over-the-counter and prescription medicines only as told by doctor.  For 2-3 days, keep your ankle raised (elevated) above the level of your heart.  If told, put ice on the injured area: ? If you have a removable brace or splint, remove it as told by your doctor. ? Put ice in a plastic bag. ? Place a towel between your skin and the bag. ? Leave the ice on for 20 minutes, 2-3 times a day. General instructions  Rest your ankle.  Do not use your injured leg to support your body weight until your doctor says that you can. Use crutches as told by your doctor.  Do not use any products that contain nicotine or tobacco, such as cigarettes, e-cigarettes, and chewing tobacco. If you need help quitting, ask your doctor.  Keep all follow-up visits as told by your doctor. Contact a doctor if:  Your bruises or swelling are quickly getting worse.  Your pain does not get better after you take medicine. Get help right away if:  You cannot feel your toes or foot.  Your foot or toes look blue.  You have very bad pain that gets worse. Summary  An ankle sprain is a stretch or tear in one of the tough tissues (ligaments) that connect the bones in your ankle.  This condition is caused by rolling or twisting the ankle.  Symptoms include pain, swelling, bruising, and trouble walking.  To help with pain and swelling, put ice on the injured ankle, raise your ankle above the level of your heart, and use an elastic bandage. Also, rest as told by your doctor.  Keep all follow-up visits as told by your doctor. This is important. This information is not intended to replace advice given to you by your health care provider. Make sure you discuss any questions you have with your  health care provider. Document Revised: 08/27/2017 Document Reviewed: 08/27/2017 Elsevier Patient Education  [employer].    If you have any questions or concerns, please call our clinic at [phone] between 9am-5pm and after hours call [phone] and ask for the internal medicine resident on call. If you feel you are having a medical emergency please call 911.   Thank you, we look forward to helping you remain healthy!

## 2019-10-20 NOTE — Progress Notes (Signed)
Internal Medicine Clinic Attending ° °Case discussed with Dr. Aslam  At the time of the visit.  We reviewed the resident’s history and exam and pertinent patient test results.  I agree with the assessment, diagnosis, and plan of care documented in the resident’s note.  °

## 2019-11-12 ENCOUNTER — Encounter: Payer: 59 | Admitting: Student

## 2019-11-25 ENCOUNTER — Encounter: Payer: 59 | Admitting: Student

## 2019-12-07 ENCOUNTER — Encounter: Payer: 59 | Admitting: Student

## 2019-12-07 ENCOUNTER — Other Ambulatory Visit: Payer: Self-pay

## 2019-12-07 ENCOUNTER — Ambulatory Visit (INDEPENDENT_AMBULATORY_CARE_PROVIDER_SITE_OTHER): Payer: 59 | Admitting: Internal Medicine

## 2019-12-07 ENCOUNTER — Telehealth: Payer: Self-pay | Admitting: *Deleted

## 2019-12-07 DIAGNOSIS — Z87440 Personal history of urinary (tract) infections: Secondary | ICD-10-CM

## 2019-12-07 DIAGNOSIS — N3000 Acute cystitis without hematuria: Secondary | ICD-10-CM

## 2019-12-07 DIAGNOSIS — R300 Dysuria: Secondary | ICD-10-CM | POA: Diagnosis not present

## 2019-12-07 LAB — URINALYSIS, ROUTINE W REFLEX MICROSCOPIC
Bilirubin Urine: NEGATIVE
Glucose, UA: NEGATIVE mg/dL
Hgb urine dipstick: NEGATIVE
Ketones, ur: NEGATIVE mg/dL
Nitrite: NEGATIVE
Protein, ur: NEGATIVE mg/dL
Specific Gravity, Urine: 1.017 (ref 1.005–1.030)
pH: 5 (ref 5.0–8.0)

## 2019-12-07 MED ORDER — NITROFURANTOIN MONOHYD MACRO 100 MG PO CAPS: 100.0000 mg | ORAL_CAPSULE | Freq: Two times a day (BID) | ORAL | 0 refills | Status: AC

## 2019-12-07 MED FILL — NITROFURANTOIN MONO-MCR 100: 100 | 3 days supply | Qty: 6 | Fill #0
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
## 2019-12-07 NOTE — Telephone Encounter (Signed)
Pt will have a telehealth this pm for possible u/a, clean catch urine done per dr Court Joy, will have dr Court Joy enter orders.

## 2019-12-08 ENCOUNTER — Encounter: Payer: Self-pay | Admitting: Internal Medicine

## 2019-12-08 DIAGNOSIS — N390 Urinary tract infection, site not specified: Secondary | ICD-10-CM

## 2019-12-08 DIAGNOSIS — Z87440 Personal history of urinary (tract) infections: Secondary | ICD-10-CM | POA: Insufficient documentation

## 2019-12-08 HISTORY — DX: Urinary tract infection, site not specified: N39.0

## 2019-12-08 NOTE — Progress Notes (Signed)
Internal Medicine Clinic Attending  Case discussed with Dr. Steen  At the time of the visit.  We reviewed the resident's history and pertinent patient test results.  I agree with the assessment, diagnosis, and plan of care documented in the resident's note.  

## 2019-12-08 NOTE — Assessment & Plan Note (Signed)
Patient says she has had dysuria , increased frequency , and urgency. She has also notice some flank pain on one side. Denies any hematuria. She was here earlier for a visit with her husband an UA obtained. UA and history suggest acute cystitis, symptoms not concerning for pyelonephritis. A: acute cystitis without hematuria Plan: Nitrofurantoin 100 mg BID x 3 days

## 2019-12-08 NOTE — Assessment & Plan Note (Signed)
Telephone encounter for UTI today. On review of chart and discussion with patient she has had multiple UTI this year. Will treat acute cystitis today, but noting in problem list so PCP can consider further workup if need in the future.

## 2019-12-29 ENCOUNTER — Other Ambulatory Visit: Payer: Self-pay

## 2019-12-29 ENCOUNTER — Other Ambulatory Visit: Payer: Self-pay | Admitting: Internal Medicine

## 2019-12-29 ENCOUNTER — Encounter: Payer: Self-pay | Admitting: Internal Medicine

## 2019-12-29 ENCOUNTER — Ambulatory Visit (INDEPENDENT_AMBULATORY_CARE_PROVIDER_SITE_OTHER): Payer: 59 | Admitting: Internal Medicine

## 2019-12-29 ENCOUNTER — Other Ambulatory Visit (HOSPITAL_COMMUNITY)
Admission: RE | Admit: 2019-12-29 | Discharge: 2019-12-29 | Disposition: A | Discharge status: Home / Self Care | Payer: 59 | Source: Ambulatory Visit | Attending: Internal Medicine | Admitting: Internal Medicine

## 2019-12-29 VITALS — BP 110/72 | HR 92 | Temp 98.1°F | Ht 62.0 in | Wt 213.1 lb

## 2019-12-29 DIAGNOSIS — I7789 Other specified disorders of arteries and arterioles: Secondary | ICD-10-CM

## 2019-12-29 DIAGNOSIS — K649 Unspecified hemorrhoids: Secondary | ICD-10-CM

## 2019-12-29 DIAGNOSIS — N898 Other specified noninflammatory disorders of vagina: Secondary | ICD-10-CM | POA: Diagnosis present

## 2019-12-29 DIAGNOSIS — I10 Essential (primary) hypertension: Secondary | ICD-10-CM

## 2019-12-29 DIAGNOSIS — S29011A Strain of muscle and tendon of front wall of thorax, initial encounter: Secondary | ICD-10-CM

## 2019-12-29 MED ORDER — HYDROCHLOROTHIAZIDE 25 MG PO TABS: 25.0000 mg | ORAL_TABLET | Freq: Every morning | ORAL | 1 refills | Status: DC

## 2019-12-29 MED FILL — HYDROCHLOROTHIAZIDE 25 MG T: 25 | 90 days supply | Qty: 90 | Fill #0
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
## 2019-12-29 NOTE — Patient Instructions (Addendum)
Thank you for allowing [place] to provide your care today.   I have ordered the following labs for you:  Bacterial vaginosis, yeast, basic metabolic panel   I will call if any are abnormal.    For your constipation please drink lots of water. You can also try metamucil to increase fiber or Miralax to help increase bowel movements.   Please follow-up in six months for blood pressure check and labs.    Please call the internal medicine center clinic if you have any questions or concerns, we may be able to help and keep you from a long and expensive emergency room wait. Our clinic and after hours phone number is [phone], the best time to call is Monday through Friday 9 am to 4 pm but there is always someone available 24/7 if you have an emergency. If you need medication refills please notify your pharmacy one week in advance and they will send [place] a request.

## 2019-12-30 LAB — BMP8+ANION GAP
Anion Gap: 15 mmol/L (ref 10.0–18.0)
BUN/Creatinine Ratio: 16 (ref 12–28)
BUN: 12 mg/dL (ref 8–27)
CO2: 23 mmol/L (ref 20–29)
Calcium: 9.8 mg/dL (ref 8.7–10.3)
Chloride: 105 mmol/L (ref 96–106)
Creatinine, Ser: 0.77 mg/dL (ref 0.57–1.00)
GFR calc Af Amer: 96 mL/min/{1.73_m2} (ref >59)
GFR calc non Af Amer: 84 mL/min/{1.73_m2} (ref >59)
Glucose: 110 mg/dL — ABNORMAL HIGH (ref 65–99)
Potassium: 4 mmol/L (ref 3.5–5.2)
Sodium: 143 mmol/L (ref 134–144)

## 2019-12-30 LAB — CERVICOVAGINAL ANCILLARY ONLY
Bacterial Vaginitis (gardnerella): NEGATIVE
Candida Glabrata: NEGATIVE
Candida Vaginitis: NEGATIVE
Comment: NEGATIVE
Comment: NEGATIVE
Comment: NEGATIVE
Comment: NEGATIVE
Trichomonas: NEGATIVE

## 2019-12-30 NOTE — Assessment & Plan Note (Signed)
BP Readings from Last 3 Encounters:  12/29/19 110/72  10/16/19 132/64  08/26/19 131/83    Blood pressure well controlled. Current medications include hydrochlorothiazide 25 mg qam.  - continue HCTZ - BMP today

## 2019-12-30 NOTE — Assessment & Plan Note (Signed)
She has been having constipation recently although this has improved with drinking more water. She had one episode of small amount of bright red blood with pain. No dark tarry stools, recurrence of BRB, weakness, dizziness. She has lost about 7 lbs but states she has drastically changed her diet to includes more salads and cut back on processed foods. She is not due for another colonoscopy until next year.   - likely external hemorrhoid, provided education on hemorrhoids and recommendations to prevent constipation.

## 2019-12-30 NOTE — Assessment & Plan Note (Addendum)
She is no longer having symptoms of UTI but is having abnormal smell and white discharge. Vagina often feels dry. She is using soap daily to clean around and within the vaginal area. She is not sexually active and has not been in years.  Symptoms consistent with possible bacterial vaginosis vs. Yeast infection. Pelvic exam demonstrates likely BV. She is not due for pap for another two years. No vaginal atrophy or dryness on exam.   - dryness likely secondary to excessive soap. Advised her to only use soap around the vagina and to avoid using this within the labia and use water only - pelvic exam with labs for BV, trichomonas, and yeast - will call with results and medication   ADDENDUM: cervical swab negative. Discussed results with her. She endorses resolution of symptoms with stopping use of soap within the vaginal area.

## 2019-12-31 NOTE — Progress Notes (Signed)
Internal Medicine Clinic Attending  Case discussed with Dr. Seawell  At the time of the visit.  We reviewed the resident's history and exam and pertinent patient test results.  I agree with the assessment, diagnosis, and plan of care documented in the resident's note.  

## 2020-01-07 MED FILL — HYDROCHLOROTHIAZIDE 25 MG T: 25 | 90 days supply | Qty: 90 | Fill #0

## 2020-03-16 ENCOUNTER — Ambulatory Visit (INDEPENDENT_AMBULATORY_CARE_PROVIDER_SITE_OTHER): Payer: Self-pay | Admitting: Internal Medicine

## 2020-03-16 ENCOUNTER — Encounter: Payer: Self-pay | Admitting: Internal Medicine

## 2020-03-16 ENCOUNTER — Other Ambulatory Visit: Payer: Self-pay

## 2020-03-16 VITALS — BP 133/64 | HR 92 | Temp 97.9°F | Ht 62.0 in | Wt 218.5 lb

## 2020-03-16 DIAGNOSIS — R011 Cardiac murmur, unspecified: Secondary | ICD-10-CM

## 2020-03-16 DIAGNOSIS — N898 Other specified noninflammatory disorders of vagina: Secondary | ICD-10-CM

## 2020-03-16 DIAGNOSIS — I10 Essential (primary) hypertension: Secondary | ICD-10-CM

## 2020-03-16 DIAGNOSIS — N939 Abnormal uterine and vaginal bleeding, unspecified: Secondary | ICD-10-CM
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
## 2020-03-16 NOTE — Assessment & Plan Note (Signed)
S3 vs. Consistent split. No chest pain, palpitations or SOB.   - ECG normal, unchanged from prior

## 2020-03-16 NOTE — Patient Instructions (Signed)
Your ECG was normal.   Please follow-up in three months for labs.    Should you have any questions or concerns please call the internal medicine clinic at [phone].

## 2020-03-16 NOTE — Assessment & Plan Note (Signed)
She had one episode of pink tinged vaginal spotting. This has not happened previously that she can remember although she has had a history of blood after urinating earlier in the year. She denies, dysuria, nausea, abnormal vaginal discharge, vaginal pain, weight loss. 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  She has a history of uterine fibroids s/p partial hysterectomy. She last had a normal pap smear three years ago and is due again in 2022. She also had vaginal irritation three months ago, vaginal exam and tests were done which were negative for acute infection or physical exam findings.   - UA now  - f/u if symptoms recur

## 2020-03-16 NOTE — Assessment & Plan Note (Signed)
BP Readings from Last 3 Encounters:  03/16/20 133/64  12/29/19 110/72  10/16/19 132/64  - blood pressure well controlled with HCTZ. Continue current dose.  - f/u three months for labs

## 2020-03-17 LAB — URINALYSIS, ROUTINE W REFLEX MICROSCOPIC
Bilirubin, UA: NEGATIVE
Glucose, UA: NEGATIVE
Ketones, UA: NEGATIVE
Nitrite, UA: NEGATIVE
Protein,UA: NEGATIVE
RBC, UA: NEGATIVE
Specific Gravity, UA: 1.019 (ref 1.005–1.030)
Urobilinogen, Ur: 0.2 mg/dL (ref 0.2–1.0)
pH, UA: 5 (ref 5.0–7.5)

## 2020-03-17 LAB — MICROSCOPIC EXAMINATION
Casts: NONE SEEN /lpf
RBC, Urine: NONE SEEN /hpf (ref 0–2)
WBC, UA: >30 /hpf — AB (ref 0–5)

## 2020-03-18 NOTE — Progress Notes (Signed)
Internal Medicine Clinic Attending  Case discussed with Dr. Seawell  At the time of the visit.  We reviewed the resident's history and exam and pertinent patient test results.  I agree with the assessment, diagnosis, and plan of care documented in the resident's note.  

## 2020-03-18 NOTE — Progress Notes (Deleted)
Still worried about the discharge - U/A with no signs of blood/RBC. Would consider referral to gyn for follow up given she has an intact cervix

## 2020-03-21 NOTE — Addendum Note (Signed)
Addended by: Molli Hazard A on: 03/21/2020 10:11 AM   Modules accepted: Orders

## 2020-04-11 ENCOUNTER — Telehealth: Payer: Self-pay | Admitting: *Deleted

## 2020-04-11 DIAGNOSIS — E119 Type 2 diabetes mellitus without complications: Secondary | ICD-10-CM | POA: Insufficient documentation

## 2020-04-11 DIAGNOSIS — I10 Essential (primary) hypertension: Secondary | ICD-10-CM | POA: Insufficient documentation

## 2020-04-11 DIAGNOSIS — Z91040 Latex allergy status: Secondary | ICD-10-CM | POA: Insufficient documentation

## 2020-04-11 DIAGNOSIS — R110 Nausea: Secondary | ICD-10-CM | POA: Insufficient documentation

## 2020-04-11 DIAGNOSIS — Z8616 Personal history of COVID-19: Secondary | ICD-10-CM | POA: Insufficient documentation

## 2020-04-11 DIAGNOSIS — Z7982 Long term (current) use of aspirin: Secondary | ICD-10-CM | POA: Insufficient documentation

## 2020-04-11 DIAGNOSIS — N390 Urinary tract infection, site not specified: Secondary | ICD-10-CM | POA: Insufficient documentation

## 2020-04-11 DIAGNOSIS — Z79899 Other long term (current) drug therapy: Secondary | ICD-10-CM | POA: Insufficient documentation

## 2020-04-11 NOTE — Telephone Encounter (Signed)
Agree.  She can also take over the counter pepto bismol or other stomach soothing medication.  She should call back if nausea, vomiting, diarrhea or inability to take PO.   Thanks.

## 2020-04-11 NOTE — Telephone Encounter (Signed)
Pt states she went out to eat to a seafood restaurant and afterwards she started to have this crampy stomach, denies N&V, diarrhea. States her friend c/o same.  She ask what she can do and is given BRAT instructions and told to gradually increase back to normal diet, do you agree?

## 2020-04-12 ENCOUNTER — Emergency Department (HOSPITAL_COMMUNITY)
Admission: EM | Admit: 2020-04-12 | Discharge: 2020-04-12 | Disposition: A | Discharge status: Home / Self Care | Payer: Self-pay | Attending: Emergency Medicine | Admitting: Emergency Medicine

## 2020-04-12 ENCOUNTER — Other Ambulatory Visit (HOSPITAL_COMMUNITY): Payer: Self-pay | Admitting: Emergency Medicine

## 2020-04-12 ENCOUNTER — Encounter (HOSPITAL_COMMUNITY): Payer: Self-pay

## 2020-04-12 DIAGNOSIS — N390 Urinary tract infection, site not specified: Secondary | ICD-10-CM

## 2020-04-12 LAB — URINALYSIS, ROUTINE W REFLEX MICROSCOPIC
Bilirubin Urine: NEGATIVE
Glucose, UA: NEGATIVE mg/dL
Hgb urine dipstick: NEGATIVE
Ketones, ur: NEGATIVE mg/dL
Nitrite: NEGATIVE
Protein, ur: NEGATIVE mg/dL
Specific Gravity, Urine: 1.026 (ref 1.005–1.030)
pH: 5 (ref 5.0–8.0)

## 2020-04-12 LAB — CBC
HCT: 41.5 % (ref 36.0–46.0)
Hemoglobin: 13.5 g/dL (ref 12.0–15.0)
MCH: 28.9 pg (ref 26.0–34.0)
MCHC: 32.5 g/dL (ref 30.0–36.0)
MCV: 88.9 fL (ref 80.0–100.0)
Platelets: 299 10*3/uL (ref 150–400)
RBC: 4.67 MIL/uL (ref 3.87–5.11)
RDW: 14.3 % (ref 11.5–15.5)
WBC: 8.2 10*3/uL (ref 4.0–10.5)
nRBC: 0 % (ref 0.0–0.2)

## 2020-04-12 LAB — LIPASE, BLOOD: Lipase: 22 U/L (ref 11–51)

## 2020-04-12 LAB — COMPREHENSIVE METABOLIC PANEL
ALT: 24 U/L (ref 0–44)
AST: 20 U/L (ref 15–41)
Albumin: 3.9 g/dL (ref 3.5–5.0)
Alkaline Phosphatase: 77 U/L (ref 38–126)
Anion gap: 10 (ref 5–15)
BUN: 13 mg/dL (ref 8–23)
CO2: 24 mmol/L (ref 22–32)
Calcium: 9 mg/dL (ref 8.9–10.3)
Chloride: 106 mmol/L (ref 98–111)
Creatinine, Ser: 0.81 mg/dL (ref 0.44–1.00)
GFR, Estimated: >60 mL/min (ref >60)
Glucose, Bld: 145 mg/dL — ABNORMAL HIGH (ref 70–99)
Potassium: 3.9 mmol/L (ref 3.5–5.1)
Sodium: 140 mmol/L (ref 135–145)
Total Bilirubin: 0.7 mg/dL (ref 0.3–1.2)
Total Protein: 7.5 g/dL (ref 6.5–8.1)

## 2020-04-12 MED ORDER — DICYCLOMINE HCL 20 MG PO TABS: 20.0000 mg | ORAL_TABLET | Freq: Two times a day (BID) | ORAL | 0 refills | Status: DC

## 2020-04-12 MED ORDER — ONDANSETRON 4 MG PO TBDP: 4.0000 mg | ORAL_TABLET | Freq: Three times a day (TID) | ORAL | 0 refills | Status: DC | PRN

## 2020-04-12 MED ORDER — FAMOTIDINE 20 MG PO TABS: 20.0000 mg | ORAL_TABLET | Freq: Two times a day (BID) | ORAL | 0 refills | Status: DC

## 2020-04-12 MED ORDER — NITROFURANTOIN MONOHYD MACRO 100 MG PO CAPS: 100.0000 mg | ORAL_CAPSULE | Freq: Two times a day (BID) | ORAL | 0 refills | Status: DC

## 2020-04-12 MED FILL — HYDROCHLOROTHIAZIDE 25 MG T: 25 | 90 days supply | Qty: 90 | Fill #1

## 2020-04-12 MED FILL — DICYCLOMINE 20 MG TABLET: 20 | 10 days supply | Qty: 20 | Fill #0

## 2020-04-12 MED FILL — NITROFURANTOIN MONO-MCR 100: 100 | 5 days supply | Qty: 10 | Fill #0

## 2020-04-12 MED FILL — FAMOTIDINE 20 MG TABS: 20 | 5 days supply | Qty: 10 | Fill #0

## 2020-04-12 MED FILL — ONDANSETRON ODT 4 MG TABLET: 4 | 4 days supply | Qty: 10 | Fill #0

## 2020-04-12 MED FILL — ATORVASTATIN 40 MG TABLET: 40 | 90 days supply | Qty: 90 | Fill #1

## 2020-04-12 NOTE — ED Notes (Signed)
Discharge paperwork reviewed with pt, including prescriptions. Pt verbalized understanding, with no questions or concerns at time of discharge, ambulatory to ED exit.

## 2020-04-12 NOTE — Discharge Instructions (Addendum)
You may purchase boric acid suppositories over the  counter at [employer] [facility] [facility] in [place]. Use one tablet IN YOUR VAGINA at night before bed every other night for the first week. You may use it once a week after that. DO NOT TAKE BY MOUTH!   Contact a health care provider if: Your symptoms do not get better after 1-2 days. Your symptoms go away and then return. Get help right away if you have: Severe pain in your back or your lower abdomen. A fever. Nausea or vomiting.

## 2020-04-12 NOTE — ED Triage Notes (Signed)
Pt states that she ate seafood on Thursday night and started hurting in the center of her abdomen, then she cooked for Christmas and thinks she may have eaten bad food then Pt states she made herself vomit after Thursday ad hasn't vomited since then Pt states that it feels better to burp
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
## 2020-05-23 ENCOUNTER — Other Ambulatory Visit: Payer: Self-pay | Admitting: Student

## 2020-05-23 ENCOUNTER — Other Ambulatory Visit: Payer: Self-pay

## 2020-05-23 DIAGNOSIS — E782 Mixed hyperlipidemia: Secondary | ICD-10-CM

## 2020-05-23 DIAGNOSIS — R059 Cough, unspecified: Secondary | ICD-10-CM

## 2020-05-23 MED ORDER — ATORVASTATIN CALCIUM 40 MG PO TABS: 40.0000 mg | ORAL_TABLET | Freq: Every day | ORAL | 1 refills | Status: DC

## 2020-05-23 NOTE — Telephone Encounter (Signed)
Pt is requesting her guaifenesin (ROBITUSSIN) 100 MG/5ML syrup   atorvastatin (LIPITOR) 40 MG tablet, sent to  [place], [place] - [address] [facility]. Phone:  [phone]  Fax:  [phone]

## 2020-05-24 ENCOUNTER — Telehealth: Payer: Self-pay | Admitting: *Deleted

## 2020-05-24 ENCOUNTER — Other Ambulatory Visit: Payer: Self-pay | Admitting: Student

## 2020-05-24 DIAGNOSIS — R059 Cough, unspecified: Secondary | ICD-10-CM

## 2020-05-24 MED FILL — ATORVASTATIN 40 MG TABLET: 40 | 30 days supply | Qty: 30 | Fill #0

## 2020-05-24 NOTE — Telephone Encounter (Signed)
Called / informed pt either Pfizer or Moderna booster per Dr Wynetta Emery - pt stated thank you.

## 2020-05-24 NOTE — Telephone Encounter (Signed)
Pt's calling about which booster to take; states she had the J&J vaccine. Inform pt she can take either Moderna or Coca-Cola. Is this correct? Pt states she wants to be sure, told her I will ask her doctor. Then she states she something  Danielle Mason to talk to her doctor about but she would not give me any details. But I told her I will pass along her message.

## 2020-05-24 NOTE — Telephone Encounter (Signed)
Agree with recommendation for patient to obtain Moderna or Coca-Cola booster.

## 2020-06-01 ENCOUNTER — Ambulatory Visit (INDEPENDENT_AMBULATORY_CARE_PROVIDER_SITE_OTHER): Payer: 59 | Admitting: Obstetrics & Gynecology

## 2020-06-01 ENCOUNTER — Other Ambulatory Visit: Payer: Self-pay

## 2020-06-01 ENCOUNTER — Other Ambulatory Visit (HOSPITAL_COMMUNITY)
Admission: RE | Admit: 2020-06-01 | Discharge: 2020-06-01 | Disposition: A | Discharge status: Home / Self Care | Payer: 59 | Source: Ambulatory Visit | Attending: Obstetrics & Gynecology | Admitting: Obstetrics & Gynecology

## 2020-06-01 ENCOUNTER — Encounter: Payer: Self-pay | Admitting: Obstetrics & Gynecology

## 2020-06-01 VITALS — BP 126/78 | HR 94 | Ht 62.0 in | Wt 221.2 lb

## 2020-06-01 DIAGNOSIS — N898 Other specified noninflammatory disorders of vagina: Secondary | ICD-10-CM | POA: Diagnosis present

## 2020-06-01 DIAGNOSIS — N9089 Other specified noninflammatory disorders of vulva and perineum: Secondary | ICD-10-CM | POA: Diagnosis not present

## 2020-06-01 DIAGNOSIS — Z87440 Personal history of urinary (tract) infections: Secondary | ICD-10-CM | POA: Diagnosis not present

## 2020-06-01 DIAGNOSIS — N952 Postmenopausal atrophic vaginitis: Secondary | ICD-10-CM | POA: Diagnosis not present

## 2020-06-01 MED ORDER — ESTRADIOL 0.1 MG/GM VA CREA: 2.0000 g | TOPICAL_CREAM | Freq: Every day | VAGINAL | 2 refills | Status: DC

## 2020-06-01 NOTE — Patient Instructions (Signed)
Cetaphil Soap  Pat dry after urinating  Hair dryer on cool to dry after shower.  No vigorous towel rubbing.

## 2020-06-01 NOTE — Progress Notes (Signed)
Pt states only had bloody show once & that's it. Dryness in vagina, in January was feeling nauseated a lot. Concerned of getting  UTI's continuously.

## 2020-06-01 NOTE — Progress Notes (Signed)
   Subjective:    Patient ID: [name], female    DOB: 10/16/1960, 60 y.o.   MRN: [id number]  HPI  60 yo female presents for external irritation and recurrent utis.  Pt was told not to use soap which concerns her.  She admits to wiping vigorously both after using the bathroom and showering.  She does feel irritated.  She is sexually active but not often.  Her husband has health issues.  She denies post menopausal bleeding.  She denies dysuria and frequency currently.    Review of Systems  Constitutional: Negative.   Respiratory: Negative.   Cardiovascular: Negative.   Gastrointestinal: Negative.   Genitourinary: Positive for pelvic pain. Negative for dyspareunia, urgency and vaginal bleeding.       Irritation at introitus       Objective:   Physical Exam Vitals reviewed.  Constitutional:      General: She is not in acute distress.    Appearance: She is well-developed and well-nourished.  HENT:     Head: Normocephalic and atraumatic.  Eyes:     Conjunctiva/sclera: Conjunctivae normal.  Cardiovascular:     Rate and Rhythm: Normal rate.  Pulmonary:     Effort: Pulmonary effort is normal.  Genitourinary:    Comments: Tanner V Sebaceous cyst inferior to vulva (see picture) Posterior urethra red  As well as fourchette (see picture) Musculoskeletal:        General: No edema.  Skin:    General: Skin is warm and dry.  Neurological:     Mental Status: She is alert and oriented to person, place, and time.  Psychiatric:        Mood and Affect: Mood and affect and mood normal.        Behavior: Behavior normal.     Vitals:   06/01/20 1410  BP: 126/78  Pulse: 94  Weight: 221 lb 3.2 oz (100.3 kg)  Height: 5\' 2"  (1.575 m)      Assessment & Plan:  60 yo female with frequent UTIs and irritation at introitus. 1.  Reviewed vulvar hygiene.  Cetaphil soap.  Hair dryer on cool.  Pat dry after urinating. 2.  Estrace to urethra and introitus.   3.  RTC in 4 weeks for re  evaluation.  I not better, consider steroids or biopsy.  4.  Aptima sent today for vaginal discharge 5.  Printed Good Rx for Estrace cream to [facility]. 6.  Pt will come to [facility] for next visit with me.  7.  Stop Boric acid  45 minutes spent with patient, review of records, documentation, coordination of care, and counseling.

## 2020-06-02 LAB — CERVICOVAGINAL ANCILLARY ONLY
Bacterial Vaginitis (gardnerella): NEGATIVE
Candida Glabrata: NEGATIVE
Candida Vaginitis: NEGATIVE
Chlamydia: NEGATIVE
Comment: NEGATIVE
Comment: NEGATIVE
Comment: NEGATIVE
Comment: NEGATIVE
Comment: NEGATIVE
Comment: NORMAL
Neisseria Gonorrhea: NEGATIVE
Trichomonas: NEGATIVE

## 2020-06-02 MED FILL — ATORVASTATIN 40 MG TABLET: 40 | 30 days supply | Qty: 30 | Fill #0

## 2020-06-03 ENCOUNTER — Telehealth: Payer: Self-pay | Admitting: Family Medicine

## 2020-06-03 ENCOUNTER — Other Ambulatory Visit: Payer: Self-pay | Admitting: Lactation Services

## 2020-06-03 MED ORDER — ESTRADIOL 0.1 MG/GM VA CREA: 2.0000 g | TOPICAL_CREAM | Freq: Every day | VAGINAL | 2 refills | Status: DC

## 2020-06-03 NOTE — Progress Notes (Signed)
Medication not received at Pharmacy, reordered in normal mode to be transmitted to Pharmacy. Patient called and informed.

## 2020-06-03 NOTE — Telephone Encounter (Signed)
Pt states that Danielle Mason never received her prescription 05-31-20.Marland KitchenMarland KitchenBUT she wants the prescription sent to [facility].

## 2020-06-21 ENCOUNTER — Other Ambulatory Visit: Payer: Self-pay | Admitting: Internal Medicine

## 2020-06-21 DIAGNOSIS — M170 Bilateral primary osteoarthritis of knee: Secondary | ICD-10-CM

## 2020-06-24 ENCOUNTER — Encounter: Payer: 59 | Admitting: Internal Medicine

## 2020-06-27 ENCOUNTER — Other Ambulatory Visit: Payer: Self-pay | Admitting: Obstetrics & Gynecology

## 2020-06-27 ENCOUNTER — Other Ambulatory Visit: Payer: Self-pay

## 2020-06-27 ENCOUNTER — Ambulatory Visit (INDEPENDENT_AMBULATORY_CARE_PROVIDER_SITE_OTHER): Payer: 59 | Admitting: Obstetrics & Gynecology

## 2020-06-27 ENCOUNTER — Encounter: Payer: Self-pay | Admitting: Obstetrics & Gynecology

## 2020-06-27 VITALS — BP 138/78 | HR 100 | Resp 16 | Ht 62.0 in | Wt 219.0 lb

## 2020-06-27 DIAGNOSIS — N7689 Other specified inflammation of vagina and vulva: Secondary | ICD-10-CM

## 2020-06-27 DIAGNOSIS — N898 Other specified noninflammatory disorders of vagina: Secondary | ICD-10-CM

## 2020-06-27 DIAGNOSIS — N342 Other urethritis: Secondary | ICD-10-CM | POA: Diagnosis not present

## 2020-06-27 NOTE — Progress Notes (Signed)
° °  Subjective:    Patient ID: Danielle Mason, female    DOB: [birth year], 60 y.o.   MRN: [id number]  HPI  Pt presents for f/u of vaginal dryness and to evaluate urethra and fourchette after estrogen placement.  Moisture has improved.  No bleeding, discharge, pain, itching  Review of Systems  Constitutional: Negative.   Respiratory: Negative.   Cardiovascular: Negative.   Gastrointestinal: Negative.   Genitourinary: Negative.        Objective:   Physical Exam Vitals reviewed.  Constitutional:      General: She is not in acute distress.    Appearance: She is well-developed.  HENT:     Head: Normocephalic and atraumatic.  Eyes:     Conjunctiva/sclera: Conjunctivae normal.  Cardiovascular:     Rate and Rhythm: Normal rate.  Pulmonary:     Effort: Pulmonary effort is normal.  Genitourinary:    Comments: Redness at urethra and fourchette still present.  Some thickness of mucosa. Skin:    General: Skin is warm and dry.  Neurological:     Mental Status: She is alert and oriented to person, place, and time.  Psychiatric:        Mood and Affect: Mood normal.    Vitals:   06/27/20 1113  BP: 138/78  Pulse: 100  Resp: 16  Weight: 219 lb (99.3 kg)  Height: 5\' 2"  (1.575 m)      Assessment & Plan:  60 yo female with vaginal and periurethral redness  1.  Punch biopsy of vagina.  VAGINAL BIOPSY NOTE  The indications for vulvar biopsy (rule out neoplasia, establish lichen planus diagnosis) were reviewed.  Risks of the biopsy including pain, bleeding, infection, inadequate specimen, scarring and need for additional procedures  were discussed. The patient stated understanding and agreed to undergo procedure today. Consent was signed,  time out performed.  The patient's vagina was prepped with Betadine. 1% lidocaine was injected into fourchette & periurethral area. A 3-mm punch biopsy was taken from the fourchette, biopsy tissue was picked up with sterile forceps and sterile  scissors were used to excise the lesion.  Small bleeding was noted and hemostasis was achieved using silver nitrate sticks.  Due to the bleeding from the fourchette biopsy, it was decided to not take the biopsy of the periurethral area and refer to urology.  The patient tolerated the procedure well. Post-procedure instructions  (pelvic rest for one week) were given to the patient. The patient is to call with heavy bleeding, fever greater than 100.4, foul smelling vaginal discharge or other concerns. The patient will be return to clinic in two weeks for discussion of results.  Will base treatment on results.  Referral to urology for urethral evaluation.  30 minutes was spent with patient during interview and counseling, discussion with husband and documentation.  Vaginal biopsy was separate procedure.

## 2020-06-30 ENCOUNTER — Telehealth (HOSPITAL_BASED_OUTPATIENT_CLINIC_OR_DEPARTMENT_OTHER): Payer: 59 | Admitting: Obstetrics & Gynecology

## 2020-06-30 DIAGNOSIS — Z712 Person consulting for explanation of examination or test findings: Secondary | ICD-10-CM | POA: Diagnosis not present

## 2020-06-30 DIAGNOSIS — N7689 Other specified inflammation of vagina and vulva: Secondary | ICD-10-CM

## 2020-06-30 DIAGNOSIS — N342 Other urethritis: Secondary | ICD-10-CM

## 2020-07-01 ENCOUNTER — Encounter: Payer: Self-pay | Admitting: Internal Medicine

## 2020-07-01 ENCOUNTER — Other Ambulatory Visit: Payer: Self-pay | Admitting: Internal Medicine

## 2020-07-01 ENCOUNTER — Ambulatory Visit (INDEPENDENT_AMBULATORY_CARE_PROVIDER_SITE_OTHER): Payer: 59 | Admitting: Internal Medicine

## 2020-07-01 ENCOUNTER — Other Ambulatory Visit: Payer: Self-pay

## 2020-07-01 VITALS — BP 121/62 | HR 85 | Temp 98.0°F | Ht 62.0 in | Wt 215.8 lb

## 2020-07-01 DIAGNOSIS — I10 Essential (primary) hypertension: Secondary | ICD-10-CM

## 2020-07-01 DIAGNOSIS — R7303 Prediabetes: Secondary | ICD-10-CM | POA: Diagnosis not present

## 2020-07-01 DIAGNOSIS — E119 Type 2 diabetes mellitus without complications: Secondary | ICD-10-CM

## 2020-07-01 DIAGNOSIS — Z8709 Personal history of other diseases of the respiratory system: Secondary | ICD-10-CM | POA: Diagnosis not present

## 2020-07-01 DIAGNOSIS — M170 Bilateral primary osteoarthritis of knee: Secondary | ICD-10-CM | POA: Diagnosis not present

## 2020-07-01 DIAGNOSIS — Z87898 Personal history of other specified conditions: Secondary | ICD-10-CM | POA: Insufficient documentation

## 2020-07-01 LAB — POCT GLYCOSYLATED HEMOGLOBIN (HGB A1C): Hemoglobin A1C: 6.5 % — AB (ref 4.0–5.6)

## 2020-07-01 LAB — GLUCOSE, CAPILLARY: Glucose-Capillary: 181 mg/dL — ABNORMAL HIGH (ref 70–99)

## 2020-07-01 MED ORDER — IBUPROFEN 600 MG PO TABS: 600.0000 mg | ORAL_TABLET | Freq: Three times a day (TID) | ORAL | 0 refills | Status: DC | PRN

## 2020-07-01 MED ORDER — HYDROCHLOROTHIAZIDE 25 MG PO TABS: 25.0000 mg | ORAL_TABLET | Freq: Every morning | ORAL | 1 refills | Status: DC
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
## 2020-07-01 NOTE — Assessment & Plan Note (Signed)
She is here today for refill of ibuprofen she takes for mild arthritis of both knees. The pain is infrequent and she only takes the ibuprofen 600 mg once every 1-3 weeks, which helps relieve her pain when tylenol does not help. She also uses voltaren gel as needed. The pain does not limit her activities of daily living.  - refill ibuprofen

## 2020-07-01 NOTE — Patient Instructions (Signed)
Thank you for allowing [place] to provide your care today. Today we discussed your knee arthritis and prediabetes.   I have ordered the following labs for you:  Hemoglobin a1c   I will call if any are abnormal.    Today we made the following changes to your medications:   Please follow-up in six months for labs.    Please call the internal medicine center clinic if you have any questions or concerns, we may be able to help and keep you from a long and expensive emergency room wait. Our clinic and after hours phone number is [phone], the best time to call is Monday through Friday 9 am to 4 pm but there is always someone available 24/7 if you have an emergency. If you need medication refills please notify your pharmacy one week in advance and they will send [place] a request.

## 2020-07-01 NOTE — Assessment & Plan Note (Signed)
BP Readings from Last 3 Encounters:  07/01/20 121/62  06/27/20 138/78  06/01/20 126/78   Blood pressure well controlled on HCTZ 25 mg qd. Renal function stable 3 months ago.   - f/u in 6 months for blood pressure check and BMP - cont. HCTZ 25 mg qd

## 2020-07-01 NOTE — Assessment & Plan Note (Signed)
  History of occasional cough. She does not currently have cough but states in the past tussionex helped and she was wondering if she could have a refill for this in case she has a cough in the future. Stated that cannot prescribe this prophylactically, but if she has return of cough we would likely be able to refill this if it is appropriate for her symptoms.  - f/u if cough recurs.

## 2020-07-02 ENCOUNTER — Encounter: Payer: Self-pay | Admitting: Obstetrics & Gynecology

## 2020-07-02 MED ORDER — BETAMETHASONE DIPROPIONATE AUG 0.05 % EX OINT: TOPICAL_OINTMENT | CUTANEOUS | 0 refills | Status: DC
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
## 2020-07-03 NOTE — Progress Notes (Signed)
Internal Medicine Clinic Attending  Case discussed with Dr. Seawell at the time of the visit.  We reviewed the resident's history and exam and pertinent patient test results.  I agree with the assessment, diagnosis, and plan of care documented in the resident's note.  Alexander Raines, M.D., Ph.D.  

## 2020-07-06 ENCOUNTER — Ambulatory Visit (INDEPENDENT_AMBULATORY_CARE_PROVIDER_SITE_OTHER): Payer: 59 | Admitting: Obstetrics and Gynecology

## 2020-07-06 ENCOUNTER — Telehealth: Payer: Self-pay

## 2020-07-06 ENCOUNTER — Other Ambulatory Visit: Payer: Self-pay

## 2020-07-06 ENCOUNTER — Encounter: Payer: Self-pay | Admitting: Obstetrics and Gynecology

## 2020-07-06 VITALS — BP 116/73 | HR 80 | Resp 16 | Ht 62.0 in | Wt 219.0 lb

## 2020-07-06 DIAGNOSIS — Z5189 Encounter for other specified aftercare: Secondary | ICD-10-CM

## 2020-07-06 MED FILL — HYDROCHLOROTHIAZIDE 25 MG T: 25 | 90 days supply | Qty: 90 | Fill #0

## 2020-07-06 NOTE — Telephone Encounter (Signed)
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  Results given to patient, she states she had just eaten before labs were drawn.  RN informed patient that this would not have interfered with A1C results.  RN informed patient that MD tried contacting her yesterday to discuss.  She is asking for MD to call her tomorrow before 1pm.  She is very Patent attorney. Forwarding to Dr. Sharon Seller. SChaplin, RN,BSN

## 2020-07-06 NOTE — Progress Notes (Signed)
Danielle Mason underwent a vulvar punch biopsy on 3/14 with Dr. Gala Romney. Biopsy showed vaginal inflammation. She was prescribed a steroid cream and requested to come into the office for demonstration of steroid cream use.  Her husband is present today with her.   GENERAL: Well-developed, well-nourished female in no acute distress.  LUNGS: Effort normal SKIN: Warm, dry and without erythema GU: Patient placed in dorsal position; pea size amount of steroid cream placed on fourchette and periurethral area. Area without discharge or odor, appears to be healing well.  PSYCH: Normal mood and affect  1. Visit for wound check  Partner present for wound check and application of steroid cream. Demonstrated wound care to partner and all questions answered.  F/u with Dr. Gala Romney as scheduled.    Lezlie Lye, NP 07/08/2020 11:31 AM

## 2020-07-07 NOTE — Telephone Encounter (Signed)
Tried to call patient again before 1pm, no answer, VM not setup.

## 2020-07-07 NOTE — Addendum Note (Signed)
Addended by: Molli Hazard A on: 07/07/2020 10:35 AM   Modules accepted: Orders

## 2020-07-07 NOTE — Assessment & Plan Note (Signed)
ADDENDUM: A1c 6.5 today. Has been elevated as high as 6.9 in 2020, but she controlled her diabetes with diet alone. Discussed results today, and she feels strongly she can improve her diabetes again with diet. Discussed foods to to avoid, exercise, increased risk factors with diabetes. She would like to meet with our RD as well.   - referral placed to [facility] - referral to optho - f/u in three months for a1c

## 2020-07-12 ENCOUNTER — Ambulatory Visit (INDEPENDENT_AMBULATORY_CARE_PROVIDER_SITE_OTHER): Payer: Self-pay | Admitting: Internal Medicine

## 2020-07-12 VITALS — BP 133/92 | HR 73 | Temp 97.7°F | Wt 211.7 lb

## 2020-07-12 DIAGNOSIS — E119 Type 2 diabetes mellitus without complications: Secondary | ICD-10-CM

## 2020-07-12 NOTE — Assessment & Plan Note (Signed)
Here today to discuss her elevated a1c. She has history of diabetes which is controlled through diet alone. A1c 11/2018 was 6.9 and had decreased to 5.7 06/2019. Discussed risks and benefits of starting medication, and she feels that she can improve her a1c again through diet alone.   - discussed proper diet and exercise - discussed diagnosis of diabetes - she would like to be able to consider herself prediabetes again  - referral placed to optho during last visit.  - f/u in three months for a1c
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
## 2020-07-12 NOTE — Patient Instructions (Signed)
Thank you for allowing [place] to provide your care today.   Please follow-up in three months for hemoglobin a1c check.     Please call the internal medicine center clinic if you have any questions or concerns, we may be able to help and keep you from a long and expensive emergency room wait. Our clinic and after hours phone number is [phone], the best time to call is Monday through Friday 9 am to 4 pm but there is always someone available 24/7 if you have an emergency. If you need medication refills please notify your pharmacy one week in advance and they will send [place] a request.

## 2020-07-16 NOTE — Progress Notes (Signed)
Internal Medicine Clinic Attending  Case discussed with Dr. Seawell  At the time of the visit.  We reviewed the resident's history and exam and pertinent patient test results.  I agree with the assessment, diagnosis, and plan of care documented in the resident's note.  

## 2020-07-25 ENCOUNTER — Ambulatory Visit (INDEPENDENT_AMBULATORY_CARE_PROVIDER_SITE_OTHER): Payer: 59 | Admitting: Dietician

## 2020-07-25 ENCOUNTER — Encounter: Payer: Self-pay | Admitting: Dietician

## 2020-07-25 DIAGNOSIS — Z6838 Body mass index (BMI) 38.0-38.9, adult: Secondary | ICD-10-CM | POA: Diagnosis not present

## 2020-07-25 DIAGNOSIS — Z713 Dietary counseling and surveillance: Secondary | ICD-10-CM

## 2020-07-25 DIAGNOSIS — E119 Type 2 diabetes mellitus without complications: Secondary | ICD-10-CM | POA: Diagnosis not present

## 2020-07-25 LAB — GLUCOSE, CAPILLARY: Glucose-Capillary: 91 mg/dL (ref 70–99)

## 2020-07-25 NOTE — Patient Instructions (Addendum)
The prediabetes range for A1C is 5.7 to 6.4%. You last A1C was 6.5% done on July 01, 2020. So it may be repeated in the next few months.  Go to www.learingaboutdiabetes.org   Drink mostly water- at least 4-6 cups a day, 1-2 cups of milk and 1-2 cups each of tea and coffee, and maybe 1/2 cup juice per day, Zero coke or zero Ginger ale  Goal is to have something to drink when get off the    Green and other teas have antioxidants that eat up bad chemicals in your body. The more fruit, veggies, tea coffee, nuts in your body the lower your risk of cancer, diabetes, dementia.   Pasta- best to use whole wheat because it is higher fiber and that helps your blood sugar to not go as high after you eat it.  Psyllium husk is the generic name for Metamucil. You can buy it [place], pharmacies, some food stores. I have gotten  It at grocery stores.  Once a day

## 2020-07-25 NOTE — Progress Notes (Signed)
  Medical Nutrition Therapy:  Appt start time: 5974 end time:  1638. Total time: 30 Visit # 1  Assessment:  Primary concerns today: Danielle Mason would like her A1C to be in the prediabetes range again.  It just rose to 6.5% after being in the prediabetes range for several years (since 2019). She states that what had caused it was that she had gone back to drinking regular sodas and eating larger portions of higher calories and restaurant food. She states her desire for soda increases in  the afternoon when she is tired.  Her weight has increased 9# since 2019.   Preferred Learning Style: No preference indicated  Learning Readiness: Ready/ change in progress  ANTHROPOMETRICS: Estimated body mass index is 38.65 kg/m as calculated from the following:   Height as of 07/06/20: 5\' 2"  (1.575 m).   Weight as of this encounter: 211 lb 4.8 oz (95.8 kg).  WEIGHT HISTORY: was 201# in 2019 at her last vsiit with me.  Wt Readings from Last 10 Encounters:  07/25/20 211 lb 4.8 oz (95.8 kg)  07/12/20 211 lb 11.2 oz (96 kg)  07/06/20 219 lb (99.3 kg)  07/01/20 215 lb 12.8 oz (97.9 kg)  06/27/20 219 lb (99.3 kg)  06/01/20 221 lb 3.2 oz (100.3 kg)  04/12/20 215 lb 8 oz (97.8 kg)  03/16/20 218 lb 8 oz (99.1 kg)  12/29/19 213 lb 1.6 oz (96.7 kg)  10/16/19 221 lb 12.8 oz (100.6 kg)    SLEEP:need to assess at future visit MEDICATIONS: noted with none for diabetes, Lipitor, HCTZ and pepcid twice a day that could increase blood sugars or decrease nutrient absorbtion.  BLOOD SUGAR: CBG (last 3)  Recent Labs    07/25/20 1124  GLUCAP 91   Lab Results  Component Value Date   HGBA1C 6.5 (A) 07/01/2020   HGBA1C 5.7 (A) 06/17/2019   HGBA1C 5.9 (A) 03/18/2019   HGBA1C 6.9 (A) 12/03/2018   HGBA1C 6.3 (A) 09/03/2018    DIETARY INTAKE: Usual eating pattern includes 3 meals and 1 snacks per day. Everyday foods include [place] Mike's sub, pasta, .  Avoided foods include milk,  Constipation:need to assess at  future visit Dining Out (times/week): 5-7 24-hr recall:  B ( AM): grits, water L ( PM): [place] mike's regular #8( 1150 calories each) Snk ( PM): sundrop soda D ( PM):  [place]'s noodles and veggies, pasta Beverages: water, sundrop  Usual physical activity: need to assess at future visit  Progress Towards Goal(s):  In progress.   Nutritional Diagnosis:  NI-1.5 Excessive energy intake As related to eating large portions and drinking sugar sweetend beverages.  As evidenced by her reported intake. .    Intervention:  Nutrition education about calorie content of her current food choices compared to estimated needs, nutrient dense foods, benefits of higher fiber and planning ahead Action Goal: have something ready qhen you get done work to drink and give you neergy  Outcome goal: improved knowledge about lower calorie foods and possible lower A1C and weight Coordination of care: none Teaching Method Utilized: Visual, Auditory,Hands on Handouts given during visit include: After visit summary, a1c handout, what can I eat handout Barriers to learning/adherence to lifestyle change: competing values/ambivalence Demonstrated degree of understanding via:  Teach Back   Monitoring/Evaluation:  Dietary intake, exercise, and body weight in 4 week(s)  Debera Lat, RD 07/25/2020 1:37 PM. .

## 2020-08-03 ENCOUNTER — Other Ambulatory Visit (HOSPITAL_COMMUNITY): Payer: Self-pay

## 2020-08-03 MED FILL — Atorvastatin Calcium Tab 40 MG (Base Equivalent): ORAL | 90 days supply | Qty: 90 | Fill #0 | Status: AC

## 2020-08-10 ENCOUNTER — Encounter: Payer: Self-pay | Admitting: Obstetrics & Gynecology

## 2020-08-10 DIAGNOSIS — N362 Urethral caruncle: Secondary | ICD-10-CM | POA: Insufficient documentation

## 2020-08-22 ENCOUNTER — Ambulatory Visit: Payer: 59 | Admitting: Dietician

## 2020-08-30 ENCOUNTER — Encounter: Payer: Self-pay | Admitting: Dietician

## 2020-08-30 ENCOUNTER — Telehealth: Payer: Self-pay | Admitting: Dietician

## 2020-08-30 ENCOUNTER — Ambulatory Visit (INDEPENDENT_AMBULATORY_CARE_PROVIDER_SITE_OTHER): Payer: 59 | Admitting: Dietician

## 2020-08-30 DIAGNOSIS — E119 Type 2 diabetes mellitus without complications: Secondary | ICD-10-CM

## 2020-08-30 LAB — GLUCOSE, CAPILLARY: Glucose-Capillary: 113 mg/dL — ABNORMAL HIGH (ref 70–99)

## 2020-08-30 NOTE — Telephone Encounter (Signed)
Tried calling this patient. Their voicemail box is not set up. I was unable to leave a message  

## 2020-08-30 NOTE — Progress Notes (Signed)
  Medical Nutrition Therapy:  Appt start time: 1125 end time:  1150 Total time: 25 Visit # 2  Assessment:  Primary concerns today: Ms. Purington would like her A1C to be in the prediabetes range again.   She states she has stopped drinking most sugar sweetened beverages since our last visit and decreased portions at her lunch meal. Toay she is tired and falling asleep at our visit. Last visit she said her desire for soda increases in  the afternoon when she is tired.    ANTHROPOMETRICS: Estimated body mass index is 38.32 kg/m as calculated from the following:   Height as of 07/06/20: 5\' 2"  (1.575 m).   Weight as of this encounter: 209 lb 8 oz (95 kg).  WEIGHT HISTORY: was 201# in 2019 at her last vsiit with me.  Wt Readings from Last 10 Encounters:  08/30/20 209 lb 8 oz (95 kg)  07/25/20 211 lb 4.8 oz (95.8 kg)  07/12/20 211 lb 11.2 oz (96 kg)  07/06/20 219 lb (99.3 kg)  07/01/20 215 lb 12.8 oz (97.9 kg)  06/27/20 219 lb (99.3 kg)  06/01/20 221 lb 3.2 oz (100.3 kg)  04/12/20 215 lb 8 oz (97.8 kg)  03/16/20 218 lb 8 oz (99.1 kg)  12/29/19 213 lb 1.6 oz (96.7 kg)    SLEEP:staying up later than she thinks is good for her- 9-10 PM and gets up at 4 AM   BLOOD SUGAR: CBG (last 3)  Recent Labs    08/30/20 1137  GLUCAP 113*    DIETARY INTAKE: Usual eating pattern includes 2- 3 meals and 1 snacks per day. Everyday foods include [place] Mike's sub, pasta, .  Avoided foods include milk,  Constipation:need to assess at future visit Dining Out (times/week): 5-7 24-hr recall:  B ( AM):  water and pills L ( PM): half a [place] mike's regular #8 (~ 575calories) Snk ( PM): zero coke D ( PM):  Collard greens seasoned with [place] and fat back, pigs feet, dressing, zero coke Beverages: water, zero coke  Usual physical activity: need to assess at future visit  Progress Towards Goal(s):  Some progress.   Nutritional Diagnosis:  NI-1.5 Excessive energy intake As related to eating large portions  and drinking sugar sweetend beverages.  As evidenced by her reported intake. .    Intervention:  Nutrition education about how to sleep better, saturated fat affect on blood sugars.  Action Goal: use lean meats to help reduce your intake of saturated fats  Outcome goal: improved knowledge about lower calorie foods and possible lower A1C and weight Coordination of care: none Teaching Method Utilized: Visual, Auditory,Hands on Handouts given during visit include: After visit summary, a1c handout, what can I eat handout Barriers to learning/adherence to lifestyle change: competing values/ambivalence Demonstrated degree of understanding via:  Teach Back   Monitoring/Evaluation:  Dietary intake, exercise, and body weight in 6 week(s)  Debera Lat, RD 08/30/2020 11:59 AM. .

## 2020-08-30 NOTE — Patient Instructions (Addendum)
Thank you for your visit today!   Decrease your intake of Saturated fat  By using 93% fat free ground beef, chicken, fish, and [place]  Can [place] an of these in spaghetti sauce with whole grain pasta for a delicious meal.   Try to have diet, caffeine free drinks after 3 PM- examples are diet, caffeine free coke.     09/14/2020 10:20 AM 09/14/2020 10:40 AM    Providers   Glory Rosebush, OD  Optometry  NPI: [id number]  [place] [address]    Phone: [phone]  Fax: [phone]   [facility] Address: [address], [place], [place] [address] Phone: [phone]  Follow up in 4-6 weeks on same day you see our doctor- due June 29th.   Debera Lat, RD 08/30/2020 11:52 AM.

## 2020-09-30 ENCOUNTER — Encounter: Payer: Self-pay | Admitting: *Deleted

## 2020-10-13 ENCOUNTER — Other Ambulatory Visit (HOSPITAL_COMMUNITY): Payer: Self-pay

## 2020-10-13 MED FILL — Hydrochlorothiazide Tab 25 MG: ORAL | 90 days supply | Qty: 90 | Fill #0 | Status: AC

## 2020-10-18 ENCOUNTER — Encounter: Payer: Self-pay | Admitting: *Deleted

## 2020-11-02 ENCOUNTER — Encounter: Payer: 59 | Admitting: Student

## 2020-11-14 ENCOUNTER — Telehealth: Payer: Self-pay | Admitting: *Deleted

## 2020-11-14 NOTE — Telephone Encounter (Signed)
Patient called in stating one hour ago she pricked her finger on a friend's lancet that he uses to check his diabetes. States patient does not have a hx of HIV or Hep B/C. She squeezed her finger to make more blood come out then scrubbed it with alcohol. She is advised to wash finger, hands really well with soap and water for at least 20 seconds. Will send to [facility] Team/Attending Pool to see if there is anything thing else they would advise. Last TDaP 2013.

## 2020-11-15 NOTE — Telephone Encounter (Signed)
Attempted to relay below info to patient. Recording immediately comes on stating [facility] is not set up.

## 2020-11-16 NOTE — Telephone Encounter (Signed)
Patient returned call. Relayed info below. She scheduled appt for tomorrow to discuss further.  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 She has no idea how long ago it was used. Discussed having old laundry detergent plastic bottle for her friend to dispose of his sharps. She will speak with him about this. She will also ask if he has been tested for HIV and Hep, and if so, how recently.

## 2020-11-17 ENCOUNTER — Other Ambulatory Visit: Payer: Self-pay

## 2020-11-17 ENCOUNTER — Ambulatory Visit (INDEPENDENT_AMBULATORY_CARE_PROVIDER_SITE_OTHER): Payer: Self-pay | Admitting: Student

## 2020-11-17 ENCOUNTER — Encounter: Payer: Self-pay | Admitting: Student

## 2020-11-17 VITALS — BP 129/82 | HR 82 | Temp 97.6°F | Ht 62.0 in | Wt 215.4 lb

## 2020-11-17 DIAGNOSIS — W461XXA Contact with contaminated hypodermic needle, initial encounter: Secondary | ICD-10-CM

## 2020-11-17 DIAGNOSIS — Z87440 Personal history of urinary (tract) infections: Secondary | ICD-10-CM

## 2020-11-17 DIAGNOSIS — R300 Dysuria: Secondary | ICD-10-CM

## 2020-11-17 LAB — POCT URINALYSIS DIPSTICK
Bilirubin, UA: NEGATIVE
Glucose, UA: NEGATIVE
Ketones, UA: NEGATIVE
Nitrite, UA: NEGATIVE
Protein, UA: NEGATIVE
Spec Grav, UA: 1.025 (ref 1.010–1.025)
Urobilinogen, UA: 0.2 E.U./dL
pH, UA: 5.5 (ref 5.0–8.0)

## 2020-11-17 NOTE — Patient Instructions (Signed)
It was a pleasure meeting you today.   In six weeks we will test for HIV and Hep C given your needle stick.   Please follow up with Dr. Lisabeth Devoid as scheduled for your blood sugar.

## 2020-11-18 LAB — URINALYSIS, ROUTINE W REFLEX MICROSCOPIC
Bilirubin, UA: NEGATIVE
Glucose, UA: NEGATIVE
Ketones, UA: NEGATIVE
Nitrite, UA: NEGATIVE
Protein,UA: NEGATIVE
Specific Gravity, UA: 1.02 (ref 1.005–1.030)
Urobilinogen, Ur: 0.2 mg/dL (ref 0.2–1.0)
pH, UA: 5.5 (ref 5.0–7.5)

## 2020-11-18 LAB — MICROSCOPIC EXAMINATION
Casts: NONE SEEN /lpf
Epithelial Cells (non renal): >10 /hpf — AB (ref 0–10)
WBC, UA: >30 /hpf — AB (ref 0–5)

## 2020-11-19 LAB — URINE CULTURE

## 2020-11-20 DIAGNOSIS — W461XXA Contact with contaminated hypodermic needle, initial encounter: Secondary | ICD-10-CM | POA: Insufficient documentation

## 2020-11-20 NOTE — Assessment & Plan Note (Signed)
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 Patient reports that the needle was used by an elderly gentleman who is in  A long term relationship and she feels is not at high risk for Hep C or HIV. It was explained that she is at low risk for contracting these diseases given the mechanism of the stick and the low risk of the person using the needle. She felt reassured but was offered HIV/Hep C testing in ~6 weeks and would like to proceed with this. She is currently not a candidate for nPEP.   Plan: Patient will follow up with her PCP later this month and Hep C/HIV testing will be performed at that time.
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
## 2020-11-20 NOTE — Assessment & Plan Note (Signed)
Patient reports that her urine has been cloudy and had a strong odor. She denies any suprapubic pain or burning with urination. Her UA showed leukocytes and bacteria. Her UCx grew Grp B strep.  Plan: Will hold on treatment given patient is asymptomatic.

## 2020-11-21 NOTE — Addendum Note (Signed)
Addended by: Althea Grimmer on: 11/21/2020 12:58 PM   Modules accepted: Orders

## 2020-12-03 NOTE — Progress Notes (Signed)
Internal Medicine Clinic Attending  Case discussed with Dr. Carter  At the time of the visit.  We reviewed the resident's history and exam and pertinent patient test results.  I agree with the assessment, diagnosis, and plan of care documented in the resident's note.  

## 2020-12-09 ENCOUNTER — Other Ambulatory Visit (HOSPITAL_COMMUNITY): Payer: Self-pay

## 2020-12-09 ENCOUNTER — Other Ambulatory Visit: Payer: Self-pay | Admitting: Internal Medicine

## 2020-12-09 DIAGNOSIS — I10 Essential (primary) hypertension: Secondary | ICD-10-CM

## 2020-12-09 MED FILL — Atorvastatin Calcium Tab 40 MG (Base Equivalent): ORAL | 60 days supply | Qty: 60 | Fill #1 | Status: CN

## 2020-12-12 ENCOUNTER — Other Ambulatory Visit (HOSPITAL_COMMUNITY): Payer: Self-pay

## 2020-12-12 ENCOUNTER — Other Ambulatory Visit: Payer: Self-pay | Admitting: Internal Medicine

## 2020-12-12 DIAGNOSIS — I10 Essential (primary) hypertension: Secondary | ICD-10-CM

## 2020-12-13 ENCOUNTER — Encounter: Payer: 59 | Admitting: Student

## 2020-12-13 ENCOUNTER — Other Ambulatory Visit (HOSPITAL_COMMUNITY): Payer: Self-pay

## 2020-12-13 MED ORDER — HYDROCHLOROTHIAZIDE 25 MG PO TABS
25.0000 mg | ORAL_TABLET | Freq: Every morning | ORAL | 3 refills | Status: DC
  Filled 2020-12-13 – 2020-12-20 (×2): qty 90, 90d supply, fill #0
  Filled 2021-04-19: qty 90, 90d supply, fill #1
  Filled 2021-04-20 – 2021-07-20 (×3): qty 90, 90d supply, fill #2
  Filled 2021-09-20: qty 90, 90d supply, fill #3

## 2020-12-19 ENCOUNTER — Other Ambulatory Visit: Payer: Self-pay

## 2020-12-19 ENCOUNTER — Emergency Department (HOSPITAL_COMMUNITY): Payer: No Typology Code available for payment source

## 2020-12-19 ENCOUNTER — Encounter (HOSPITAL_COMMUNITY): Payer: Self-pay

## 2020-12-19 ENCOUNTER — Emergency Department (HOSPITAL_COMMUNITY)
Admission: EM | Admit: 2020-12-19 | Discharge: 2020-12-19 | Disposition: A | Discharge status: Home / Self Care | Payer: No Typology Code available for payment source | Attending: Emergency Medicine | Admitting: Emergency Medicine

## 2020-12-19 DIAGNOSIS — S299XXA Unspecified injury of thorax, initial encounter: Secondary | ICD-10-CM | POA: Insufficient documentation

## 2020-12-19 DIAGNOSIS — I10 Essential (primary) hypertension: Secondary | ICD-10-CM | POA: Insufficient documentation

## 2020-12-19 DIAGNOSIS — Y92410 Unspecified street and highway as the place of occurrence of the external cause: Secondary | ICD-10-CM | POA: Diagnosis not present

## 2020-12-19 DIAGNOSIS — T1490XA Injury, unspecified, initial encounter: Secondary | ICD-10-CM

## 2020-12-19 DIAGNOSIS — Z79899 Other long term (current) drug therapy: Secondary | ICD-10-CM | POA: Insufficient documentation

## 2020-12-19 DIAGNOSIS — Z91040 Latex allergy status: Secondary | ICD-10-CM | POA: Diagnosis not present

## 2020-12-19 DIAGNOSIS — Z8616 Personal history of COVID-19: Secondary | ICD-10-CM | POA: Insufficient documentation

## 2020-12-19 DIAGNOSIS — E119 Type 2 diabetes mellitus without complications: Secondary | ICD-10-CM | POA: Diagnosis not present

## 2020-12-19 DIAGNOSIS — S3991XA Unspecified injury of abdomen, initial encounter: Secondary | ICD-10-CM | POA: Diagnosis not present

## 2020-12-19 DIAGNOSIS — S298XXA Other specified injuries of thorax, initial encounter: Secondary | ICD-10-CM

## 2020-12-19 DIAGNOSIS — Z7982 Long term (current) use of aspirin: Secondary | ICD-10-CM | POA: Insufficient documentation

## 2020-12-19 LAB — COMPREHENSIVE METABOLIC PANEL
ALT: 20 U/L (ref 0–44)
AST: 19 U/L (ref 15–41)
Albumin: 3.9 g/dL (ref 3.5–5.0)
Alkaline Phosphatase: 70 U/L (ref 38–126)
Anion gap: 11 (ref 5–15)
BUN: 18 mg/dL (ref 8–23)
CO2: 22 mmol/L (ref 22–32)
Calcium: 9.6 mg/dL (ref 8.9–10.3)
Chloride: 104 mmol/L (ref 98–111)
Creatinine, Ser: 0.76 mg/dL (ref 0.44–1.00)
GFR, Estimated: >60 mL/min (ref >60)
Glucose, Bld: 133 mg/dL — ABNORMAL HIGH (ref 70–99)
Potassium: 3.7 mmol/L (ref 3.5–5.1)
Sodium: 137 mmol/L (ref 135–145)
Total Bilirubin: 1 mg/dL (ref 0.3–1.2)
Total Protein: 7.2 g/dL (ref 6.5–8.1)

## 2020-12-19 LAB — CBC WITH DIFFERENTIAL/PLATELET
Abs Immature Granulocytes: 0.02 10*3/uL (ref 0.00–0.07)
Basophils Absolute: 0.1 10*3/uL (ref 0.0–0.1)
Basophils Relative: 1 %
Eosinophils Absolute: 0.1 10*3/uL (ref 0.0–0.5)
Eosinophils Relative: 1 %
HCT: 41.1 % (ref 36.0–46.0)
Hemoglobin: 13.2 g/dL (ref 12.0–15.0)
Immature Granulocytes: 0 %
Lymphocytes Relative: 30 %
Lymphs Abs: 2.2 10*3/uL (ref 0.7–4.0)
MCH: 28.5 pg (ref 26.0–34.0)
MCHC: 32.1 g/dL (ref 30.0–36.0)
MCV: 88.8 fL (ref 80.0–100.0)
Monocytes Absolute: 0.5 10*3/uL (ref 0.1–1.0)
Monocytes Relative: 6 %
Neutro Abs: 4.7 10*3/uL (ref 1.7–7.7)
Neutrophils Relative %: 62 %
Platelets: 285 10*3/uL (ref 150–400)
RBC: 4.63 MIL/uL (ref 3.87–5.11)
RDW: 14.3 % (ref 11.5–15.5)
WBC: 7.6 10*3/uL (ref 4.0–10.5)
nRBC: 0 % (ref 0.0–0.2)

## 2020-12-19 MED ORDER — METHOCARBAMOL 500 MG PO TABS: 500.0000 mg | ORAL_TABLET | Freq: Three times a day (TID) | ORAL | 0 refills | Status: DC | PRN

## 2020-12-19 MED ORDER — FENTANYL CITRATE PF 50 MCG/ML IJ SOSY
50.0000 ug | PREFILLED_SYRINGE | Freq: Once | INTRAMUSCULAR | Status: AC
  Administered 2020-12-19: 50 ug via INTRAVENOUS
  Filled 2020-12-19: qty 1

## 2020-12-19 MED ORDER — IOHEXOL 350 MG/ML SOLN
100.0000 mL | Freq: Once | INTRAVENOUS | Status: AC | PRN
  Administered 2020-12-19: 100 mL via INTRAVENOUS
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
## 2020-12-20 ENCOUNTER — Telehealth: Payer: Self-pay | Admitting: *Deleted

## 2020-12-20 ENCOUNTER — Other Ambulatory Visit (HOSPITAL_COMMUNITY): Payer: Self-pay

## 2020-12-20 MED ORDER — METHOCARBAMOL 500 MG PO TABS
500.0000 mg | ORAL_TABLET | Freq: Three times a day (TID) | ORAL | 0 refills | Status: DC | PRN
  Filled 2020-12-20: qty 8, 3d supply, fill #0

## 2020-12-20 MED FILL — Atorvastatin Calcium Tab 40 MG (Base Equivalent): ORAL | 60 days supply | Qty: 60 | Fill #1 | Status: AC
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
## 2020-12-20 NOTE — Telephone Encounter (Signed)
Pt called to have her Rx sent to an alternate pharmacy.  RNCM called in Rx as written to pharmacy of choice ([facility]).

## 2020-12-22 ENCOUNTER — Encounter: Payer: Self-pay | Admitting: Student

## 2020-12-22 ENCOUNTER — Other Ambulatory Visit: Payer: Self-pay

## 2020-12-22 ENCOUNTER — Ambulatory Visit (INDEPENDENT_AMBULATORY_CARE_PROVIDER_SITE_OTHER): Payer: Self-pay | Admitting: Student

## 2020-12-22 VITALS — BP 120/66 | HR 83 | Temp 98.1°F | Resp 24 | Ht 62.0 in | Wt 218.4 lb

## 2020-12-22 DIAGNOSIS — R0789 Other chest pain: Secondary | ICD-10-CM | POA: Insufficient documentation

## 2020-12-22 DIAGNOSIS — N644 Mastodynia: Secondary | ICD-10-CM | POA: Insufficient documentation

## 2020-12-22 DIAGNOSIS — E119 Type 2 diabetes mellitus without complications: Secondary | ICD-10-CM

## 2020-12-22 LAB — POCT GLYCOSYLATED HEMOGLOBIN (HGB A1C): Hemoglobin A1C: 6.3 % — AB (ref 4.0–5.6)

## 2020-12-22 LAB — GLUCOSE, CAPILLARY: Glucose-Capillary: 183 mg/dL — ABNORMAL HIGH (ref 70–99)

## 2020-12-22 NOTE — Patient Instructions (Signed)
Danielle Mason,  It was a pleasure seeing you in the clinic today.   Please continue using tylenol, ibuprofen, and robaxin as needed. Make sure to do stretching exercises to keep your muscles loose. Also, apply ice as needed for the bruising. We are checking some labs today. I will call you if there are any abnormal results. I have provided a work note until this upcoming Monday to give your body some time to recover.  Please call our clinic at [phone] if you have any questions or concerns. The best time to call is Monday-Friday from 9am-4pm, but there is someone available 24/7 at the same number. If you need medication refills, please notify your pharmacy one week in advance and they will send [place] a request.   Thank you for letting us take part in your care. We look forward to seeing you next time!

## 2020-12-22 NOTE — Assessment & Plan Note (Addendum)
Patient with history of diabetes controlled with diet alone. Last A1c 6.5% in 06/2020. She does not want to start any medications and would like to continue improving her A1c with diet alone. Discussed repeating A1c today and obtaining a urine microalbumin to creatinine ratio to check for proteinuria. She is agreeable to this.  Repeat A1c 6.3% today so will continue with controlling diabetes through diet alone at this time.  Plan: -f/u urine microalbumin:Cr ratio -repeat A1c in 3 months -continue diet and exercise
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
## 2020-12-23 ENCOUNTER — Telehealth: Payer: Self-pay

## 2020-12-23 LAB — MICROALBUMIN / CREATININE URINE RATIO
Creatinine, Urine: 104.6 mg/dL
Microalb/Creat Ratio: 9 mg/g creat (ref 0–29)
Microalbumin, Urine: 9 ug/mL

## 2020-12-23 NOTE — Telephone Encounter (Signed)
Requesting lab results, please call pt back.  

## 2020-12-26 NOTE — Telephone Encounter (Signed)
Pt is calling back to get her test results, please call back.

## 2020-12-27 NOTE — Progress Notes (Signed)
Internal Medicine Clinic Attending  Case discussed with Dr. Jinwala  At the time of the visit.  We reviewed the resident's history and exam and pertinent patient test results.  I agree with the assessment, diagnosis, and plan of care documented in the resident's note.  

## 2020-12-28 ENCOUNTER — Other Ambulatory Visit (HOSPITAL_COMMUNITY): Payer: Self-pay

## 2020-12-28 ENCOUNTER — Other Ambulatory Visit: Payer: Self-pay | Admitting: *Deleted

## 2020-12-28 DIAGNOSIS — M170 Bilateral primary osteoarthritis of knee: Secondary | ICD-10-CM

## 2020-12-28 MED ORDER — DICLOFENAC SODIUM 1 % EX GEL
2.0000 g | Freq: Four times a day (QID) | CUTANEOUS | 2 refills | Status: DC
Start: 2020-12-28 — End: 2021-04-30
  Filled 2020-12-28: qty 100, 12d supply, fill #0

## 2020-12-28 NOTE — Telephone Encounter (Signed)
Patient called in stating she discussed refill of diclofenac at [facility] on 9/8 but it's not at the pharmacy. States this really helps the pain in chest over left breast area. She has scheduled a 1 week f/u with PCP for tomorrow at 0945 but would like the refill today.

## 2020-12-29 ENCOUNTER — Encounter: Payer: Self-pay | Admitting: Student

## 2020-12-29 ENCOUNTER — Ambulatory Visit (INDEPENDENT_AMBULATORY_CARE_PROVIDER_SITE_OTHER): Payer: Self-pay | Admitting: Student

## 2020-12-29 ENCOUNTER — Other Ambulatory Visit: Payer: Self-pay

## 2020-12-29 DIAGNOSIS — N644 Mastodynia: Secondary | ICD-10-CM

## 2020-12-29 NOTE — Patient Instructions (Signed)
It was a pleasure seeing you in clinic. Today we discussed:   Breast pain; please pick up the Voltaren gel and try hot compresses for pain. It will likely take time for this to improve. Please follow up in 2 weeks for reevaluation   If you have any questions or concerns, please call our clinic at [phone] between 9am-5pm and after hours call [phone] and ask for the internal medicine resident on call. If you feel you are having a medical emergency please call 911.   Thank you, we look forward to helping you remain healthy!
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
## 2021-01-06 ENCOUNTER — Telehealth: Payer: Self-pay | Admitting: *Deleted

## 2021-01-06 NOTE — Telephone Encounter (Signed)
Left patient a message to call or MyChart the office with updated insurance information.

## 2021-01-07 NOTE — Progress Notes (Signed)
Internal Medicine Clinic Attending ? ?Case discussed with Dr. Liang  At the time of the visit.  We reviewed the resident?s history and exam and pertinent patient test results.  I agree with the assessment, diagnosis, and plan of care documented in the resident?s note. ? ?

## 2021-01-09 ENCOUNTER — Ambulatory Visit: Payer: Self-pay | Admitting: Obstetrics and Gynecology

## 2021-01-12 ENCOUNTER — Other Ambulatory Visit: Payer: Self-pay

## 2021-01-12 ENCOUNTER — Ambulatory Visit (INDEPENDENT_AMBULATORY_CARE_PROVIDER_SITE_OTHER): Payer: Self-pay | Admitting: Student

## 2021-01-12 VITALS — BP 126/65 | HR 81 | Temp 98.0°F | Ht 62.0 in | Wt 216.4 lb

## 2021-01-12 DIAGNOSIS — N644 Mastodynia: Secondary | ICD-10-CM

## 2021-01-12 NOTE — Patient Instructions (Addendum)
It was a pleasure seeing you in clinic. Today we discussed:   Left breast pain: I have ordered a mammogram to take a better look at the mass. Please follow up after you have had it completed or if your symptoms are worsening.  If you have any questions or concerns, please call our clinic at [phone] between 9am-5pm and after hours call [phone] and ask for the internal medicine resident on call. If you feel you are having a medical emergency please call 911.   Thank you, we look forward to helping you remain healthy!

## 2021-01-17 ENCOUNTER — Other Ambulatory Visit (HOSPITAL_COMMUNITY): Payer: Self-pay
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
## 2021-01-26 NOTE — Progress Notes (Signed)
Internal Medicine Clinic Attending ? ?Case discussed with Dr. Liang  At the time of the visit.  We reviewed the resident?s history and exam and pertinent patient test results.  I agree with the assessment, diagnosis, and plan of care documented in the resident?s note. ? ?

## 2021-01-30 ENCOUNTER — Encounter: Payer: Self-pay | Admitting: Obstetrics & Gynecology

## 2021-01-30 ENCOUNTER — Other Ambulatory Visit: Payer: Self-pay

## 2021-01-30 ENCOUNTER — Ambulatory Visit (INDEPENDENT_AMBULATORY_CARE_PROVIDER_SITE_OTHER): Payer: Self-pay | Admitting: Obstetrics & Gynecology

## 2021-01-30 ENCOUNTER — Other Ambulatory Visit (HOSPITAL_COMMUNITY)
Admission: RE | Admit: 2021-01-30 | Discharge: 2021-01-30 | Disposition: A | Discharge status: Home / Self Care | Payer: Self-pay | Source: Ambulatory Visit | Attending: Obstetrics & Gynecology | Admitting: Obstetrics & Gynecology

## 2021-01-30 VITALS — BP 126/83 | HR 81 | Ht 62.0 in | Wt 214.0 lb

## 2021-01-30 DIAGNOSIS — N898 Other specified noninflammatory disorders of vagina: Secondary | ICD-10-CM

## 2021-01-30 DIAGNOSIS — N6322 Unspecified lump in the left breast, upper inner quadrant: Secondary | ICD-10-CM

## 2021-01-30 NOTE — Progress Notes (Signed)
Pt stopped using Estradiol Pt states vaginal dryness has improved and she has no complaints
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
## 2021-01-31 LAB — CERVICOVAGINAL ANCILLARY ONLY
Bacterial Vaginitis (gardnerella): NEGATIVE
Candida Glabrata: NEGATIVE
Candida Vaginitis: NEGATIVE
Comment: NEGATIVE
Comment: NEGATIVE
Comment: NEGATIVE

## 2021-02-13 ENCOUNTER — Other Ambulatory Visit: Payer: Self-pay

## 2021-02-13 ENCOUNTER — Encounter: Payer: Self-pay | Admitting: Obstetrics & Gynecology

## 2021-02-13 ENCOUNTER — Ambulatory Visit (INDEPENDENT_AMBULATORY_CARE_PROVIDER_SITE_OTHER): Payer: 59 | Admitting: Obstetrics & Gynecology

## 2021-02-13 VITALS — BP 125/78 | HR 72 | Ht 62.0 in | Wt 214.0 lb

## 2021-02-13 DIAGNOSIS — N342 Other urethritis: Secondary | ICD-10-CM

## 2021-02-13 LAB — HM DIABETES EYE EXAM
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
## 2021-02-28 ENCOUNTER — Encounter: Payer: Self-pay | Admitting: Dietician

## 2021-03-13 ENCOUNTER — Ambulatory Visit: Payer: 59 | Admitting: Obstetrics & Gynecology

## 2021-03-16 ENCOUNTER — Telehealth: Payer: Self-pay | Admitting: *Deleted

## 2021-03-16 ENCOUNTER — Ambulatory Visit (INDEPENDENT_AMBULATORY_CARE_PROVIDER_SITE_OTHER): Payer: Self-pay | Admitting: Student

## 2021-03-16 ENCOUNTER — Encounter: Payer: Self-pay | Admitting: Student

## 2021-03-16 VITALS — BP 143/78 | HR 98 | Temp 98.0°F | Wt 221.0 lb

## 2021-03-16 DIAGNOSIS — R051 Acute cough: Secondary | ICD-10-CM

## 2021-03-16 DIAGNOSIS — E119 Type 2 diabetes mellitus without complications: Secondary | ICD-10-CM

## 2021-03-16 DIAGNOSIS — W461XXA Contact with contaminated hypodermic needle, initial encounter: Secondary | ICD-10-CM

## 2021-03-16 DIAGNOSIS — I10 Essential (primary) hypertension: Secondary | ICD-10-CM

## 2021-03-16 DIAGNOSIS — J069 Acute upper respiratory infection, unspecified: Secondary | ICD-10-CM

## 2021-03-16 NOTE — Assessment & Plan Note (Signed)
HIV and hepatitis C blood work was not done.  I offered to patient today but she declined.

## 2021-03-16 NOTE — Patient Instructions (Signed)
Danielle Mason,  It was a pleasure talking to you today.  I will check for COVID, flu and RSV.  I will call you for the result.  In the meantime, try to wear a mask when you are outside.  Please return in 3 months for A1c follow-up.  Take care,  Dr. Alfonse Spruce
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
## 2021-03-16 NOTE — Assessment & Plan Note (Signed)
Patient presents with 1 day of coughing, stated cough is nonproductive, endorses congestion.  Endorses mild muscle aching but no fever, chills or shortness of breath.  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  Patient is taking Tylenol for her pain.  She states that she came in for checkup because her husband has COPD and she does not want to transmit infection to him.  She also will have a family reunion on the 10th of this month.  Assessment and plan Patient appears comfortable with no acute distress on exam.  She was not coughing during our interview.  Her lung sounds clear.  Suspect this is a mild viral upper respiratory tract infection.  Continue supportive therapy with Tylenol.  Given her concern of her husband and family reunion, will check for COVID, flu and RSV today.  Advised patient to continue wearing mask when going outside.

## 2021-03-16 NOTE — Telephone Encounter (Signed)
Patient called in for appt and was transferred to Triage. C/o 1 week hx of night sweats, and 1 day hx of nonproductive cough. Denies sick contacts (Covid, TB). Has been boosted x 1 for Covid. Appt given today at 0945 with [facility].

## 2021-03-16 NOTE — Assessment & Plan Note (Signed)
Last A1c of 6.3 in September.  I offered to recheck A1c today however patient declined.  She endorses dietary indiscretion due to the holiday.  She states that she will try to eat better after the holiday.  -Check A1c at next visit.

## 2021-03-16 NOTE — Assessment & Plan Note (Signed)
Blood pressure is elevated 143/78.  Would not adjust medication in the setting of acute illness.  -Recheck blood pressure at next visit

## 2021-03-16 NOTE — Progress Notes (Signed)
Internal Medicine Clinic Attending  Case discussed with Dr. Nguyen  At the time of the visit.  We reviewed the resident's history and exam and pertinent patient test results.  I agree with the assessment, diagnosis, and plan of care documented in the resident's note. 

## 2021-03-17 LAB — COVID-19, FLU A+B AND RSV
Influenza A, NAA: NOT DETECTED
Influenza B, NAA: NOT DETECTED
RSV, NAA: NOT DETECTED
SARS-CoV-2, NAA: NOT DETECTED

## 2021-03-20 ENCOUNTER — Telehealth: Payer: Self-pay

## 2021-03-20 NOTE — Telephone Encounter (Signed)
Requesting lab results, please call pt back.  

## 2021-03-21 ENCOUNTER — Encounter: Payer: Self-pay | Admitting: Internal Medicine

## 2021-04-11 ENCOUNTER — Telehealth: Payer: Self-pay

## 2021-04-11 ENCOUNTER — Other Ambulatory Visit (HOSPITAL_COMMUNITY): Payer: Self-pay

## 2021-04-11 ENCOUNTER — Other Ambulatory Visit: Payer: Self-pay | Admitting: Student

## 2021-04-11 DIAGNOSIS — E782 Mixed hyperlipidemia: Secondary | ICD-10-CM

## 2021-04-11 MED ORDER — ATORVASTATIN CALCIUM 40 MG PO TABS
40.0000 mg | ORAL_TABLET | Freq: Every day | ORAL | 3 refills | Status: DC
Start: 2021-04-11 — End: 2022-01-30
  Filled 2021-04-11: qty 90, 90d supply, fill #0
  Filled 2021-04-19 (×2): qty 30, 30d supply, fill #0
  Filled 2021-04-20: qty 30, 30d supply, fill #1
  Filled 2021-04-20: qty 90, 90d supply, fill #1
  Filled 2021-06-07: qty 30, 30d supply, fill #1
  Filled 2021-06-07: qty 90, 90d supply, fill #1
  Filled 2021-09-20: qty 90, 90d supply, fill #2

## 2021-04-11 NOTE — Telephone Encounter (Signed)
Pt is requesting a call back .. she stated that she mistakenly put soap in her private area and she is not having burning when she urinates and it is dry .Marland Kitchen

## 2021-04-12 ENCOUNTER — Telehealth: Payer: Self-pay | Admitting: *Deleted

## 2021-04-12 NOTE — Telephone Encounter (Signed)
RTC from patient given instructions to wear cotton underwear, increase her fluid intake and to try Cranberry juice.  Advised not to use in creams in the Vaginal area.  If symptoms continue may need to come in for possible UTI.  Patient also has an appointment with her GYN doctor next week if symptoms continue will have her GYN take a look.

## 2021-04-12 NOTE — Telephone Encounter (Incomplete Revision)
Call from patient accidentally used soap when bathing in the Vaginal area.  Burns on urination Not discharge from vaginal area as of yet.  Burns on urination.  Told to try and keep area as dry as possible and do not use any scented pads if needed. Also to pat area dry after urination.  Patient would like to know if there is anything she can do to relieve the burning for now.   RTC to patient.  Unable to leave message on VM that the Clinics had called

## 2021-04-12 NOTE — Telephone Encounter (Addendum)
Call from patient accidentally used soap when bathing in the Vaginal area.  Burns on urination Not discharge from vaginal area as of yet.  Burns on urination.  Told to try and keep area as dry as possible and do not use any scented pads if needed. Also to pat area dry after urination.  Patient would like to know if there is anything she can do to relieve the burning for now.   RTC to patient.  Unable to leave message on VM that the Clinics had called

## 2021-04-19 ENCOUNTER — Other Ambulatory Visit (HOSPITAL_COMMUNITY): Payer: Self-pay

## 2021-04-20 ENCOUNTER — Encounter: Payer: Self-pay | Admitting: Obstetrics & Gynecology

## 2021-04-20 ENCOUNTER — Other Ambulatory Visit (HOSPITAL_COMMUNITY): Payer: Self-pay

## 2021-04-20 ENCOUNTER — Other Ambulatory Visit: Payer: Self-pay

## 2021-04-20 ENCOUNTER — Ambulatory Visit (INDEPENDENT_AMBULATORY_CARE_PROVIDER_SITE_OTHER): Payer: Managed Care, Other (non HMO) | Admitting: Obstetrics & Gynecology

## 2021-04-20 VITALS — BP 137/90 | HR 82 | Wt 217.0 lb

## 2021-04-20 DIAGNOSIS — N342 Other urethritis: Secondary | ICD-10-CM

## 2021-04-20 NOTE — Progress Notes (Signed)
° °  Subjective:    Patient ID: Danielle Mason, female    DOB: Mar 23, 1961, 61 y.o.   MRN: [id number]  HPI  [name] is a 61 year old female who presents for follow-up of redness underneath her urethra.  Patient has not been sexually active.  At her last visit using a mirror, we instructed her exactly where to place the cream.  She thinks that her husband was placing it in the correct area.  She is not experiencing any bleeding, pain, dysuria.  Review of Systems  Constitutional: Negative.   Respiratory: Negative.    Cardiovascular: Negative.   Gastrointestinal: Negative.   Genitourinary: Negative.       Objective:   Physical Exam Vitals reviewed.  Constitutional:      General: She is not in acute distress.    Appearance: She is well-developed.  HENT:     Head: Normocephalic and atraumatic.  Eyes:     Conjunctiva/sclera: Conjunctivae normal.  Cardiovascular:     Rate and Rhythm: Normal rate.  Pulmonary:     Effort: Pulmonary effort is normal.  Skin:    General: Skin is warm and dry.  Neurological:     Mental Status: She is alert and oriented to person, place, and time.  Psychiatric:        Mood and Affect: Mood normal.    Assessment & Plan:   61 year old female with Perry urethral redness unchanged with vaginal estrogen cream. Note sent to [name] to see if this is something that she can evaluate.  If she cannot, we will send to general urology.  22 minutes spent with patient encounter, this includes review of records, history and physical, documentation, counseling, and coordination of care.

## 2021-04-20 NOTE — Addendum Note (Signed)
Addended by: Iona Beard on: 04/20/2021 09:25 AM   Modules accepted: Orders

## 2021-04-30 ENCOUNTER — Emergency Department (HOSPITAL_BASED_OUTPATIENT_CLINIC_OR_DEPARTMENT_OTHER)
Admission: EM | Admit: 2021-04-30 | Discharge: 2021-04-30 | Disposition: A | Discharge status: Home / Self Care | Payer: Commercial Managed Care - HMO | Attending: Emergency Medicine | Admitting: Emergency Medicine

## 2021-04-30 ENCOUNTER — Emergency Department (HOSPITAL_BASED_OUTPATIENT_CLINIC_OR_DEPARTMENT_OTHER): Payer: Commercial Managed Care - HMO | Admitting: Radiology

## 2021-04-30 ENCOUNTER — Other Ambulatory Visit: Payer: Self-pay

## 2021-04-30 ENCOUNTER — Encounter (HOSPITAL_BASED_OUTPATIENT_CLINIC_OR_DEPARTMENT_OTHER): Payer: Self-pay | Admitting: Obstetrics and Gynecology

## 2021-04-30 DIAGNOSIS — M79602 Pain in left arm: Secondary | ICD-10-CM | POA: Diagnosis not present

## 2021-04-30 DIAGNOSIS — M25512 Pain in left shoulder: Secondary | ICD-10-CM | POA: Diagnosis present

## 2021-04-30 DIAGNOSIS — Z7982 Long term (current) use of aspirin: Secondary | ICD-10-CM | POA: Insufficient documentation

## 2021-04-30 DIAGNOSIS — Z79899 Other long term (current) drug therapy: Secondary | ICD-10-CM | POA: Diagnosis not present

## 2021-04-30 DIAGNOSIS — M170 Bilateral primary osteoarthritis of knee: Secondary | ICD-10-CM

## 2021-04-30 DIAGNOSIS — I10 Essential (primary) hypertension: Secondary | ICD-10-CM | POA: Diagnosis not present

## 2021-04-30 DIAGNOSIS — Z91040 Latex allergy status: Secondary | ICD-10-CM | POA: Insufficient documentation

## 2021-04-30 DIAGNOSIS — G8929 Other chronic pain: Secondary | ICD-10-CM | POA: Insufficient documentation

## 2021-04-30 MED ORDER — DICLOFENAC SODIUM 1 % EX GEL
2.0000 g | Freq: Four times a day (QID) | CUTANEOUS | 0 refills | Status: DC
  Filled 2021-04-30 – 2021-06-07 (×2): qty 100, 12d supply, fill #0
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
## 2021-04-30 NOTE — Discharge Instructions (Addendum)
You were seen here today for evaluation of your shoulder pain.  Your x-ray shows some degenerative changes, but no acute findings.  I referred you to an orthopedic provider.  Please call them tomorrow to schedule an appointment.  Additionally, I have refilled your diclofenac prescription.  Please use as prescribed.  You can take Tylenol with this medication 1000 mg every 6 hours as needed for pain.  If you have any numbness, tingling, weakness, chest pain, shortness of breath, please return the nearest emergency department for evaluation.

## 2021-05-01 ENCOUNTER — Other Ambulatory Visit (HOSPITAL_COMMUNITY): Payer: Self-pay

## 2021-05-09 ENCOUNTER — Other Ambulatory Visit (HOSPITAL_COMMUNITY): Payer: Self-pay

## 2021-06-07 ENCOUNTER — Other Ambulatory Visit (HOSPITAL_COMMUNITY): Payer: Self-pay

## 2021-06-07 ENCOUNTER — Other Ambulatory Visit: Payer: Self-pay | Admitting: Internal Medicine

## 2021-06-07 DIAGNOSIS — M170 Bilateral primary osteoarthritis of knee: Secondary | ICD-10-CM

## 2021-06-08 ENCOUNTER — Other Ambulatory Visit (HOSPITAL_COMMUNITY): Payer: Self-pay

## 2021-06-09 ENCOUNTER — Ambulatory Visit: Payer: Managed Care, Other (non HMO) | Admitting: Obstetrics and Gynecology

## 2021-06-09 ENCOUNTER — Encounter: Payer: Self-pay | Admitting: Obstetrics and Gynecology

## 2021-06-09 ENCOUNTER — Other Ambulatory Visit: Payer: Self-pay

## 2021-06-09 ENCOUNTER — Other Ambulatory Visit (HOSPITAL_COMMUNITY): Payer: Self-pay

## 2021-06-09 ENCOUNTER — Other Ambulatory Visit (HOSPITAL_COMMUNITY)
Admission: RE | Admit: 2021-06-09 | Discharge: 2021-06-09 | Disposition: A | Discharge status: Home / Self Care | Payer: Managed Care, Other (non HMO) | Source: Ambulatory Visit | Attending: Obstetrics and Gynecology | Admitting: Obstetrics and Gynecology

## 2021-06-09 DIAGNOSIS — N368 Other specified disorders of urethra: Secondary | ICD-10-CM | POA: Diagnosis not present

## 2021-06-09 DIAGNOSIS — R82998 Other abnormal findings in urine: Secondary | ICD-10-CM

## 2021-06-09 MED ORDER — IBUPROFEN 600 MG PO TABS
600.0000 mg | ORAL_TABLET | Freq: Three times a day (TID) | ORAL | 0 refills | Status: DC | PRN
  Filled 2021-06-09: qty 30, 10d supply, fill #0
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
## 2021-06-10 LAB — URINE CULTURE: Culture: 50000 — AB

## 2021-06-14 ENCOUNTER — Other Ambulatory Visit (HOSPITAL_COMMUNITY): Payer: Self-pay

## 2021-06-14 MED ORDER — CLINDAMYCIN HCL 300 MG PO CAPS
300.0000 mg | ORAL_CAPSULE | Freq: Three times a day (TID) | ORAL | 0 refills | Status: AC
  Filled 2021-06-14: qty 15, 5d supply, fill #0

## 2021-06-14 NOTE — Progress Notes (Signed)
Danielle Mason is a 61 y.o. female was contacted and will pick up her medication today

## 2021-06-14 NOTE — Addendum Note (Signed)
Addended by: Jaquita Folds on: 06/14/2021 11:05 AM   Modules accepted: Orders

## 2021-06-18 ENCOUNTER — Emergency Department (HOSPITAL_BASED_OUTPATIENT_CLINIC_OR_DEPARTMENT_OTHER): Payer: Commercial Managed Care - HMO

## 2021-06-18 ENCOUNTER — Other Ambulatory Visit: Payer: Self-pay

## 2021-06-18 ENCOUNTER — Emergency Department (HOSPITAL_BASED_OUTPATIENT_CLINIC_OR_DEPARTMENT_OTHER)
Admission: EM | Admit: 2021-06-18 | Discharge: 2021-06-18 | Disposition: A | Discharge status: Home / Self Care | Payer: Commercial Managed Care - HMO | Attending: Emergency Medicine | Admitting: Emergency Medicine

## 2021-06-18 ENCOUNTER — Encounter (HOSPITAL_BASED_OUTPATIENT_CLINIC_OR_DEPARTMENT_OTHER): Payer: Self-pay | Admitting: Emergency Medicine

## 2021-06-18 DIAGNOSIS — I10 Essential (primary) hypertension: Secondary | ICD-10-CM | POA: Diagnosis not present

## 2021-06-18 DIAGNOSIS — E119 Type 2 diabetes mellitus without complications: Secondary | ICD-10-CM | POA: Diagnosis not present

## 2021-06-18 DIAGNOSIS — M7989 Other specified soft tissue disorders: Secondary | ICD-10-CM | POA: Diagnosis not present

## 2021-06-18 DIAGNOSIS — Z91040 Latex allergy status: Secondary | ICD-10-CM | POA: Insufficient documentation

## 2021-06-18 DIAGNOSIS — Z79899 Other long term (current) drug therapy: Secondary | ICD-10-CM | POA: Insufficient documentation

## 2021-06-18 DIAGNOSIS — M25571 Pain in right ankle and joints of right foot: Secondary | ICD-10-CM

## 2021-06-18 DIAGNOSIS — Z7982 Long term (current) use of aspirin: Secondary | ICD-10-CM | POA: Diagnosis not present

## 2021-06-18 NOTE — ED Provider Notes (Signed)
[facility] Provider Note   CSN: [id number] Arrival date & time: 06/18/21  1725     History  Chief Complaint  Patient presents with   Ankle Pain    Danielle Mason is a 61 y.o. female.  The history is provided by the patient, the spouse and medical records. No language interpreter was used.  Ankle Pain Location:  Ankle Time since incident:  3 months Injury: no   Ankle location:  R ankle Pain details:    Quality:  Aching   Radiates to:  Does not radiate   Severity:  Moderate   Onset quality:  Gradual   Timing:  Constant   Progression:  Waxing and waning Foreign body present:  No foreign bodies Prior injury to area:  No Relieved by:  Nothing Worsened by:  Extension, flexion and bearing weight Ineffective treatments:  None tried Associated symptoms: swelling   Associated symptoms: no back pain, no decreased ROM, no fatigue, no fever, no itching, no muscle weakness, no neck pain, no numbness, no stiffness and no tingling       Home Medications Prior to Admission medications   Medication Sig Start Date End Date Taking? Authorizing Provider  acetaminophen (TYLENOL) 325 MG tablet Take 325 mg by mouth every 6 (six) hours as needed for mild pain or headache.    [provider]  aspirin EC 81 MG tablet Take 81 mg by mouth daily.    [provider]  atorvastatin (LIPITOR) 40 MG tablet Take 1 tablet (40 mg total) by mouth daily. 04/11/21   Iona Beard, MD  augmented betamethasone dipropionate (DIPROLENE-AF) 0.05 % ointment Apply to area twice a day for two weeks, then once a day for a week, then twice a week for two weeks. 07/02/20   Guss Bunde, MD  clindamycin (CLEOCIN) 300 MG capsule Take 1 capsule (300 mg total) by mouth 3 (three) times daily for 5 days. 06/14/21 06/19/21  Jaquita Folds, MD  estradiol (ESTRACE VAGINAL) 0.1 MG/GM vaginal cream Place 2 g vaginally daily. Patient not taking: Reported on 04/20/2021 06/03/20    Guss Bunde, MD  hydrochlorothiazide (HYDRODIURIL) 25 MG tablet Take 1 tablet (25 mg total) by mouth in the morning. 12/13/20   Iona Beard, MD  ibuprofen (ADVIL) 600 MG tablet Take 1 tablet (600 mg total) by mouth every 8 (eight) hours as needed. 06/09/21   Iona Beard, MD  dicyclomine (BENTYL) 20 MG tablet Take 1 tablet (20 mg total) by mouth 2 (two) times daily. Patient not taking: No sig reported 04/12/20 06/27/20  Margarita Mail, PA-C      Allergies    Eicosapentaenoic acid (epa), Fish-derived products, Penicillins, and Latex    Review of Systems   Review of Systems  Constitutional:  Negative for chills, fatigue and fever.  HENT:  Negative for congestion.   Respiratory:  Negative for cough, chest tightness, shortness of breath and wheezing.   Cardiovascular:  Positive for leg swelling (ankle swelling). Negative for chest pain and palpitations.  Gastrointestinal:  Negative for abdominal pain, constipation, diarrhea, nausea and vomiting.  Genitourinary:  Negative for dysuria and flank pain.  Musculoskeletal:  Negative for back pain, neck pain, neck stiffness and stiffness.  Skin:  Negative for itching, rash and wound.  Neurological:  Negative for weakness, numbness and headaches.  Psychiatric/Behavioral:  Negative for agitation.   All other systems reviewed and are negative.  Physical Exam Updated Vital Signs BP (!) 168/105 (BP Location: Right Arm)  Pulse 94    Temp 97.8 F (36.6 C)    Resp 16    Ht '5\' 2"'$  (1.575 m)    Wt 98.9 kg    SpO2 98%    BMI 39.87 kg/m  Physical Exam Vitals and nursing note reviewed.  Constitutional:      General: She is not in acute distress.    Appearance: She is well-developed. She is not ill-appearing, toxic-appearing or diaphoretic.  HENT:     Head: Normocephalic and atraumatic.     Nose: No congestion or rhinorrhea.     Mouth/Throat:     Mouth: Mucous membranes are moist.     Pharynx: No oropharyngeal exudate or posterior oropharyngeal  erythema.  Eyes:     Conjunctiva/sclera: Conjunctivae normal.  Cardiovascular:     Rate and Rhythm: Normal rate and regular rhythm.     Heart sounds: No murmur heard. Pulmonary:     Effort: Pulmonary effort is normal. No respiratory distress.     Breath sounds: Normal breath sounds. No wheezing, rhonchi or rales.  Chest:     Chest wall: No tenderness.  Abdominal:     General: Abdomen is flat.     Palpations: Abdomen is soft.     Tenderness: There is no abdominal tenderness. There is no right CVA tenderness, left CVA tenderness, guarding or rebound.  Musculoskeletal:        General: Swelling and tenderness present.     Cervical back: Neck supple.     Right lower leg: No edema.     Left lower leg: No edema.     Right ankle: Swelling present. Tenderness present.       Legs:     Comments: Tenderness to right lateral ankle.  Some swelling.  Intact cap refill, sensation, strength, pulses.  No tenderness in the calf knee or hip.  Skin:    General: Skin is warm and dry.     Capillary Refill: Capillary refill takes less than 2 seconds.  Neurological:     General: No focal deficit present.     Mental Status: She is alert.     Sensory: No sensory deficit.     Motor: No weakness.  Psychiatric:        Mood and Affect: Mood normal.    ED Results / Procedures / Treatments   Labs (all labs ordered are listed, but only abnormal results are displayed) Labs Reviewed - No data to display  EKG None  Radiology DG Ankle Complete Right  Result Date: 06/18/2021 CLINICAL DATA:  Right ankle pain. No known injury. Pain for months. EXAM: RIGHT ANKLE - COMPLETE 3+ VIEW COMPARISON:  None. FINDINGS: There is no evidence of fracture, dislocation, or joint effusion. Normal alignment and joint spaces. The ankle mortise is preserved. No erosions or periostitis. Plantar calcaneal spur and small Achilles tendon enthesophyte. Generalized soft tissue edema. IMPRESSION: 1. Generalized soft tissue edema. No  acute osseous abnormality. 2. Plantar calcaneal spur and small Achilles tendon enthesophyte. Electronically Signed   By: Keith Rake M.D.   On: 06/18/2021 18:40   US Venous Img Lower Unilateral Right  Result Date: 06/18/2021 CLINICAL DATA:  Right ankle pain and swelling x3 months. EXAM: RIGHT LOWER EXTREMITY VENOUS DOPPLER ULTRASOUND TECHNIQUE: Gray-scale sonography with compression, as well as color and duplex ultrasound, were performed to evaluate the deep venous system(s) from the level of the common femoral vein through the popliteal and proximal calf veins. COMPARISON:  None. FINDINGS: VENOUS Normal compressibility of the common  femoral, superficial femoral, and popliteal veins, as well as the visualized calf veins. Visualized portions of profunda femoral vein and great saphenous vein unremarkable. No filling defects to suggest DVT on grayscale or color Doppler imaging. Doppler waveforms show normal direction of venous flow, normal respiratory plasticity and response to augmentation. Limited views of the contralateral common femoral vein are unremarkable. OTHER None. Limitations: none IMPRESSION: Negative. Electronically Signed   By: Virgina Norfolk M.D.   On: 06/18/2021 20:23    Procedures Procedures    Medications Ordered in ED Medications - No data to display  ED Course/ Medical Decision Making/ A&P                           Medical Decision Making Amount and/or Complexity of Data Reviewed Radiology: ordered. ECG/medicine tests: ordered.    Zylie Mumaw Morrow is a 61 y.o. female with a past medical history significant for irritable bowel syndrome, hypertension, hyperlipidemia, GERD, diabetes, and osteoarthritis who presents with right ankle pain and swelling.  According to patient, for the last few months she has had pain in her right ankle that seems to be worse when she uses it at work.  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  She  reports that she has not had specific trauma and has not had previous fracture in the ankle.  She reports it was slightly swollen and she went to make sure she did not have a blood clot.  She denies any chest pain, shortness of breath, or other complaints.  No nausea, vomiting diarrhea.  No skin changes or rashes reported.  No lacerations.  No fevers, chills, or warmth to the ankle.  No history of septic joints.  On exam, patient has tenderness in the lateral ankle with some swelling.  No crepitance.  Normal cap refill, sensation, strength, and pulses.  Normal range of motion.  Patient is able to ambulate and walk.  No knee tenderness or hip tenderness.  Exam otherwise unremarkable.  Clinically I do suspect patient has some overuse injury to her ankle that makes it feel like she sprained it.  Due to her concern for the swelling being from a blood clot, ultrasound was ordered and was negative.  X-ray was obtained showed no evidence of joint effusion or ankle fracture.  Soft tissue swelling seen.  Given the timeline of symptoms for several months and exam have very low suspicion for septic arthritis and we agreed to hold on labs or more extensive work-up at this time.  Suspect overuse pain with a likely sprained ankle at some point.  Patient given support with an ASO and she will follow-up with podiatry or orthopedics.  She is reassured about the ultrasound.  She agrees with plan of care and will follow-up with the teams.  She no other questions or concerns and was discharged in good condition.           Final Clinical Impression(s) / ED Diagnoses Final diagnoses:  Acute right ankle pain    Rx / DC Orders ED Discharge Orders     None      Clinical Impression: 1. Acute right ankle pain     Disposition: Discharge  Condition: Good  I have discussed the results, Dx and Tx plan with the pt(& family if present). He/she/they expressed understanding and agree(s) with the plan. Discharge  instructions discussed at great length. Strict return precautions discussed and  pt &/or family have verbalized understanding of the instructions. No further questions at time of discharge.    Discharge Medication List as of 06/18/2021  8:44 PM      Follow Up: Edrick Kins, DPM [address] [place] [address] [phone]     Marisa Sprinkles [address] [place] [address] [facility] [place] [address] [phone]        Yaileen Hofferber, Gwenyth Allegra, MD 06/19/21 [phone]

## 2021-06-18 NOTE — ED Triage Notes (Signed)
Pt endorses right ankle pain since December. Denies any injury. No obvious deformity.  ?

## 2021-06-18 NOTE — ED Notes (Signed)
Dc insructions reviewed with pt no questions or concerns at this time. Will follow up with ortho.  ?

## 2021-06-18 NOTE — Discharge Instructions (Signed)
Your history, exam, work-up today are consistent with ankle pain likely related to a chronic use sprain.  No evidence of acute fracture.  No evidence of effusion on x-ray.  Given your story, infection is unlikely as we discussed.  The ultrasound was negative for blood clot.  We provided ankle support and want you to follow-up with either podiatry or orthopedics for further management.  If any symptoms change or worsen, please return to the nearest emergency department. ?

## 2021-06-27 ENCOUNTER — Other Ambulatory Visit (HOSPITAL_COMMUNITY): Payer: Self-pay

## 2021-07-05 ENCOUNTER — Encounter: Payer: Self-pay | Admitting: Obstetrics and Gynecology

## 2021-07-05 ENCOUNTER — Other Ambulatory Visit: Payer: Self-pay

## 2021-07-05 ENCOUNTER — Other Ambulatory Visit (HOSPITAL_COMMUNITY)
Admission: RE | Admit: 2021-07-05 | Discharge: 2021-07-05 | Disposition: A | Discharge status: Home / Self Care | Payer: Managed Care, Other (non HMO) | Source: Ambulatory Visit | Attending: Obstetrics and Gynecology | Admitting: Obstetrics and Gynecology

## 2021-07-05 ENCOUNTER — Ambulatory Visit: Payer: Managed Care, Other (non HMO) | Admitting: Obstetrics and Gynecology

## 2021-07-05 VITALS — BP 113/77 | HR 74 | Wt 218.0 lb

## 2021-07-05 DIAGNOSIS — N368 Other specified disorders of urethra: Secondary | ICD-10-CM

## 2021-07-05 DIAGNOSIS — N341 Nonspecific urethritis: Secondary | ICD-10-CM

## 2021-07-05 DIAGNOSIS — N362 Urethral caruncle: Secondary | ICD-10-CM

## 2021-07-05 MED ORDER — LIDOCAINE HCL 2 % IJ SOLN
3.0000 mL | Freq: Once | INTRAMUSCULAR | Status: AC
  Administered 2021-07-05: 60 mg
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
## 2021-07-06 ENCOUNTER — Encounter: Payer: Self-pay | Admitting: Obstetrics and Gynecology

## 2021-07-07 ENCOUNTER — Telehealth: Payer: Self-pay | Admitting: Obstetrics and Gynecology

## 2021-07-07 LAB — SURGICAL PATHOLOGY

## 2021-07-07 NOTE — Telephone Encounter (Signed)
Pt was contacted and all questions answered ?

## 2021-07-17 ENCOUNTER — Other Ambulatory Visit: Payer: Self-pay

## 2021-07-17 ENCOUNTER — Telehealth: Payer: Self-pay

## 2021-07-17 ENCOUNTER — Other Ambulatory Visit (HOSPITAL_COMMUNITY): Payer: Self-pay

## 2021-07-17 NOTE — Telephone Encounter (Signed)
Spoke with patient. Informed her of benign biopsy results. We discussed overall avoiding bladder irritants such as soda and caffeine, but that some people are able to tolerate better than others so she can see how she responds to the gingerale. All questions answered.  ?

## 2021-07-17 NOTE — Telephone Encounter (Signed)
Danielle Mason is a 61 y.o. female called in wanting to know when it is ok to drink caffeine and if she can drink ginger ale? ? ?I told the pt I would send a message to [name] asking and call her when I hear back from the [name] verbalized understanding. ?

## 2021-07-18 ENCOUNTER — Other Ambulatory Visit (HOSPITAL_COMMUNITY): Payer: Self-pay

## 2021-07-19 ENCOUNTER — Other Ambulatory Visit: Payer: Self-pay

## 2021-07-19 ENCOUNTER — Other Ambulatory Visit (HOSPITAL_COMMUNITY): Payer: Self-pay

## 2021-07-20 ENCOUNTER — Other Ambulatory Visit (HOSPITAL_COMMUNITY): Payer: Self-pay

## 2021-07-21 ENCOUNTER — Other Ambulatory Visit (HOSPITAL_COMMUNITY): Payer: Self-pay

## 2021-07-26 ENCOUNTER — Other Ambulatory Visit (HOSPITAL_COMMUNITY): Payer: Self-pay

## 2021-08-16 ENCOUNTER — Encounter: Payer: Self-pay | Admitting: Internal Medicine

## 2021-08-29 ENCOUNTER — Encounter: Payer: Self-pay | Admitting: Internal Medicine

## 2021-09-06 ENCOUNTER — Telehealth: Payer: Self-pay | Admitting: *Deleted

## 2021-09-06 NOTE — Telephone Encounter (Signed)
Patient called and notified, colon and PV cancelled and office nurse will call her with new appointments at hospital.

## 2021-09-06 NOTE — Telephone Encounter (Signed)
John,  Please review. Okay for [place]?  Thanks,Kailey Esquilin pv

## 2021-09-06 NOTE — Telephone Encounter (Signed)
Robbin,  This pt is a documented difficult intubation and his procedure will need to be done at the hospital.    Thanks,  Daveyon Kitchings 

## 2021-09-06 NOTE — Telephone Encounter (Signed)
Direct hospital ok  Thanks

## 2021-09-06 NOTE — Telephone Encounter (Signed)
Dr.Pyrtle,  Per Osvaldo Angst, CRNA patient has difficult intubation and needs recall colon at hospital. [facility] for direct hospital or OV? Please advise. Thank you, [name] pv

## 2021-09-15 ENCOUNTER — Other Ambulatory Visit: Payer: Self-pay

## 2021-09-15 ENCOUNTER — Other Ambulatory Visit (HOSPITAL_COMMUNITY): Payer: Self-pay

## 2021-09-15 DIAGNOSIS — Z1211 Encounter for screening for malignant neoplasm of colon: Secondary | ICD-10-CM

## 2021-09-15 MED ORDER — OLOPATADINE HCL 0.2 % OP SOLN
1.0000 [drp] | Freq: Every day | OPHTHALMIC | 1 refills | Status: DC
  Filled 2021-09-15: qty 5, 50d supply, fill #0
  Filled 2021-09-20: qty 5, 100d supply, fill #0

## 2021-09-15 NOTE — Telephone Encounter (Signed)
Pt scheduled for previsit 11/06/21 at 1pm. Colon scheduled at [facility] 11/21/21 at 9:45am. Attemtped to call pt with appt but unable to reach pt or leave a message as voice mailbox is full. Will try again later.

## 2021-09-19 NOTE — Telephone Encounter (Signed)
Left message for pt to call back.  Spoke with pt and she is aware of appts.

## 2021-09-20 ENCOUNTER — Other Ambulatory Visit (HOSPITAL_COMMUNITY): Payer: Self-pay

## 2021-09-20 ENCOUNTER — Other Ambulatory Visit: Payer: Self-pay | Admitting: Emergency Medicine

## 2021-09-20 NOTE — Telephone Encounter (Signed)
Patient returned your call, patient is requesting a call back after 4. Please advise.

## 2021-09-21 ENCOUNTER — Other Ambulatory Visit (HOSPITAL_COMMUNITY): Payer: Self-pay

## 2021-09-21 ENCOUNTER — Other Ambulatory Visit (HOSPITAL_COMMUNITY)
Admission: RE | Admit: 2021-09-21 | Discharge: 2021-09-21 | Disposition: A | Discharge status: Home / Self Care | Payer: Commercial Managed Care - HMO | Source: Ambulatory Visit | Attending: Obstetrics and Gynecology | Admitting: Obstetrics and Gynecology

## 2021-09-21 ENCOUNTER — Ambulatory Visit (INDEPENDENT_AMBULATORY_CARE_PROVIDER_SITE_OTHER): Payer: Managed Care, Other (non HMO)

## 2021-09-21 DIAGNOSIS — R82998 Other abnormal findings in urine: Secondary | ICD-10-CM

## 2021-09-21 DIAGNOSIS — R350 Frequency of micturition: Secondary | ICD-10-CM | POA: Diagnosis not present

## 2021-09-21 LAB — POCT URINALYSIS DIPSTICK
Bilirubin, UA: NEGATIVE
Blood, UA: NEGATIVE
Glucose, UA: NEGATIVE
Ketones, UA: NEGATIVE
Nitrite, UA: NEGATIVE
Protein, UA: NEGATIVE
Spec Grav, UA: 1.025 (ref 1.010–1.025)
Urobilinogen, UA: 0.2 E.U./dL
pH, UA: 6 (ref 5.0–8.0)

## 2021-09-21 MED ORDER — NITROFURANTOIN MONOHYD MACRO 100 MG PO CAPS
100.0000 mg | ORAL_CAPSULE | Freq: Two times a day (BID) | ORAL | 0 refills | Status: DC
  Filled 2021-09-21: qty 10, 5d supply, fill #0

## 2021-09-21 NOTE — Patient Instructions (Addendum)
Your Urine dip that was done in office was positive. I am sending the urine off for culture and you can take AZO over the counter for your discomfort. [name] have prescribed Macrobid for you to take by mouth 100 mg twice a day. We will contact you when the results are back between 3-5 days. If a different antibiotic is needed we will sent the order to the pharmacy and you will be notified. If you have any questions or concerns please feel free to call us at [phone]

## 2021-09-21 NOTE — Progress Notes (Signed)
Danielle Mason is a 61 y.o. female arrived today with UTI sx.  A urine specimen was collected and POCT Urine was done and urine culture sent to the lab. POCT Urine was Positive

## 2021-09-21 NOTE — Telephone Encounter (Signed)
Patient returned your call.  Please call back.  Thank you. 

## 2021-09-21 NOTE — Addendum Note (Signed)
Addended by: Elita Quick on: 09/21/2021 02:03 PM   Modules accepted: Orders

## 2021-09-22 ENCOUNTER — Other Ambulatory Visit (HOSPITAL_COMMUNITY): Payer: Self-pay

## 2021-09-23 LAB — URINE CULTURE: Culture: 20000 — AB

## 2021-09-26 ENCOUNTER — Other Ambulatory Visit (HOSPITAL_COMMUNITY): Payer: Self-pay

## 2021-10-13 ENCOUNTER — Other Ambulatory Visit (HOSPITAL_COMMUNITY): Payer: Self-pay

## 2021-10-13 ENCOUNTER — Other Ambulatory Visit: Payer: Self-pay | Admitting: Student

## 2021-10-13 DIAGNOSIS — M170 Bilateral primary osteoarthritis of knee: Secondary | ICD-10-CM

## 2021-10-13 MED ORDER — IBUPROFEN 600 MG PO TABS
600.0000 mg | ORAL_TABLET | Freq: Three times a day (TID) | ORAL | 0 refills | Status: DC | PRN
  Filled 2021-10-13 – 2021-11-23 (×2): qty 30, 10d supply, fill #0

## 2021-10-14 ENCOUNTER — Emergency Department (HOSPITAL_BASED_OUTPATIENT_CLINIC_OR_DEPARTMENT_OTHER)
Admission: EM | Admit: 2021-10-14 | Discharge: 2021-10-14 | Disposition: A | Discharge status: Home / Self Care | Payer: Commercial Managed Care - HMO | Attending: Emergency Medicine | Admitting: Emergency Medicine

## 2021-10-14 ENCOUNTER — Other Ambulatory Visit (HOSPITAL_COMMUNITY): Payer: Self-pay

## 2021-10-14 ENCOUNTER — Encounter (HOSPITAL_BASED_OUTPATIENT_CLINIC_OR_DEPARTMENT_OTHER): Payer: Self-pay

## 2021-10-14 ENCOUNTER — Other Ambulatory Visit: Payer: Self-pay

## 2021-10-14 DIAGNOSIS — Z79899 Other long term (current) drug therapy: Secondary | ICD-10-CM | POA: Insufficient documentation

## 2021-10-14 DIAGNOSIS — J069 Acute upper respiratory infection, unspecified: Secondary | ICD-10-CM | POA: Diagnosis not present

## 2021-10-14 DIAGNOSIS — Z7982 Long term (current) use of aspirin: Secondary | ICD-10-CM | POA: Insufficient documentation

## 2021-10-14 DIAGNOSIS — Z20822 Contact with and (suspected) exposure to covid-19: Secondary | ICD-10-CM | POA: Insufficient documentation

## 2021-10-14 DIAGNOSIS — Z91040 Latex allergy status: Secondary | ICD-10-CM | POA: Diagnosis not present

## 2021-10-14 DIAGNOSIS — J029 Acute pharyngitis, unspecified: Secondary | ICD-10-CM | POA: Diagnosis present

## 2021-10-14 LAB — GROUP A STREP BY PCR: Group A Strep by PCR: NOT DETECTED

## 2021-10-14 LAB — SARS CORONAVIRUS 2 BY RT PCR: SARS Coronavirus 2 by RT PCR: NEGATIVE

## 2021-10-14 MED ORDER — BENZONATATE 100 MG PO CAPS
100.0000 mg | ORAL_CAPSULE | Freq: Three times a day (TID) | ORAL | 0 refills | Status: DC
  Filled 2021-10-14: qty 21, 7d supply, fill #0

## 2021-10-14 MED ORDER — BENZONATATE 100 MG PO CAPS: 100.0000 mg | ORAL_CAPSULE | Freq: Three times a day (TID) | ORAL | 0 refills | Status: DC

## 2021-10-14 NOTE — ED Triage Notes (Addendum)
Pt arrives from home - ambulatory from waiting room-- c/o chest congestion, sore throat, sinus pressure and cough.  Symptom onset x2 days.  Known sick contact with spouse (COVID NEGATIVE).  Denies fever, denies n/v/d.   OTC Robitussin taken at home for symptom relief (last dose - 1 day pta)

## 2021-10-14 NOTE — ED Provider Notes (Signed)
[facility] Provider Note   CSN: [id number] Arrival date & time: 10/14/21  4967     History  Chief Complaint  Patient presents with   Sore Throat    Danielle Mason is a 61 y.o. female.  Patient is a 61 year old female who presents with URI symptoms.  She has a 2-day history of some runny nose congestion and sneezing with some coughing.  Her cough is mostly nonproductive.  No associated shortness of breath.  No known fevers.  No vomiting or diarrhea.  Her husband has similar symptoms.       Home Medications Prior to Admission medications   Medication Sig Start Date End Date Taking? Authorizing Provider  benzonatate (TESSALON) 100 MG capsule Take 1 capsule (100 mg total) by mouth every 8 (eight) hours. 10/14/21  Yes Malvin Johns, MD  acetaminophen (TYLENOL) 325 MG tablet Take 325 mg by mouth every 6 (six) hours as needed for mild pain or headache.    [provider]  aspirin EC 81 MG tablet Take 81 mg by mouth daily.    [provider]  atorvastatin (LIPITOR) 40 MG tablet Take 1 tablet (40 mg total) by mouth daily. 04/11/21   Iona Beard, MD  augmented betamethasone dipropionate (DIPROLENE-AF) 0.05 % ointment Apply to area twice a day for two weeks, then once a day for a week, then twice a week for two weeks. 07/02/20   Guss Bunde, MD  estradiol (ESTRACE VAGINAL) 0.1 MG/GM vaginal cream Place 2 g vaginally daily. Patient not taking: Reported on 04/20/2021 06/03/20   Guss Bunde, MD  hydrochlorothiazide (HYDRODIURIL) 25 MG tablet Take 1 tablet (25 mg total) by mouth in the morning. 12/13/20   Iona Beard, MD  ibuprofen (ADVIL) 600 MG tablet Take 1 tablet (600 mg total) by mouth every 8 (eight) hours as needed. 10/13/21   Iona Beard, MD  nitrofurantoin, macrocrystal-monohydrate, (MACROBID) 100 MG capsule Take 1 capsule (100 mg total) by mouth 2 (two) times daily. 09/21/21   Jaquita Folds, MD  Olopatadine HCl 0.2 %  SOLN Place 1 drop into both eyes daily. 09/15/21     dicyclomine (BENTYL) 20 MG tablet Take 1 tablet (20 mg total) by mouth 2 (two) times daily. Patient not taking: No sig reported 04/12/20 06/27/20  Margarita Mail, PA-C      Allergies    Eicosapentaenoic acid (epa), Fish-derived products, Penicillins, and Latex    Review of Systems   Review of Systems  Constitutional:  Negative for chills, diaphoresis, fatigue and fever.  HENT:  Positive for congestion, ear pain, rhinorrhea, sneezing and sore throat.   Eyes: Negative.   Respiratory:  Positive for cough. Negative for chest tightness and shortness of breath.   Cardiovascular:  Negative for chest pain and leg swelling.  Gastrointestinal:  Negative for abdominal pain, blood in stool, diarrhea, nausea and vomiting.  Genitourinary:  Negative for difficulty urinating, flank pain, frequency and hematuria.  Musculoskeletal:  Negative for arthralgias and back pain.  Skin:  Negative for rash.  Neurological:  Negative for dizziness, speech difficulty, weakness, numbness and headaches.    Physical Exam Updated Vital Signs BP (!) 153/98 (BP Location: Right Arm)   Pulse 97   Temp 98.2 F (36.8 C) (Oral)   Resp 16   Ht '5\' 2"'$  (1.575 m)   Wt 98 kg   SpO2 96%   BMI 39.52 kg/m  Physical Exam Constitutional:      Appearance: She is well-developed.  HENT:  Head: Normocephalic and atraumatic.     Right Ear: Tympanic membrane normal.     Left Ear: Tympanic membrane normal.     Nose: Congestion present.     Mouth/Throat:     Mouth: Mucous membranes are moist. No oral lesions.     Pharynx: Uvula midline. No oropharyngeal exudate or posterior oropharyngeal erythema.  Eyes:     Pupils: Pupils are equal, round, and reactive to light.  Cardiovascular:     Rate and Rhythm: Normal rate and regular rhythm.     Heart sounds: Normal heart sounds.  Pulmonary:     Effort: Pulmonary effort is normal. No respiratory distress.     Breath sounds: Normal  breath sounds. No wheezing or rales.  Chest:     Chest wall: No tenderness.  Abdominal:     General: Bowel sounds are normal.     Palpations: Abdomen is soft.     Tenderness: There is no abdominal tenderness. There is no guarding or rebound.  Musculoskeletal:        General: Normal range of motion.     Cervical back: Normal range of motion and neck supple.  Lymphadenopathy:     Cervical: No cervical adenopathy.  Skin:    General: Skin is warm and dry.     Findings: No rash.  Neurological:     Mental Status: She is alert and oriented to person, place, and time.     ED Results / Procedures / Treatments   Labs (all labs ordered are listed, but only abnormal results are displayed) Labs Reviewed  SARS CORONAVIRUS 2 BY RT PCR  GROUP A STREP BY PCR    EKG None  Radiology No results found.  Procedures Procedures    Medications Ordered in ED Medications - No data to display  ED Course/ Medical Decision Making/ A&P                           Medical Decision Making Risk Prescription drug management.   Patient is a 61 year old female who presents with URI symptoms.  Her throat exam is benign.  No signs of a deeper tissue infection.  Her lungs are clear without suggestions of pneumonia.  She is nontoxic-appearing.  She is afebrile.  No suggestions of meningitis.  She had a COVID and strep test which were both negative.  She is otherwise well-appearing.  I feel that she can be treated as an outpatient.  She was advised in symptomatic care management.  She did request a prescription for cough medicine and was given a prescription for [employer]  Return precautions were given.  Final Clinical Impression(s) / ED Diagnoses Final diagnoses:  Viral upper respiratory tract infection    Rx / DC Orders ED Discharge Orders          Ordered    benzonatate (TESSALON) 100 MG capsule  Every 8 hours        10/14/21 0801              Malvin Johns, MD 10/14/21 [phone]

## 2021-10-14 NOTE — ED Notes (Signed)
Dc instructions reviewed with patient. Patient voiced understanding. Dc with belongings.  °

## 2021-10-16 ENCOUNTER — Other Ambulatory Visit (HOSPITAL_COMMUNITY): Payer: Self-pay

## 2021-10-16 ENCOUNTER — Ambulatory Visit (INDEPENDENT_AMBULATORY_CARE_PROVIDER_SITE_OTHER): Payer: Commercial Managed Care - HMO | Admitting: Student

## 2021-10-16 ENCOUNTER — Encounter: Payer: Self-pay | Admitting: Student

## 2021-10-16 DIAGNOSIS — B349 Viral infection, unspecified: Secondary | ICD-10-CM

## 2021-10-16 DIAGNOSIS — H9203 Otalgia, bilateral: Secondary | ICD-10-CM

## 2021-10-16 DIAGNOSIS — R070 Pain in throat: Secondary | ICD-10-CM | POA: Diagnosis not present

## 2021-10-16 DIAGNOSIS — R059 Cough, unspecified: Secondary | ICD-10-CM | POA: Diagnosis not present

## 2021-10-16 DIAGNOSIS — J069 Acute upper respiratory infection, unspecified: Secondary | ICD-10-CM

## 2021-10-16 DIAGNOSIS — B9789 Other viral agents as the cause of diseases classified elsewhere: Secondary | ICD-10-CM

## 2021-10-16 MED ORDER — GUAIFENESIN-CODEINE 100-10 MG/5ML PO SOLN
10.0000 mL | Freq: Every evening | ORAL | 0 refills | Status: DC | PRN
  Filled 2021-10-16 (×2): qty 50, 5d supply, fill #0

## 2021-10-16 MED ORDER — ROBAFEN DM CGH/CHEST CONGEST 10-100 MG/5ML PO LIQD
10.0000 mL | Freq: Every day | ORAL | 0 refills | Status: DC | PRN
  Filled 2021-10-16: qty 118, 11d supply, fill #0

## 2021-10-16 MED ORDER — GUAIFENESIN-CODEINE 100-10 MG/5ML PO SYRP
15.0000 mL | ORAL_SOLUTION | Freq: Every evening | ORAL | 0 refills | Status: DC | PRN
  Filled 2021-10-16: qty 75, 5d supply, fill #0

## 2021-10-16 MED ORDER — ROBAFEN DM CGH/CHEST CONGEST 10-100 MG/5ML PO LIQD
5.0000 mL | ORAL | 0 refills | Status: DC | PRN
  Filled 2021-10-16: qty 118, 4d supply, fill #0
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
## 2021-10-16 NOTE — Assessment & Plan Note (Addendum)
Assessment: Danielle Mason presents to the clinic after being seen by urgent care 2 days ago, for symptoms of productive cough, sore throat, and bilateral ear pain. It started with a cough 5 days ago that was dry and the following day became productive with light green/clear mucous, bilateral ear pain, and sore throat. Her husband had similar symptoms and was evaluated at the [place], given multiple medications and doing better. She denies fever, chills, shortness of breath, darkening of the mucous. She denies facial pain, nasal congestion, or difficulty swallowing.   On physical exam pertinent findings of erythematous nasal turbinates, unremarkable TM's and throat. Lungs clear to auscultation. Strep pharyngitis swab and covid test negative when evaluated by urgent care few days ago. Do not believe she warrants chest xray or further testing at this time.  Overall clinical pictures seems consistent with viral URI. She tried otc robitussin DM and tessalon pearls without resolution of her symptoms. Most concerning symptom is the cough, not allowing her to sleep. Recommended continuing robitussin DM and in addition will write 5 day course of robitussin+codeine. Instructed to only take before bed and not before work. We also discussed if symptoms worsen or new symptoms arise she needs to call clinic. She has follow up on 7/10 with me. Do not believe she warrants abx at this time.   Plan: -robitussin AC for 5 days, robitussin dm in the morning -if symptoms worsen, consider conversion from viral to bacterial infection and addition of abx.  -return precautions given

## 2021-10-16 NOTE — Patient Instructions (Signed)
Thank you, [name] for allowing [place] to provide your care today. Today we discussed  Viral Infection I am so sorry you are not feeling better and believe you have a viral infection. I will be writing you for a liquid cough medicine to help with the cough throughout the day and night. If you notice any new symptoms I would like you to call our clinic to be seen. Please use cough drops in between the cough syrup to help with that. Take tylenol for ear pain every 6 hours as needed.   Please reach out if you need anything further. Feel free to keep our appointment on the 10th or cancel if you start feeling better.   I have ordered the following labs for you:   Referrals ordered today:   Referral Orders  No referral(s) requested today     I have ordered the following medication/changed the following medications:   Stop the following medications: There are no discontinued medications.   Start the following medications: No orders of the defined types were placed in this encounter.    Follow up:  7 days    Should you have any questions or concerns please call the internal medicine clinic at [phone].    Sanjuana Letters, D.O. [place]

## 2021-10-18 ENCOUNTER — Telehealth: Payer: Self-pay | Admitting: Student

## 2021-10-18 NOTE — Progress Notes (Signed)
Internal Medicine Clinic Attending  Case discussed with Dr. Katsadouros  At the time of the visit.  We reviewed the resident's history and exam and pertinent patient test results.  I agree with the assessment, diagnosis, and plan of care documented in the resident's note.  

## 2021-10-18 NOTE — Telephone Encounter (Signed)
Pt requesting a call back.  Pt states her cough is no better and the following medications are not working:  guaiFENesin-codeine 100-10 MG/5ML syrup  dextromethorphan-guaiFENesin (ROBAFEN DM [facility]/CHEST CONGEST) 10-100 MG/5ML liquid

## 2021-10-18 NOTE — Telephone Encounter (Signed)
Spoke with Danielle Mason. She is still having a productive cough, no fevers, chills or other new symptoms. We had a follow up scheduled for 07/10. Will speak with front desk about seeing this Friday since not improving.

## 2021-10-20 ENCOUNTER — Other Ambulatory Visit (HOSPITAL_COMMUNITY): Payer: Self-pay

## 2021-10-20 ENCOUNTER — Ambulatory Visit (INDEPENDENT_AMBULATORY_CARE_PROVIDER_SITE_OTHER): Payer: Commercial Managed Care - HMO | Admitting: Student

## 2021-10-20 DIAGNOSIS — J069 Acute upper respiratory infection, unspecified: Secondary | ICD-10-CM

## 2021-10-20 DIAGNOSIS — E119 Type 2 diabetes mellitus without complications: Secondary | ICD-10-CM

## 2021-10-20 DIAGNOSIS — B9789 Other viral agents as the cause of diseases classified elsewhere: Secondary | ICD-10-CM | POA: Diagnosis not present

## 2021-10-20 MED ORDER — ROBAFEN DM CGH/CHEST CONGEST 10-100 MG/5ML PO LIQD
10.0000 mL | ORAL | 0 refills | Status: DC | PRN
  Filled 2021-10-20: qty 118, 2d supply, fill #0

## 2021-10-20 NOTE — Assessment & Plan Note (Signed)
Assessment: Patient follows up today after being diagnosed with upper viral infection 4 days ago. Her cough has improved, is less frequent and less productive. She does endorse some left sided throat discomfort that occurs with coughing. Tylenol helps improve this pain. Is not having any respiratory difficulties or trouble swallowing.  She did have some clear drainage from he left eye as well. Physical exam, mucous membranes moist, no pharyngeal exudates or erythema present. No obvious swelling or deformity to the left neck. Lungs are clear to auscultation.   She is recovering well, given strict return precautions and refilled robitussin DM.    Plan: -continue robitussin DM -given strict return precautions

## 2021-10-20 NOTE — Patient Instructions (Signed)
Thank you, [name] for allowing [place] to provide your care today. Today we discussed.  Acute Bronchitis    Please continue the cough medicine. I am hopeful your cough will completely resolve by Sunday. Please continue to take lozenges as well as drinking hot tea or honey for your sore throat. If you notice your neck swelling, trouble swallowing or breathing, please call us immediately.   I have ordered the following labs for you:  Lab Orders  No laboratory test(s) ordered today     Referrals ordered today:   Referral Orders  No referral(s) requested today     I have ordered the following medication/changed the following medications:   Stop the following medications: There are no discontinued medications.   Start the following medications: No orders of the defined types were placed in this encounter.    Follow up:  1 month for diabetes check    Should you have any questions or concerns please call the internal medicine clinic at [phone].    Sanjuana Letters, D.O. [place]
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
## 2021-10-20 NOTE — Assessment & Plan Note (Signed)
Assessment: Patient declined A1c recheck today, discussed following up in the next month to have this rechecked.   Plan: -follow up for diabetes

## 2021-10-23 ENCOUNTER — Encounter: Payer: Commercial Managed Care - HMO | Admitting: Student

## 2021-10-23 NOTE — Progress Notes (Signed)
Internal Medicine Clinic Attending  Case discussed with Dr. Katsadouros  At the time of the visit.  We reviewed the resident's history and exam and pertinent patient test results.  I agree with the assessment, diagnosis, and plan of care documented in the resident's note.  

## 2021-10-25 ENCOUNTER — Other Ambulatory Visit (HOSPITAL_COMMUNITY): Payer: Self-pay

## 2021-10-25 MED ORDER — TOBRAMYCIN 0.3 % OP SOLN
1.0000 [drp] | Freq: Four times a day (QID) | OPHTHALMIC | 0 refills | Status: DC
Start: 2021-10-25 — End: 2022-01-30
  Filled 2021-10-25: qty 5, 13d supply, fill #0

## 2021-11-01 ENCOUNTER — Other Ambulatory Visit (HOSPITAL_COMMUNITY): Payer: Self-pay

## 2021-11-01 ENCOUNTER — Other Ambulatory Visit: Payer: Self-pay

## 2021-11-01 ENCOUNTER — Ambulatory Visit (INDEPENDENT_AMBULATORY_CARE_PROVIDER_SITE_OTHER): Payer: Commercial Managed Care - HMO | Admitting: Internal Medicine

## 2021-11-01 VITALS — BP 134/84 | HR 78 | Temp 97.8°F | Ht 62.0 in | Wt 216.2 lb

## 2021-11-01 DIAGNOSIS — E119 Type 2 diabetes mellitus without complications: Secondary | ICD-10-CM | POA: Diagnosis not present

## 2021-11-01 DIAGNOSIS — K1120 Sialoadenitis, unspecified: Secondary | ICD-10-CM

## 2021-11-01 LAB — POCT GLYCOSYLATED HEMOGLOBIN (HGB A1C): Hemoglobin A1C: 7 % — AB (ref 4.0–5.6)

## 2021-11-01 LAB — GLUCOSE, CAPILLARY: Glucose-Capillary: 109 mg/dL — ABNORMAL HIGH (ref 70–99)

## 2021-11-01 MED ORDER — LEVOFLOXACIN 500 MG PO TABS
500.0000 mg | ORAL_TABLET | Freq: Every day | ORAL | 0 refills | Status: DC
  Filled 2021-11-01: qty 10, 10d supply, fill #0

## 2021-11-01 MED ORDER — METRONIDAZOLE 500 MG PO TABS
500.0000 mg | ORAL_TABLET | Freq: Three times a day (TID) | ORAL | 0 refills | Status: DC
  Filled 2021-11-01: qty 30, 10d supply, fill #0

## 2021-11-01 NOTE — Progress Notes (Deleted)
HTN -HCTZ 25 mg  T2DM- declined A1c 10/20/2021, 6.3 12/2020. Repeat today?  HLD- lipids 2019 LDL 129 -atorvostatin 40 mg daily  HCM -colonoscopy 11/2021 -pap -mammo -footexam

## 2021-11-01 NOTE — Patient Instructions (Addendum)
Thank you, Danielle Mason for allowing [place] to provide your care today. Today we discussed:  Infection of parotid gland: I will send in antibiotic for this. Take levofloxacin 1 time daily for 10 days. Take metronidazole 3 times daily for 10 days. Follow-up in 1 week with me.  Possible Sjogrens: We got some blood work today to look into this. I will call you with results.  I have ordered the following labs for you:  Lab Orders         ANA         Sjogrens syndrome-A extractable nuclear antibody         POC Hbg A1C        Referrals ordered today:   Referral Orders  No referral(s) requested today     I have ordered the following medication/changed the following medications:   Stop the following medications: Medications Discontinued During This Encounter  Medication Reason   nitrofurantoin, macrocrystal-monohydrate, (MACROBID) 100 MG capsule Completed Course     Start the following medications: Meds ordered this encounter  Medications   metroNIDAZOLE (FLAGYL) 500 MG tablet    Sig: Take 1 tablet (500 mg total) by mouth 3 (three) times daily.    Dispense:  30 tablet    Refill:  0   levofloxacin (LEVAQUIN) 500 MG tablet    Sig: Take 1 tablet (500 mg total) by mouth daily.    Dispense:  10 tablet    Refill:  0     Follow up:  1 week     We look forward to seeing you next time. Please call our clinic at [phone] if you have any questions or concerns. The best time to call is Monday-Friday from 9am-4pm, but there is someone available 24/7. If after hours or the weekend, call the main hospital number and ask for the Internal Medicine Resident On-Call. If you need medication refills, please notify your pharmacy one week in advance and they will send [place] a request.   Thank you for trusting me with your care. Wishing you the best!   Christiana Fuchs, [place]

## 2021-11-01 NOTE — Assessment & Plan Note (Signed)
Patient with history of type 2 diabetes that was previously diet controlled.  She wanted to repeat hemoglobin A1c today.  Hemoglobin A1c increased from 6.3-7.  I will call and talk with patient about increase and if she is interested in medications or would like to try lifestyle modifications first.
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
## 2021-11-01 NOTE — Assessment & Plan Note (Addendum)
Danielle Mason is a 61 year old with past medical history of type 2 diabetes who presents with 3-week history of nasal congestion, throat soreness, and bilateral ear pain.  She states that she came to clinic at the beginning of July with nasal congestion and was diagnosed with a viral respiratory infection at that time.  Since then she has been using Robitussin daily without improvement in her symptoms.  She was also tested for COVID at that time and was negative.  Her husband also had symptoms that were similar but his is since resolved.  She feels that the left side of her face is swelling and her ears are starting to hurt worse.  She denies nausea, fever, and chills.  She endorses dry eyes.  No tenderness of her teeth. On exam she has tenderness to palpation of left parotid gland and it is notably enlarged and firm, right parotid gland is also firm but nontender, tympanic membranes are intact bilaterally with no effusion present, right tympanic membrane does have erythema along ear canal, her left tonsil is difficult to visualize as her tongue is more swollen on that side, tonsil stone present on left side.  No purulent drainage notable from Stensen's duct. A/P: Her presentation is concerning for suppurative parotitis.  She could also have sialolithiasis that is leading to unilateral parotid swelling.  Parotid swelling could also be related to diabetes.  Patient endorses history of dry eyes and parotid glands feel abnormal bilaterally.  It is possible that she could have Sjogren's syndrome.    Patient is allergic to penicillin with reaction in childhood leading to hospitalization, patient states that it affected her breathing.  First-line antibiotic would be Augmentin. Start levofloxacin 500 mg daily and metronidazole 500 mg 3 times daily for 10 days Follow-up in 1 week.  If no improvement would consider imaging with ultrasound to further evaluate ductal stones or mass. -ANA, anti-Ro/SSA and  anti-La/SSB  Addendum 7/26: ANA, anti-Ro, anti-La negative

## 2021-11-02 ENCOUNTER — Encounter: Payer: Managed Care, Other (non HMO) | Admitting: Internal Medicine

## 2021-11-02 LAB — SJOGRENS SYNDROME-A EXTRACTABLE NUCLEAR ANTIBODY: ENA SSA (RO) Ab: <0.2 AI (ref 0.0–0.9)

## 2021-11-02 LAB — SJOGRENS SYNDROME-B EXTRACTABLE NUCLEAR ANTIBODY: ENA SSB (LA) Ab: <0.2 AI (ref 0.0–0.9)

## 2021-11-02 LAB — ANA: Anti Nuclear Antibody (ANA): POSITIVE — AB

## 2021-11-03 LAB — SPECIMEN STATUS REPORT

## 2021-11-03 LAB — ANTINUCLEAR ANTIBODIES, IFA: ANA Titer 1: NEGATIVE

## 2021-11-06 ENCOUNTER — Ambulatory Visit (AMBULATORY_SURGERY_CENTER): Payer: Self-pay | Admitting: *Deleted

## 2021-11-06 ENCOUNTER — Other Ambulatory Visit (HOSPITAL_COMMUNITY): Payer: Self-pay

## 2021-11-06 VITALS — Ht 62.0 in | Wt 215.0 lb

## 2021-11-06 DIAGNOSIS — Z86010 Personal history of colonic polyps: Secondary | ICD-10-CM

## 2021-11-06 MED ORDER — PLENVU 140 G PO SOLR
1.0000 | ORAL | 0 refills | Status: DC
  Filled 2021-11-06: qty 3, 1d supply, fill #0

## 2021-11-06 NOTE — Progress Notes (Signed)
No egg or soy allergy known to patient  No issues known to pt with past sedation with any surgeries or procedures Patient denies ever being told they had issues or difficulty with intubation  No FH of Malignant Hyperthermia Pt is not on diet pills Pt is not on  home 02  Pt is not on blood thinners  Pt denies issues with constipation  No A fib or A flutter   Discussed with pt there will be an out-of-pocket cost for prep and that varies from $0 to 70 +  dollars - pt verbalized understanding    PV completed in person. Pt verified name, DOB.  Procedure explained to pt. Prep instructions reviewed, questions answered. Pt encouraged to call with questions or issues.  If pt has My chart, procedure instructions sent via My Chart

## 2021-11-07 ENCOUNTER — Other Ambulatory Visit (HOSPITAL_COMMUNITY): Payer: Self-pay

## 2021-11-09 NOTE — Progress Notes (Signed)
Internal Medicine Clinic Attending  I saw and evaluated the patient.  I personally confirmed the key portions of the history and exam documented by Dr. Masters and I reviewed pertinent patient test results.  The assessment, diagnosis, and plan were formulated together and I agree with the documentation in the resident's note.  

## 2021-11-13 ENCOUNTER — Encounter: Payer: Self-pay | Admitting: Internal Medicine

## 2021-11-14 ENCOUNTER — Encounter (HOSPITAL_COMMUNITY): Payer: Self-pay | Admitting: Internal Medicine

## 2021-11-15 ENCOUNTER — Telehealth: Payer: Self-pay | Admitting: Internal Medicine

## 2021-11-15 ENCOUNTER — Other Ambulatory Visit: Payer: Self-pay

## 2021-11-15 ENCOUNTER — Ambulatory Visit (INDEPENDENT_AMBULATORY_CARE_PROVIDER_SITE_OTHER): Payer: Commercial Managed Care - HMO | Admitting: Student

## 2021-11-15 ENCOUNTER — Encounter: Payer: Self-pay | Admitting: Student

## 2021-11-15 DIAGNOSIS — K1120 Sialoadenitis, unspecified: Secondary | ICD-10-CM | POA: Diagnosis not present

## 2021-11-15 NOTE — Progress Notes (Signed)
Internal Medicine Clinic Attending  I saw and evaluated the patient.  I personally confirmed the key portions of the history and exam documented by Dr. Simeon Craft and I reviewed pertinent patient test results.  The assessment, diagnosis, and plan were formulated together and I agree with the documentation in the resident's note.
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
## 2021-11-15 NOTE — Telephone Encounter (Signed)
Pts Colon appt moved to 01/18/22 at 10:30am at [facility]. Pt aware of appt. New instructions sent to pt via mychart.

## 2021-11-15 NOTE — Patient Instructions (Signed)
Thank you, Danielle Mason for allowing [place] to provide your care today. Today we discussed your previous parotid gland inflammation. It seems you are feeling much better after the antibiotics. We also think this is likely to happen when there are saliva stones in your mouth, which is common. If you feel like a similar discomfort happens in the future, try:  Take NSAIDs, such as ibuprofen, to help relieve pain and swelling as told by your health care provider. Follow these instructions every few hours: Suck on a lemon candy or a vitamin C lozenge to prompt the flow of saliva. Put a warm, damp cloth (warmcompress) over the gland. Gently massage the gland.  If the above doesn't help, return to clinic to be seen by the doctor.     We look forward to seeing you next time. Please call our clinic at [phone] if you have any questions or concerns. The best time to call is Monday-Friday from 9am-4pm, but there is someone available 24/7. If after hours or the weekend, call the main hospital number and ask for the Internal Medicine Resident On-Call. If you need medication refills, please notify your pharmacy one week in advance and they will send [place] a request.   Thank you for letting us take part in your care. Wishing you the best!  Romana Juniper, MD 11/15/2021, 10:53 AM [facility] Resident, PGY-1

## 2021-11-15 NOTE — Assessment & Plan Note (Addendum)
Patient returning to clinic s/p Levofloxacin and Metronidazole course completion for concern of suppurative parotitis. Patient reports finishing antibiotic course and noticing improvement in throat and ear pain, and facial tenderness. Patient with mild diarrhea after antibiotic completion, now resolved. Exam improved from prior:No tenderness to palpation, erythema, or or enlarged bilateral parotid glands. No evidence of stones or drainage. . Of note, patient continues to endorse dry mouth.  Negative ANA, anti Ro and anti La, less concern for Sjogren syndrome at this time. Counseled patient on risk factors associated with salivary stones, which can block ducts and increase risk of infection. Instructed patient on conservative management, especially lozanges to increase saliva production in the future and return to our clinic if she notes that her initial symptoms return or worsen. - Conservative management - Information packet given to patetient

## 2021-11-21 ENCOUNTER — Other Ambulatory Visit (HOSPITAL_COMMUNITY): Payer: Self-pay

## 2021-11-22 ENCOUNTER — Other Ambulatory Visit (HOSPITAL_COMMUNITY): Payer: Self-pay

## 2021-11-23 ENCOUNTER — Other Ambulatory Visit (HOSPITAL_COMMUNITY): Payer: Self-pay

## 2021-11-24 ENCOUNTER — Other Ambulatory Visit (HOSPITAL_COMMUNITY): Payer: Self-pay

## 2021-11-28 ENCOUNTER — Other Ambulatory Visit (HOSPITAL_COMMUNITY): Payer: Self-pay

## 2021-12-19 ENCOUNTER — Other Ambulatory Visit (HOSPITAL_COMMUNITY): Payer: Self-pay

## 2022-01-04 ENCOUNTER — Other Ambulatory Visit (HOSPITAL_COMMUNITY): Payer: Self-pay

## 2022-01-04 ENCOUNTER — Ambulatory Visit (INDEPENDENT_AMBULATORY_CARE_PROVIDER_SITE_OTHER): Payer: Commercial Managed Care - HMO | Admitting: Student

## 2022-01-04 ENCOUNTER — Other Ambulatory Visit (HOSPITAL_COMMUNITY)
Admission: RE | Admit: 2022-01-04 | Discharge: 2022-01-04 | Disposition: A | Discharge status: Home / Self Care | Payer: Commercial Managed Care - HMO | Source: Ambulatory Visit | Attending: Internal Medicine | Admitting: Internal Medicine

## 2022-01-04 ENCOUNTER — Encounter: Payer: Self-pay | Admitting: Student

## 2022-01-04 VITALS — BP 130/89 | HR 77 | Temp 97.9°F | Ht 62.0 in | Wt 214.5 lb

## 2022-01-04 DIAGNOSIS — N898 Other specified noninflammatory disorders of vagina: Secondary | ICD-10-CM | POA: Insufficient documentation

## 2022-01-04 LAB — POCT URINALYSIS DIPSTICK
Bilirubin, UA: NEGATIVE
Glucose, UA: NEGATIVE
Ketones, UA: NEGATIVE
Nitrite, UA: NEGATIVE
Protein, UA: NEGATIVE
Spec Grav, UA: 1.02 (ref 1.010–1.025)
Urobilinogen, UA: 0.2 E.U./dL
pH, UA: 5.5 (ref 5.0–8.0)

## 2022-01-04 MED ORDER — ESTRADIOL 0.1 MG/GM VA CREA
1.0000 | TOPICAL_CREAM | Freq: Every day | VAGINAL | 12 refills | Status: DC
  Filled 2022-01-04 – 2022-01-09 (×3): qty 42.5, 30d supply, fill #0

## 2022-01-04 NOTE — Patient Instructions (Signed)
Thank you so much for coming to the clinic today!  We talked about a few things today, regarding the odor.  We got a self-swab done today to look for any sort of bacteria side, I will call you to update you on the results.  We also got some urine today, again just looking for any sort of bacteria.  Also sent in order vaginal estrogen cream they can apply once a day to help with the dryness.  I was a pleasure being part of your care today!  You have any questions please call the clinic at [phone]  Best, Dr. Sanjuana Mae

## 2022-01-04 NOTE — Assessment & Plan Note (Addendum)
Patient states that she is having odor coming from her vagina since Monday September 18.  Denies any initiating factor.  She denies any discharge, burning, itchiness, fever, nausea, vomiting.  She also denies having any problems with her bowel movements.  She is not sexually active currently, and is postmenopausal without estrogen therapy.  She states that she has had episodes like this before, although they were more severe at the time and presented with burning and discharge.  The last time she had vaginal odor was 2 months ago, and at the same time she had suppurative parotitis which was treated with antibiotics, and the odor went away consequentially.  She has a history of diabetes, however she is not on any medications.  She checks her blood sugar daily last blood sugar was 119, she states this is a normal range for her.  Plan: - Self swab was done today in the clinic, will call and update patient on results - Urine analysis was done as well as urine dipstick, will update patient's on results - Encouraged patient to refrain from self cleaning such as douching - Prescribed estradiol cream to be used once a day to help with vaginal dryness
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
## 2022-01-05 ENCOUNTER — Other Ambulatory Visit (HOSPITAL_COMMUNITY): Payer: Self-pay

## 2022-01-05 ENCOUNTER — Other Ambulatory Visit: Payer: Self-pay | Admitting: Student

## 2022-01-05 DIAGNOSIS — I10 Essential (primary) hypertension: Secondary | ICD-10-CM

## 2022-01-05 LAB — URINALYSIS, ROUTINE W REFLEX MICROSCOPIC
Bilirubin, UA: NEGATIVE
Glucose, UA: NEGATIVE
Ketones, UA: NEGATIVE
Nitrite, UA: NEGATIVE
Protein,UA: NEGATIVE
RBC, UA: NEGATIVE
Specific Gravity, UA: 1.016 (ref 1.005–1.030)
Urobilinogen, Ur: 0.2 mg/dL (ref 0.2–1.0)
pH, UA: 5.5 (ref 5.0–7.5)

## 2022-01-05 LAB — MICROSCOPIC EXAMINATION
Casts: NONE SEEN /lpf
RBC, Urine: NONE SEEN /hpf (ref 0–2)

## 2022-01-06 MED ORDER — HYDROCHLOROTHIAZIDE 25 MG PO TABS
25.0000 mg | ORAL_TABLET | Freq: Every morning | ORAL | 3 refills | Status: DC
  Filled 2022-01-06: qty 90, 90d supply, fill #0
  Filled 2022-03-28: qty 90, 90d supply, fill #1
  Filled 2022-07-20: qty 30, 30d supply, fill #2

## 2022-01-07 LAB — CERVICOVAGINAL ANCILLARY ONLY
Bacterial Vaginitis (gardnerella): NEGATIVE
Candida Glabrata: NEGATIVE
Candida Vaginitis: NEGATIVE
Chlamydia: NEGATIVE
Comment: NEGATIVE
Comment: NEGATIVE
Comment: NEGATIVE
Comment: NEGATIVE
Comment: NEGATIVE
Comment: NORMAL
Neisseria Gonorrhea: NEGATIVE
Trichomonas: NEGATIVE

## 2022-01-08 ENCOUNTER — Other Ambulatory Visit (HOSPITAL_COMMUNITY): Payer: Self-pay

## 2022-01-08 ENCOUNTER — Telehealth: Payer: Self-pay | Admitting: *Deleted

## 2022-01-08 NOTE — Telephone Encounter (Signed)
HCTZ #90 with 3 refills sent 9/23. Call placed to patient. States she has p/u this Rx.  She states estrace cream is requiring a PA. Will forward to PA staff.

## 2022-01-08 NOTE — Telephone Encounter (Signed)
Patient calling about refill requested last week for blood pressure medicine.

## 2022-01-09 ENCOUNTER — Other Ambulatory Visit (HOSPITAL_COMMUNITY): Payer: Self-pay

## 2022-01-09 NOTE — Telephone Encounter (Signed)
PA request for med came through on cover my meds but meds has been discontinued on 01/04/22

## 2022-01-10 ENCOUNTER — Other Ambulatory Visit (HOSPITAL_COMMUNITY): Payer: Self-pay

## 2022-01-11 ENCOUNTER — Other Ambulatory Visit (HOSPITAL_COMMUNITY): Payer: Self-pay

## 2022-01-11 ENCOUNTER — Other Ambulatory Visit: Payer: Self-pay | Admitting: Student

## 2022-01-11 MED ORDER — ESTRADIOL 0.1 MG/GM VA CREA
1.0000 | TOPICAL_CREAM | Freq: Every day | VAGINAL | 12 refills | Status: DC
  Filled 2022-01-11: qty 42.5, 23d supply, fill #0
  Filled 2022-01-16: qty 42.5, 30d supply, fill #0

## 2022-01-11 NOTE — Telephone Encounter (Signed)
I do not know why it was discontinued I can put in a new order.

## 2022-01-11 NOTE — Telephone Encounter (Signed)
Patient calling again regarding estrace cream that was ordered at [facility] on 9/21. Somehow it was d/c on med list even though PA was approved. Please advise.

## 2022-01-11 NOTE — Progress Notes (Signed)
Internal Medicine Clinic Attending  Case discussed with Dr. Nooruddin  at the time of the visit.  We reviewed the resident's history and exam and pertinent patient test results.  I agree with the assessment, diagnosis, and plan of care documented in the resident's note.  

## 2022-01-12 ENCOUNTER — Other Ambulatory Visit (HOSPITAL_COMMUNITY): Payer: Self-pay

## 2022-01-12 ENCOUNTER — Telehealth: Payer: Self-pay | Admitting: *Deleted

## 2022-01-12 NOTE — Telephone Encounter (Signed)
RTC to patient unable to leave a message.

## 2022-01-16 ENCOUNTER — Telehealth: Payer: Self-pay | Admitting: *Deleted

## 2022-01-16 ENCOUNTER — Other Ambulatory Visit (HOSPITAL_COMMUNITY): Payer: Self-pay

## 2022-01-16 NOTE — Telephone Encounter (Signed)
Call from Pharmacy.  Estradiol Creme will cost patient $100.  Not covered with a PA.  Not on formulary.  Patient will need to be prescribed something else if possible.

## 2022-01-16 NOTE — Telephone Encounter (Signed)
Estradiol dosing is usually 2-4 gms daily for 1-2 weeks.  Then go 50% for 1-2 weeks and then an Maintainance of 1 gm 1 to 3 times a week.   Clarification of details.

## 2022-01-16 NOTE — Telephone Encounter (Signed)
Call from patient was unable to get her Estradiol Creme.    Patient was informed after call to Pharmacy that her insurance does not cover the Creme will need a PA done.  Pharmacy to send over request for PA.  Patient informed and will await call about PA.

## 2022-01-17 ENCOUNTER — Encounter (HOSPITAL_COMMUNITY): Payer: Self-pay | Admitting: Anesthesiology

## 2022-01-17 LAB — HM DIABETES EYE EXAM

## 2022-01-18 ENCOUNTER — Ambulatory Visit (HOSPITAL_COMMUNITY)
Admission: RE | Admit: 2022-01-18 | Payer: Commercial Managed Care - HMO | Source: Home / Self Care | Admitting: Internal Medicine

## 2022-01-18 ENCOUNTER — Telehealth: Payer: Self-pay | Admitting: Internal Medicine

## 2022-01-18 ENCOUNTER — Other Ambulatory Visit: Payer: Self-pay

## 2022-01-18 SURGERY — COLONOSCOPY WITH PROPOFOL
Anesthesia: Monitor Anesthesia Care

## 2022-01-18 NOTE — Telephone Encounter (Signed)
Inbound call from patient , last minute cancellation for today's appointment at [facility] ,need to reschedule it.Patient states she is having a lot going on and don't feel quit good.

## 2022-01-18 NOTE — Telephone Encounter (Signed)
Pts colon rescheduled for 03/29/22 at [facility] at 10:30am. Pt aware of appt. New instructions sent via mychart.

## 2022-01-18 NOTE — Telephone Encounter (Signed)
Pt called and cancelled her appt for today. Let her know we would have to put her on the wait list and call her when the next appt is available. Pt stated that "something came up yesterday." Dr. Norman Herrlich notified. Message left on endo unit voicemail.

## 2022-01-19 ENCOUNTER — Other Ambulatory Visit: Payer: Self-pay | Admitting: Internal Medicine

## 2022-01-19 MED ORDER — ESTRADIOL 10 MCG VA TABS
1.0000 | ORAL_TABLET | Freq: Every day | VAGINAL | 1 refills | Status: DC
  Filled 2022-01-19: qty 8, 8d supply, fill #0
  Filled 2022-02-14: qty 8, 8d supply, fill #1

## 2022-01-19 NOTE — Telephone Encounter (Signed)
Discontinued estradiol cream and prescribed vaginal tablet which is covered by patient's insurance

## 2022-01-20 ENCOUNTER — Other Ambulatory Visit (HOSPITAL_COMMUNITY): Payer: Self-pay

## 2022-01-22 ENCOUNTER — Other Ambulatory Visit (HOSPITAL_COMMUNITY): Payer: Self-pay

## 2022-01-22 NOTE — Telephone Encounter (Addendum)
Call to patient to inform her that the Vaginal creme has been changed t a tablet.  Message was left on patient's VM.

## 2022-01-29 ENCOUNTER — Encounter: Payer: Self-pay | Admitting: Dietician

## 2022-01-29 ENCOUNTER — Other Ambulatory Visit (HOSPITAL_COMMUNITY): Payer: Self-pay

## 2022-01-30 ENCOUNTER — Other Ambulatory Visit: Payer: Self-pay

## 2022-01-30 ENCOUNTER — Telehealth: Payer: Self-pay | Admitting: *Deleted

## 2022-01-30 ENCOUNTER — Other Ambulatory Visit (HOSPITAL_COMMUNITY): Payer: Self-pay

## 2022-01-30 ENCOUNTER — Other Ambulatory Visit (HOSPITAL_COMMUNITY)
Admission: RE | Admit: 2022-01-30 | Discharge: 2022-01-30 | Disposition: A | Discharge status: Home / Self Care | Payer: Commercial Managed Care - HMO | Source: Ambulatory Visit | Attending: Student in an Organized Health Care Education/Training Program | Admitting: Student in an Organized Health Care Education/Training Program

## 2022-01-30 ENCOUNTER — Encounter: Payer: Self-pay | Admitting: Internal Medicine

## 2022-01-30 ENCOUNTER — Ambulatory Visit (INDEPENDENT_AMBULATORY_CARE_PROVIDER_SITE_OTHER): Payer: Commercial Managed Care - HMO | Admitting: Internal Medicine

## 2022-01-30 VITALS — BP 137/93 | HR 75 | Temp 97.6°F | Ht 61.0 in | Wt 209.1 lb

## 2022-01-30 DIAGNOSIS — Z23 Encounter for immunization: Secondary | ICD-10-CM | POA: Insufficient documentation

## 2022-01-30 DIAGNOSIS — Z1231 Encounter for screening mammogram for malignant neoplasm of breast: Secondary | ICD-10-CM | POA: Insufficient documentation

## 2022-01-30 DIAGNOSIS — Z124 Encounter for screening for malignant neoplasm of cervix: Secondary | ICD-10-CM | POA: Insufficient documentation

## 2022-01-30 DIAGNOSIS — I10 Essential (primary) hypertension: Secondary | ICD-10-CM

## 2022-01-30 DIAGNOSIS — E119 Type 2 diabetes mellitus without complications: Secondary | ICD-10-CM

## 2022-01-30 DIAGNOSIS — E782 Mixed hyperlipidemia: Secondary | ICD-10-CM | POA: Diagnosis not present

## 2022-01-30 MED ORDER — ATORVASTATIN CALCIUM 40 MG PO TABS
40.0000 mg | ORAL_TABLET | Freq: Every day | ORAL | 3 refills | Status: DC
  Filled 2022-01-30: qty 90, 90d supply, fill #0
  Filled 2022-05-01: qty 90, 90d supply, fill #1
  Filled 2022-08-29: qty 30, 30d supply, fill #2
  Filled 2022-10-03 (×2): qty 30, 30d supply, fill #3
  Filled 2022-11-02: qty 30, 30d supply, fill #4

## 2022-01-30 MED ORDER — LOSARTAN POTASSIUM 50 MG PO TABS
50.0000 mg | ORAL_TABLET | Freq: Every day | ORAL | 2 refills | Status: DC
  Filled 2022-01-30: qty 30, 30d supply, fill #0
  Filled 2022-03-02: qty 30, 30d supply, fill #1
  Filled 2022-03-28: qty 30, 30d supply, fill #2

## 2022-01-30 MED ORDER — ZOSTER VAC RECOMB ADJUVANTED 50 MCG/0.5ML IM SUSR
0.5000 mL | Freq: Once | INTRAMUSCULAR | 1 refills | Status: AC
  Filled 2022-01-30: qty 0.5, 1d supply, fill #0

## 2022-01-30 NOTE — Assessment & Plan Note (Signed)
Order for Shingrix placed.

## 2022-01-30 NOTE — Telephone Encounter (Signed)
Received call from pt- Over the last few weeks, pt states that she has been eating more salt and ramen noodles  Does not want to start new medication for BP at this time Will work on diet and return in one mth for follow up  Will make prescribing MD aware

## 2022-01-30 NOTE — Assessment & Plan Note (Signed)
Tetanus booster given today. 

## 2022-01-30 NOTE — Assessment & Plan Note (Signed)
Pap smear completed today. 

## 2022-01-30 NOTE — Assessment & Plan Note (Signed)
Mammogram due in April 2024. Order placed today.
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
## 2022-01-30 NOTE — Assessment & Plan Note (Signed)
BP today of 140/83 with repeat of 137/93. Current regimen is HCTZ 25 mg daily.  Assessment: Given her comorbid diabetes, with BP that is borderline, patient would be appropriate for ACEI or ARB therapy for renal protection in addition to hypertension therapy.  Plan:Start losartan 50 mg daily and continue HCTZ 25 mg daily. Will have patient return in 4 weeks for BP recheck.

## 2022-01-30 NOTE — Assessment & Plan Note (Signed)
Lipid panel last checked 08/2017 showed LDL at goal, 94. Current regimen is atorvastatin 40 mg daily. Plan:Continue current regimen. Check lipid panel today.

## 2022-01-30 NOTE — Telephone Encounter (Signed)
Thank you. If she is not going to start spironolactone as discussed at today's visit we do not need 4 week follow up as that was intended to be for BP check up after starting a new medication. She can have check up at a longer interval of 3 months unless she needs to be seen sooner.

## 2022-01-30 NOTE — Patient Instructions (Addendum)
Danielle Mason,  It was a pleasure to care for you today. I'm glad that you are doing well!  I am starting you on a medicine called losartan to help get your blood pressure under better control. It is also protective for your kidneys. Additionally I have sent in medication refills for you and I have sent a prescription to your pharmacy for the shingles vaccines.   I will let you know what the results of your tests from today's visit show when they come back.  Plan to return for check up of your diabetes and high blood pressure in about 3 months.  My best, Dr. Marlou Sa

## 2022-01-30 NOTE — Assessment & Plan Note (Signed)
Most recent HbA1c in 10/2021 was 7.0%. Danielle Mason states that she checks her blood sugar at home and that it is typically in the 110s-120s. She is currently not on medical therapy for diabetes and has adequate diet control. Plan:Repeat HbA1c at next OV. Will check urine microalbumin/creatinine ratio and BMP at this time.

## 2022-01-31 ENCOUNTER — Other Ambulatory Visit (HOSPITAL_COMMUNITY): Payer: Self-pay

## 2022-01-31 LAB — BMP8+ANION GAP
Anion Gap: 17 mmol/L (ref 10.0–18.0)
BUN/Creatinine Ratio: 21 (ref 12–28)
BUN: 16 mg/dL (ref 8–27)
CO2: 22 mmol/L (ref 20–29)
Calcium: 10.2 mg/dL (ref 8.7–10.3)
Chloride: 102 mmol/L (ref 96–106)
Creatinine, Ser: 0.75 mg/dL (ref 0.57–1.00)
Glucose: 124 mg/dL — ABNORMAL HIGH (ref 70–99)
Potassium: 4.2 mmol/L (ref 3.5–5.2)
Sodium: 141 mmol/L (ref 134–144)
eGFR: 89 mL/min/{1.73_m2} (ref >59)

## 2022-01-31 LAB — LIPID PANEL
Chol/HDL Ratio: 3.9 ratio (ref 0.0–4.4)
Cholesterol, Total: 163 mg/dL (ref 100–199)
HDL: 42 mg/dL (ref >39)
LDL Chol Calc (NIH): 101 mg/dL — ABNORMAL HIGH (ref 0–99)
Triglycerides: 111 mg/dL (ref 0–149)
VLDL Cholesterol Cal: 20 mg/dL (ref 5–40)

## 2022-01-31 LAB — MICROALBUMIN / CREATININE URINE RATIO
Creatinine, Urine: 98.2 mg/dL
Microalb/Creat Ratio: 27 mg/g creat (ref 0–29)
Microalbumin, Urine: 26.3 ug/mL

## 2022-01-31 NOTE — Progress Notes (Signed)
BMP overall unremarkable. Renal function is normal. Lipid panel with LDL just above goal at 101; will counsel the patient on lifestyle choices such as healthy diet and exercise to help keep this at goal, combined with her current therapy of lipitor 40 mg daily.

## 2022-02-01 NOTE — Progress Notes (Signed)
Internal Medicine Clinic Attending ? ?Case discussed with Dr. Dean  At the time of the visit.  We reviewed the resident?s history and exam and pertinent patient test results.  I agree with the assessment, diagnosis, and plan of care documented in the resident?s note.  ?

## 2022-02-05 ENCOUNTER — Encounter: Payer: Self-pay | Admitting: *Deleted

## 2022-02-08 ENCOUNTER — Encounter: Payer: Self-pay | Admitting: *Deleted

## 2022-02-08 LAB — CYTOLOGY - PAP
Comment: NEGATIVE
Diagnosis: UNDETERMINED — AB
High risk HPV: NEGATIVE

## 2022-02-09 ENCOUNTER — Other Ambulatory Visit: Payer: Self-pay | Admitting: Internal Medicine

## 2022-02-09 DIAGNOSIS — R87610 Atypical squamous cells of undetermined significance on cytologic smear of cervix (ASC-US): Secondary | ICD-10-CM

## 2022-02-12 ENCOUNTER — Telehealth: Payer: Self-pay | Admitting: Internal Medicine

## 2022-02-12 NOTE — Telephone Encounter (Signed)
Attempted to contact patient regarding results of recent pap smear without success. I have left a message in her MyChart as she is active (last log in 10/29) so that she has information regarding these results in the event we continue to struggle to successfully connect over the phone.  Farrel Gordon, DO

## 2022-02-13 ENCOUNTER — Telehealth: Payer: Self-pay

## 2022-02-13 NOTE — Telephone Encounter (Signed)
Requesting test results, please call pt back.  

## 2022-02-13 NOTE — Telephone Encounter (Signed)
Patient came to clinic via w/c requesting lab results. Explained provider attempted to contact patient regarding results twice but there was no answer and VMB was full. Patient has since cleared VMB messages.  Gave patient printout of results from 01/30/22. Provider had made notes for patient pertaining to all results. These was discussed with patient in detail. All questions answered.

## 2022-02-14 ENCOUNTER — Other Ambulatory Visit (HOSPITAL_COMMUNITY): Payer: Self-pay

## 2022-02-15 ENCOUNTER — Telehealth (INDEPENDENT_AMBULATORY_CARE_PROVIDER_SITE_OTHER): Payer: Commercial Managed Care - HMO

## 2022-02-15 ENCOUNTER — Telehealth: Payer: Self-pay | Admitting: Internal Medicine

## 2022-02-15 ENCOUNTER — Other Ambulatory Visit (HOSPITAL_COMMUNITY): Payer: Self-pay

## 2022-02-15 DIAGNOSIS — R059 Cough, unspecified: Secondary | ICD-10-CM | POA: Diagnosis not present

## 2022-02-15 MED ORDER — DEXTROMETHORPHAN-GUAIFENESIN 10-100 MG/5ML PO LIQD
10.0000 mL | Freq: Four times a day (QID) | ORAL | 0 refills | Status: DC | PRN
  Filled 2022-02-15: qty 236, 6d supply, fill #0

## 2022-02-15 NOTE — Telephone Encounter (Signed)
Ok. Thanks!

## 2022-02-15 NOTE — Telephone Encounter (Signed)
Patient called she stated she is still having a cough after 2 weeks, patient is requesting a different med. Please return patients call
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
## 2022-02-15 NOTE — Telephone Encounter (Signed)
Return pt's call - c/o cough since last Saturday. 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 She has tried OTC cough medication (whole bottle) and has not helped. She's coughing up yellow-green sputum. Voice is hoarse.Denies fever,sob, and h/a's. She has not taken a covid test. No in person appts available today nor tomorrow. Please advise.

## 2022-02-15 NOTE — Telephone Encounter (Signed)
Additional attempt to contact patient to ensure she has understanding of plans moving forward regarding recent lab results however there was no answer and no ability to leave VM.   Farrel Gordon, DO

## 2022-02-16 ENCOUNTER — Telehealth: Payer: Self-pay | Admitting: *Deleted

## 2022-02-16 ENCOUNTER — Other Ambulatory Visit (HOSPITAL_COMMUNITY): Payer: Self-pay

## 2022-02-16 NOTE — Telephone Encounter (Signed)
Call from patient was asking about an additional medication she was to take along with the Robitussin prescription.  Spoke with Dr. Allyson Sabal patient was to also take OTC Mucinex or Dayquil. Patient was also asked to get a Covid Test done.    Patient was informed of  the instructions to get the OTC medications. Patient voiced understanding of the plan.  Patient has not gotten the Covid Test as of yet.

## 2022-02-16 NOTE — Telephone Encounter (Signed)
I agree with Dr. Clinton Sawyer assessment and recommendations

## 2022-02-19 ENCOUNTER — Other Ambulatory Visit (HOSPITAL_COMMUNITY): Payer: Self-pay

## 2022-03-02 ENCOUNTER — Other Ambulatory Visit (HOSPITAL_COMMUNITY): Payer: Self-pay

## 2022-03-05 ENCOUNTER — Telehealth: Payer: Self-pay | Admitting: *Deleted

## 2022-03-05 ENCOUNTER — Encounter: Payer: Commercial Managed Care - HMO | Admitting: Internal Medicine

## 2022-03-05 ENCOUNTER — Telehealth: Payer: Self-pay

## 2022-03-05 NOTE — Telephone Encounter (Signed)
Call from patient c/o swollen finger red since yesterday.  Nose is also swollen since yesterday.  Has taken meds prescribed for.  Has a cough.  Has taken a Covid Test. Negative.    Given an appointment for this afternoon with Dr. Marlou Sa.

## 2022-03-05 NOTE — Telephone Encounter (Signed)
Requesting to speak with a nurse about getting an appt for swollen finger. No open slot, please call pt back.

## 2022-03-06 ENCOUNTER — Other Ambulatory Visit: Payer: Self-pay

## 2022-03-06 ENCOUNTER — Ambulatory Visit (INDEPENDENT_AMBULATORY_CARE_PROVIDER_SITE_OTHER): Payer: Commercial Managed Care - HMO | Admitting: Internal Medicine

## 2022-03-06 ENCOUNTER — Encounter: Payer: Self-pay | Admitting: Internal Medicine

## 2022-03-06 ENCOUNTER — Other Ambulatory Visit (HOSPITAL_COMMUNITY): Payer: Self-pay

## 2022-03-06 VITALS — BP 99/60 | HR 86 | Temp 98.4°F | Resp 24 | Ht 61.0 in | Wt 212.2 lb

## 2022-03-06 DIAGNOSIS — L03012 Cellulitis of left finger: Secondary | ICD-10-CM

## 2022-03-06 MED ORDER — DOXYCYCLINE HYCLATE 100 MG PO TABS
100.0000 mg | ORAL_TABLET | Freq: Two times a day (BID) | ORAL | 0 refills | Status: DC
  Filled 2022-03-06 (×2): qty 10, 5d supply, fill #0

## 2022-03-06 MED ORDER — METRONIDAZOLE 500 MG PO TABS
500.0000 mg | ORAL_TABLET | Freq: Three times a day (TID) | ORAL | 0 refills | Status: AC
  Filled 2022-03-06: qty 15, 5d supply, fill #0
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
## 2022-03-06 NOTE — Progress Notes (Deleted)
Amox-clav 875/125 bid x5d

## 2022-03-06 NOTE — Assessment & Plan Note (Signed)
Patient presents with one week of pain and swelling of her distal left 4th finger. She bit off hangnails from that finger and noticed following this that she had increased erythema, edema, and pain to the entire distal finger with a fluctuance at the matrix. She does not go to a nail salon and she shares finger nail clippers with her husband only. The pain is worse on the medial finger pad and sometimes extends proximally. She denies fevers, chills, trauma. Assessment:Patient has paronychia with fluctuance but I do not suspect abscess. Due to the nature of this infection--obtained and contaminated due to oral flora--she will need antibiotic therapy. No indication for I&D at this time. Plan:Patient to take 5 days of metronidazole 500 TID and doxycycline 100 mg BID. I have also instructed her to soak the finger in warm sitz baths 2-3 times daily. I have advised that she return to reassessment in one week if she does not have improvement with antibiotic therapy, or if the infection gets worse.

## 2022-03-06 NOTE — Patient Instructions (Addendum)
Ms. Wire,  It was a pleasure to see you today!  I have sent in two antibiotics for your finger infection: - doxycycline 100 mg twice daily for 5 days - metronidazole 500 mg three times daily for 5 days  I would like you to soak the finger in a warm sitz bath 2-3 times per day as well.  Please contact the clinic to be re-evaluated if the pain and swelling do not get better with antibiotics, or if they start to get worse.  My best, Dr. Marlou Sa

## 2022-03-07 ENCOUNTER — Telehealth: Payer: Self-pay | Admitting: *Deleted

## 2022-03-07 ENCOUNTER — Other Ambulatory Visit (HOSPITAL_COMMUNITY): Payer: Self-pay

## 2022-03-07 NOTE — Telephone Encounter (Signed)
Relayed all info from PCP below. Also, discussed trying nasal saline lavage to get her through the Holidays. She is very [employer] attorney.

## 2022-03-07 NOTE — Progress Notes (Signed)
Internal Medicine Clinic Attending ? ?Case discussed with Dr. Dean  At the time of the visit.  We reviewed the resident?s history and exam and pertinent patient test results.  I agree with the assessment, diagnosis, and plan of care documented in the resident?s note.  ?

## 2022-03-07 NOTE — Telephone Encounter (Signed)
Patient called in stating she forgot to mention nasal congestion at yesterday's OV. States congestion is waking her up at night, and there is "so much mucous." Also, coughing at night and in AM, but not during the day. Cough is productive of light yellow sputum. Nasal drainage is also light yellow. Denies ear pain, sinus pain, fever, chills. States she has been taking Mucinex without any relief. Patient uses [facility].

## 2022-03-13 ENCOUNTER — Encounter: Payer: Self-pay | Admitting: Student

## 2022-03-13 ENCOUNTER — Ambulatory Visit (INDEPENDENT_AMBULATORY_CARE_PROVIDER_SITE_OTHER): Payer: Commercial Managed Care - HMO | Admitting: Student

## 2022-03-13 ENCOUNTER — Other Ambulatory Visit: Payer: Self-pay

## 2022-03-13 VITALS — BP 111/58 | HR 81 | Temp 97.7°F | Resp 32 | Ht 61.0 in | Wt 213.5 lb

## 2022-03-13 DIAGNOSIS — I10 Essential (primary) hypertension: Secondary | ICD-10-CM

## 2022-03-13 DIAGNOSIS — L03012 Cellulitis of left finger: Secondary | ICD-10-CM | POA: Diagnosis not present

## 2022-03-13 DIAGNOSIS — E119 Type 2 diabetes mellitus without complications: Secondary | ICD-10-CM | POA: Diagnosis not present

## 2022-03-13 NOTE — Patient Instructions (Signed)
Paronychia  We drainage the abscess in your finger today. I expect the swelling will get better in a few days.  Please keep area clean and dry.  Please call the clinic and we can send more antibiotics if your finger feels worse. Avoid biting your nails or hang nails  Your blood pressure is looking good today We will check bloodwork and I will call with results   Please follow up for repeat A1c in a about 1 month

## 2022-03-14 LAB — BMP8+ANION GAP
Anion Gap: 16 mmol/L (ref 10.0–18.0)
BUN/Creatinine Ratio: 22 (ref 12–28)
BUN: 17 mg/dL (ref 8–27)
CO2: 20 mmol/L (ref 20–29)
Calcium: 9.7 mg/dL (ref 8.7–10.3)
Chloride: 103 mmol/L (ref 96–106)
Creatinine, Ser: 0.76 mg/dL (ref 0.57–1.00)
Glucose: 116 mg/dL — ABNORMAL HIGH (ref 70–99)
Potassium: 3.9 mmol/L (ref 3.5–5.2)
Sodium: 139 mmol/L (ref 134–144)
eGFR: 88 mL/min/{1.73_m2} (ref >59)

## 2022-03-14 NOTE — Assessment & Plan Note (Signed)
BP controlled today on hctz and losartan. Will check BMP today.

## 2022-03-14 NOTE — Assessment & Plan Note (Signed)
Patient presents today for follow up. She completed 5 days of antibiotics but was unable to tolerate soaking the finger. Swelling and pain have worsened since last OV. On exam her left fourth finger is warm, erythematous, and painful. Small abscess extending to the lateral nail fold. Given worsening sx and abscess recommended I&D. Discussed risk and benefits of the procedure. She would like to have this done today.   Incison & Drainage Procedure Note Procedure - Incision and Drainage Resident - Janett Billow  Attending - Lisabeth Devoid  Patient positioned. 23 gauge syringe used to  create an opening between the nail and nail bed. Nickle sized amount of purulent and blood material expressed from the wound.   Wound cleaned with sterile 2x2 guaze and dressed with a bandage.  Patient with improvement of pain after procedure. Infection will likely resolve with I&d. She was instructed to call if she has worsening pain, redness, or swelling and would prescribe a 3 day course of antibiotics.

## 2022-03-14 NOTE — Progress Notes (Signed)
Established Patient Office Visit  Subjective   Patient ID: Danielle Mason, female    DOB: May 09, 1960  Age: 61 y.o. MRN: [id number]  No chief complaint on file.   Danielle Mason is a 61 y.o. person living with a history listed below who presents to clinic with husband for follow up of paronychia of the left fourth finger. Please refer to problem based charting for further details and assessment and plan of current problem and chronic medical conditions.     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   Breast pain, left 12/22/2020   Murmur 03/16/2020   Hemorrhoid 12/29/2019   History of recurrent UTIs 12/08/2019   Nuclear sclerosis of both eyes 06/29/2019   Localized osteoarthritis of knees, bilateral 03/18/2019   Constipation 11/28/2015   Chronic venous insufficiency 09/08/2013   Obesity, Class III, BMI 40-49.9 (morbid obesity) ([place]) 07/14/2013   Diabetes ([place]) 07/14/2013   Localized osteoarthrosis, shoulder region 09/26/2009   GERD 04/28/2009   Hyperlipidemia 11/05/2008   Irritable bowel syndrome 02/03/2008   Essential hypertension 06/10/2006      Review of Systems  Constitutional:  Negative for chills and fever.  Musculoskeletal:        Left finger pain  Skin:        Swelling of left finger  All other systems reviewed and are negative.     Objective:     BP (!) 111/58 (BP Location: Right Arm, Patient Position: Sitting, Cuff Size: Large)   Pulse 81   Temp 97.7 F (36.5 C) (Oral)   Resp (!) 32   Ht _0  (1.549 m)   Wt 213 lb 8 oz (96.8 kg)   SpO2 100% Comment: room air  BMI 40.34 kg/m  BP Readings from Last 3 Encounters:  03/13/22 (!) 111/58  03/06/22 99/60  01/30/22 (!) 137/93      Physical Exam Constitutional:      Appearance: Normal appearance.  HENT:     Mouth/Throat:     Mouth: Mucous membranes are moist.     Pharynx: Oropharynx is clear.   Cardiovascular:     Rate and Rhythm: Normal rate and regular rhythm.  Pulmonary:     Effort: Pulmonary effort is normal.     Breath sounds: No rhonchi or rales.  Abdominal:     General: Abdomen is flat. Bowel sounds are normal. There is no distension.     Palpations: Abdomen is soft.     Tenderness: There is no abdominal tenderness.  Musculoskeletal:        General: Normal range of motion.     Right lower leg: No edema.     Left lower leg: No edema.  Skin:    Capillary Refill: Capillary refill takes less than 2 seconds.     Comments: Induration and fluctuance of the left fourth paronychium with er erythema and warmth   Neurological:     General: No focal deficit present.     Mental Status: She is alert and oriented to person, place, and time.  Psychiatric:        Mood and Affect: Mood normal.        Behavior: Behavior normal.        No results found for any visits on 03/13/22.  Last metabolic panel Lab Results  Component Value Date   GLUCOSE WILL FOLLOW 03/13/2022   NA 139 03/13/2022   K  WILL FOLLOW 03/13/2022   CL 103 03/13/2022   CO2 WILL FOLLOW 03/13/2022   BUN WILL FOLLOW 03/13/2022   CREATININE WILL FOLLOW 03/13/2022   EGFR WILL FOLLOW 03/13/2022   CALCIUM WILL FOLLOW 03/13/2022   PROT 7.2 12/19/2020   ALBUMIN 3.9 12/19/2020   BILITOT 1.0 12/19/2020   ALKPHOS 70 12/19/2020   AST 19 12/19/2020   ALT 20 12/19/2020   ANIONGAP 11 12/19/2020      The 10-year ASCVD risk score (Arnett DK, et al., 2019) is: 12%    Assessment & Plan:   Problem List Items Addressed This Visit       Cardiovascular and Mediastinum   Essential hypertension (Chronic)    BP controlled today on hctz and losartan. Will check BMP today.      Relevant Orders   BMP8+Anion Gap     Endocrine   Diabetes ([place]) - Primary     Musculoskeletal and Integument   Paronychia of finger of left hand    Patient presents today for follow up. She completed 5 days of antibiotics but was  unable to tolerate soaking the finger. Swelling and pain have worsened since last OV. On exam her left fourth finger is warm, erythematous, and painful. Small abscess extending to the lateral nail fold. Given worsening sx and abscess recommended I&D. Discussed risk and benefits of the procedure. She would like to have this done today.   Incison & Drainage Procedure Note Procedure - Incision and Drainage Resident - Janett Billow  Attending - Lisabeth Devoid  Patient positioned. 23 gauge syringe used to  create an opening between the nail and nail bed. Nickle sized amount of purulent and blood material expressed from the wound.   Wound cleaned with sterile 2x2 guaze and dressed with a bandage.  Patient with improvement of pain after procedure. Infection will likely resolve with I&d. She was instructed to call if she has worsening pain, redness, or swelling and would prescribe a 3 day course of antibiotics.        Return in about 1 month (around 04/12/2022).    Danielle Beard, MD

## 2022-03-19 NOTE — Progress Notes (Signed)
Internal Medicine Clinic Attending  I saw and evaluated the patient.  I personally confirmed the key portions of the history and exam documented by the resident  and I reviewed pertinent patient test results.  The assessment, diagnosis, and plan were formulated together and I agree with the documentation in the resident's note.  

## 2022-03-27 ENCOUNTER — Telehealth: Payer: Self-pay | Admitting: Internal Medicine

## 2022-03-27 NOTE — Telephone Encounter (Signed)
Discussed with pt that she is ok she just needs to follow the instructions from this point forward until the procedure.

## 2022-03-27 NOTE — Telephone Encounter (Signed)
Inbound call from patient stating that she is scheduled to have a procedure with Dr. Hilarie Fredrickson on 12/14 at [facility] and she forgot she was not supposed to have salad. Patient is requesting a call back to discuss if she can still have procedure. Please advise.

## 2022-03-28 ENCOUNTER — Other Ambulatory Visit (HOSPITAL_COMMUNITY): Payer: Self-pay

## 2022-03-28 NOTE — Anesthesia Preprocedure Evaluation (Signed)
Anesthesia Evaluation  Patient identified by MRN, date of birth, ID band Patient awake    Reviewed: Allergy & Precautions, Patient's Chart, lab work & pertinent test results  Airway Mallampati: II       Dental   Pulmonary neg pulmonary ROS   breath sounds clear to auscultation       Cardiovascular hypertension,  Rhythm:Regular Rate:Normal     Neuro/Psych  Headaches  negative psych ROS   GI/Hepatic Neg liver ROS,GERD  ,,  Endo/Other  diabetes  Morbid obesity (BMI 40.4)  Renal/GU negative Renal ROSLab Results      Component                Value               Date                      CREATININE               0.76                03/13/2022                BUN                      17                  03/13/2022                NA                       139                 03/13/2022                K                        3.9                 03/13/2022                CL                       103                 03/13/2022                CO2                      20                  03/13/2022             negative genitourinary   Musculoskeletal  (+) Arthritis ,    Abdominal  (+) + obese  Peds  Hematology   Anesthesia Other Findings ALL PCN [facility]tex  Reproductive/Obstetrics                             Anesthesia Physical Anesthesia Plan  ASA: 3  Anesthesia Plan: MAC   Post-op Pain Management: Minimal or no pain anticipated   Induction: Intravenous  PONV Risk Score and Plan: Treatment may vary due to age or medical condition and Propofol infusion  Airway Management Planned: Nasal Cannula and Natural Airway  Additional Equipment: None  Intra-op Plan:  Post-operative Plan:   Informed Consent:      Dental advisory given  Plan Discussed with: CRNA and Anesthesiologist  Anesthesia Plan Comments:         Anesthesia Quick Evaluation

## 2022-03-29 ENCOUNTER — Other Ambulatory Visit: Payer: Self-pay

## 2022-03-29 ENCOUNTER — Ambulatory Visit (HOSPITAL_COMMUNITY)
Admission: RE | Admit: 2022-03-29 | Discharge: 2022-03-29 | Disposition: A | Discharge status: Home / Self Care | Payer: Commercial Managed Care - HMO | Attending: Internal Medicine | Admitting: Internal Medicine

## 2022-03-29 ENCOUNTER — Ambulatory Visit (HOSPITAL_COMMUNITY): Payer: Commercial Managed Care - HMO | Admitting: Anesthesiology

## 2022-03-29 ENCOUNTER — Encounter (HOSPITAL_COMMUNITY)
Admission: RE | Disposition: A | Discharge status: Home / Self Care | Payer: Self-pay | Source: Home / Self Care | Attending: Internal Medicine

## 2022-03-29 ENCOUNTER — Ambulatory Visit (HOSPITAL_BASED_OUTPATIENT_CLINIC_OR_DEPARTMENT_OTHER): Payer: Commercial Managed Care - HMO | Admitting: Anesthesiology

## 2022-03-29 ENCOUNTER — Encounter (HOSPITAL_COMMUNITY): Payer: Self-pay | Admitting: Internal Medicine

## 2022-03-29 DIAGNOSIS — E119 Type 2 diabetes mellitus without complications: Secondary | ICD-10-CM | POA: Insufficient documentation

## 2022-03-29 DIAGNOSIS — E785 Hyperlipidemia, unspecified: Secondary | ICD-10-CM | POA: Insufficient documentation

## 2022-03-29 DIAGNOSIS — M1990 Unspecified osteoarthritis, unspecified site: Secondary | ICD-10-CM | POA: Insufficient documentation

## 2022-03-29 DIAGNOSIS — K219 Gastro-esophageal reflux disease without esophagitis: Secondary | ICD-10-CM | POA: Insufficient documentation

## 2022-03-29 DIAGNOSIS — Z1212 Encounter for screening for malignant neoplasm of rectum: Secondary | ICD-10-CM | POA: Diagnosis not present

## 2022-03-29 DIAGNOSIS — Z1211 Encounter for screening for malignant neoplasm of colon: Secondary | ICD-10-CM | POA: Diagnosis present

## 2022-03-29 DIAGNOSIS — K589 Irritable bowel syndrome without diarrhea: Secondary | ICD-10-CM | POA: Insufficient documentation

## 2022-03-29 DIAGNOSIS — I10 Essential (primary) hypertension: Secondary | ICD-10-CM | POA: Diagnosis not present

## 2022-03-29 DIAGNOSIS — Z6841 Body Mass Index (BMI) 40.0 and over, adult: Secondary | ICD-10-CM | POA: Insufficient documentation

## 2022-03-29 DIAGNOSIS — Z79899 Other long term (current) drug therapy: Secondary | ICD-10-CM | POA: Diagnosis not present

## 2022-03-29 HISTORY — PX: COLONOSCOPY WITH PROPOFOL: SHX5780

## 2022-03-29 LAB — GLUCOSE, CAPILLARY: Glucose-Capillary: 105 mg/dL — ABNORMAL HIGH (ref 70–99)

## 2022-03-29 SURGERY — COLONOSCOPY WITH PROPOFOL
Anesthesia: Monitor Anesthesia Care

## 2022-03-29 MED ORDER — LIDOCAINE 2% (20 MG/ML) 5 ML SYRINGE
INTRAMUSCULAR | Status: DC | PRN
  Administered 2022-03-29: 80 mg via INTRAVENOUS

## 2022-03-29 MED ORDER — LACTATED RINGERS IV SOLN
INTRAVENOUS | Status: AC | PRN
  Administered 2022-03-29: 10 mL/h via INTRAVENOUS

## 2022-03-29 MED ORDER — GLYCOPYRROLATE 0.2 MG/ML IJ SOLN
INTRAMUSCULAR | Status: DC | PRN
  Administered 2022-03-29: .2 mg via INTRAVENOUS

## 2022-03-29 MED ORDER — PROPOFOL 500 MG/50ML IV EMUL
INTRAVENOUS | Status: DC | PRN
  Administered 2022-03-29: 200 ug/kg/min via INTRAVENOUS

## 2022-03-29 MED ORDER — SODIUM CHLORIDE 0.9 % IV SOLN: INTRAVENOUS | Status: DC

## 2022-03-29 MED ORDER — PROPOFOL 10 MG/ML IV BOLUS
INTRAVENOUS | Status: DC | PRN
  Administered 2022-03-29: 100 mg via INTRAVENOUS

## 2022-03-29 SURGICAL SUPPLY — 22 items

## 2022-03-29 NOTE — Transfer of Care (Signed)
Immediate Anesthesia Transfer of Care Note  Patient: Danielle Mason  Procedure(s) Performed: COLONOSCOPY WITH PROPOFOL  Patient Location: PACU  Anesthesia Type:MAC  Level of Consciousness: awake, alert , and oriented  Airway & Oxygen Therapy: Patient Spontanous Breathing and Patient connected to nasal cannula oxygen  Post-op Assessment: Report given to RN and Post -op Vital signs reviewed and stable  Post vital signs: Reviewed and stable  Last Vitals:  Vitals Value Taken Time  BP 109/76 03/29/22 0907  Temp    Pulse 94 03/29/22 0909  Resp 27 03/29/22 0909  SpO2 99 % 03/29/22 0909  Vitals shown include unvalidated device data.  Last Pain:  Vitals:   03/29/22 0756  TempSrc: Temporal  PainSc: 0-No pain         Complications: No notable events documented.

## 2022-03-29 NOTE — Op Note (Signed)
[facility] Patient Name: Danielle Mason Procedure Date : 03/29/2022 MRN: [id number] Attending MD: Jerene Bears , MD, [id number] Date of Birth: [birth year] CSN: [id number] Age: 61 Admit Type: Outpatient Procedure:                Colonoscopy Indications:              Screening for colorectal malignant neoplasm, Last                            colonoscopy: 2012 Providers:                Lajuan Lines. Hilarie Fredrickson, MD, Dulcy Fanny, Janee Morn, Technician Referring MD:             Iona Beard Medicines:                Monitored Anesthesia Care Complications:            No immediate complications. Estimated Blood Loss:     Estimated blood loss: none. Procedure:                Pre-Anesthesia Assessment:                           - Prior to the procedure, a History and Physical                            was performed, and patient medications and                            allergies were reviewed. The patient's tolerance of                            previous anesthesia was also reviewed. The risks                            and benefits of the procedure and the sedation                            options and risks were discussed with the patient.                            All questions were answered, and informed consent                            was obtained. Prior Anticoagulants: The patient has                            taken no anticoagulant or antiplatelet agents. ASA                            Grade Assessment: III - A patient with severe  systemic disease. After reviewing the risks and                            benefits, the patient was deemed in satisfactory                            condition to undergo the procedure.                           After obtaining informed consent, the colonoscope                            was passed under direct vision. Throughout the                            procedure, the  patient's blood pressure, pulse, and                            oxygen saturations were monitored continuously. The                            CF-HQ190L ([id number]) Olympus colonoscope was                            introduced through the anus and advanced to the                            cecum, identified by appendiceal orifice and                            ileocecal valve. The colonoscopy was performed                            without difficulty. The patient tolerated the                            procedure well. The quality of the bowel                            preparation was excellent. The ileocecal valve,                            appendiceal orifice, and rectum were photographed. Scope In: 8:49:19 AM Scope Out: 9:02:53 AM Scope Withdrawal Time: 0 hours 10 minutes 30 seconds  Total Procedure Duration: 0 hours 13 minutes 34 seconds  Findings:      The digital rectal exam was normal.      The entire examined colon appeared normal on direct and retroflexion       views. Impression:               - The entire examined colon is normal on direct and                            retroflexion views.                           -  No specimens collected. Moderate Sedation:      N/A Recommendation:           - Patient has a contact number available for                            emergencies. The signs and symptoms of potential                            delayed complications were discussed with the                            patient. Return to normal activities tomorrow.                            Written discharge instructions were provided to the                            patient.                           - Resume previous diet.                           - Continue present medications.                           - Repeat colonoscopy in 10 years for screening                            purposes. Procedure Code(s):        --- Professional ---                           V7482,  Colorectal cancer screening; colonoscopy on                            individual not meeting criteria for high risk Diagnosis Code(s):        --- Professional ---                           Z12.11, Encounter for screening for malignant                            neoplasm of colon CPT copyright 2022 [employer]. All rights reserved. The codes documented in this report are preliminary and upon coder review may  be revised to meet current compliance requirements. Jerene Bears, MD 03/29/2022 9:11:51 AM This report has been signed electronically. Number of Addenda: 0

## 2022-03-29 NOTE — Anesthesia Postprocedure Evaluation (Signed)
Anesthesia Post Note  Patient: Danielle Mason  Procedure(s) Performed: COLONOSCOPY WITH PROPOFOL     Patient location during evaluation: Endoscopy Anesthesia Type: MAC Level of consciousness: awake Pain management: pain level controlled Vital Signs Assessment: post-procedure vital signs reviewed and stable Respiratory status: spontaneous breathing Cardiovascular status: stable Postop Assessment: no apparent nausea or vomiting Anesthetic complications: no   No notable events documented.  Last Vitals:  Vitals:   03/29/22 0915 03/29/22 0920  BP: 126/85   Pulse: 91 85  Resp: (!) 23 (!) 23  Temp:    SpO2: 98% 98%    Last Pain:  Vitals:   03/29/22 0915  TempSrc:   PainSc: 0-No pain                 Mieshia Pepitone
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
## 2022-04-01 ENCOUNTER — Encounter (HOSPITAL_COMMUNITY): Payer: Self-pay | Admitting: Internal Medicine

## 2022-04-12 ENCOUNTER — Telehealth: Payer: Self-pay | Admitting: *Deleted

## 2022-04-12 ENCOUNTER — Encounter: Payer: Self-pay | Admitting: Student in an Organized Health Care Education/Training Program

## 2022-04-12 ENCOUNTER — Ambulatory Visit (INDEPENDENT_AMBULATORY_CARE_PROVIDER_SITE_OTHER): Payer: Commercial Managed Care - HMO | Admitting: Student in an Organized Health Care Education/Training Program

## 2022-04-12 ENCOUNTER — Other Ambulatory Visit (HOSPITAL_COMMUNITY): Payer: Self-pay

## 2022-04-12 DIAGNOSIS — R052 Subacute cough: Secondary | ICD-10-CM | POA: Diagnosis not present

## 2022-04-12 MED ORDER — GUAIFENESIN-CODEINE 100-10 MG/5ML PO SOLN
5.0000 mL | Freq: Every evening | ORAL | 0 refills | Status: DC | PRN
  Filled 2022-04-12: qty 120, 24d supply, fill #0

## 2022-04-12 MED ORDER — BENZONATATE 100 MG PO CAPS
100.0000 mg | ORAL_CAPSULE | Freq: Three times a day (TID) | ORAL | 0 refills | Status: DC | PRN
  Filled 2022-04-12: qty 30, 10d supply, fill #0

## 2022-04-12 MED ORDER — FLUTICASONE PROPIONATE 50 MCG/ACT NA SUSP
1.0000 | Freq: Every day | NASAL | 2 refills | Status: DC
  Filled 2022-04-12: qty 16, 60d supply, fill #0

## 2022-04-12 NOTE — Assessment & Plan Note (Signed)
Subacute cough for about 2 months now, with waxing and waning symptoms. No symptoms right now to suggest pneumonia. Sounds more like it is due to either post-infectious reactive airway cough or upper airway cough syndrome. No history of asthma or structural lung disease. She is doing well at home. Will plan for more supportive care for now. I prescribed tessalon to use as needed during the daytime, and guaifenesin-codeine syrup to help with night time cough. I gave precautions about codeine and sedation, falls. Patient does not use alcohol. We talked about sinus irrigation, and I prescribed flonase to use twice daily. I recommended return to clinic if cough is not better in 2-4 more weeks, would do a chest xray to start work up if this becomes a chronic cough.

## 2022-04-12 NOTE — Telephone Encounter (Signed)
We can offer the person a telephone visit with me sometime in the next hour if she would like. Otherwise, can offer her next available in person visit.

## 2022-04-12 NOTE — Telephone Encounter (Signed)
Call from patient continues to have a cough.  Yellowish mucous.  Thick at times  No fevers.  Taking Mucinex and Robitussin DM.   Wants to know what else she can do.
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
## 2022-05-01 ENCOUNTER — Other Ambulatory Visit: Payer: Self-pay | Admitting: Internal Medicine

## 2022-05-01 ENCOUNTER — Other Ambulatory Visit (HOSPITAL_COMMUNITY): Payer: Self-pay

## 2022-05-01 ENCOUNTER — Other Ambulatory Visit: Payer: Self-pay

## 2022-05-02 ENCOUNTER — Other Ambulatory Visit: Payer: Self-pay | Admitting: Student

## 2022-05-02 ENCOUNTER — Telehealth: Payer: Self-pay

## 2022-05-02 ENCOUNTER — Other Ambulatory Visit (HOSPITAL_COMMUNITY): Payer: Self-pay

## 2022-05-02 MED ORDER — LOSARTAN POTASSIUM 50 MG PO TABS
50.0000 mg | ORAL_TABLET | Freq: Every day | ORAL | 3 refills | Status: DC
  Filled 2022-05-02: qty 90, 90d supply, fill #0
  Filled 2022-07-20: qty 30, 30d supply, fill #1
  Filled 2022-08-29: qty 30, 30d supply, fill #2
  Filled 2022-10-03 (×2): qty 30, 30d supply, fill #3
  Filled 2022-11-02: qty 30, 30d supply, fill #4
  Filled 2022-11-19: qty 30, 30d supply, fill #5
  Filled 2023-03-08: qty 30, 30d supply, fill #0
  Filled 2023-04-03: qty 30, 30d supply, fill #1

## 2022-05-02 NOTE — Telephone Encounter (Signed)
Called/informed pt to continue Losartan per Dr Youlanda Mighty office note 03/13/22 and med list. And she needs a refill - sent to her PCP. She wants to know when her doctor will be stopping this med - informed pt to discuss this issue at her next OV.

## 2022-05-02 NOTE — Telephone Encounter (Signed)
Pt would like to know if she need to continue to take losartan (COZAAR) 50 MG tablet, please call pt back.

## 2022-05-02 NOTE — Telephone Encounter (Signed)
Agree she should continue losartan. I have sent her refills.

## 2022-05-30 ENCOUNTER — Other Ambulatory Visit (HOSPITAL_COMMUNITY): Payer: Self-pay

## 2022-06-01 ENCOUNTER — Other Ambulatory Visit: Payer: Self-pay

## 2022-06-01 ENCOUNTER — Ambulatory Visit (INDEPENDENT_AMBULATORY_CARE_PROVIDER_SITE_OTHER): Payer: 59 | Admitting: Internal Medicine

## 2022-06-01 ENCOUNTER — Encounter: Payer: Self-pay | Admitting: Internal Medicine

## 2022-06-01 ENCOUNTER — Other Ambulatory Visit (HOSPITAL_COMMUNITY): Payer: Self-pay

## 2022-06-01 VITALS — BP 122/74 | HR 88 | Temp 97.5°F | Resp 28 | Ht 61.0 in | Wt 215.9 lb

## 2022-06-01 DIAGNOSIS — K219 Gastro-esophageal reflux disease without esophagitis: Secondary | ICD-10-CM

## 2022-06-01 DIAGNOSIS — E119 Type 2 diabetes mellitus without complications: Secondary | ICD-10-CM

## 2022-06-01 DIAGNOSIS — R059 Cough, unspecified: Secondary | ICD-10-CM | POA: Diagnosis not present

## 2022-06-01 DIAGNOSIS — E785 Hyperlipidemia, unspecified: Secondary | ICD-10-CM

## 2022-06-01 DIAGNOSIS — Z0000 Encounter for general adult medical examination without abnormal findings: Secondary | ICD-10-CM

## 2022-06-01 DIAGNOSIS — I10 Essential (primary) hypertension: Secondary | ICD-10-CM

## 2022-06-01 MED ORDER — SALINE SPRAY 0.65 % NA SOLN
2.0000 | NASAL | 1 refills | Status: DC | PRN
  Filled 2022-06-01: qty 88, fill #0

## 2022-06-01 MED ORDER — PANTOPRAZOLE SODIUM 40 MG PO TBEC
40.0000 mg | DELAYED_RELEASE_TABLET | Freq: Every day | ORAL | 0 refills | Status: DC
  Filled 2022-06-01: qty 30, 30d supply, fill #0

## 2022-06-01 NOTE — Patient Instructions (Signed)
[name], it was a pleasure seeing you today! You endorsed feeling well today. Below are some of the things we talked about this visit. We look forward to seeing you in the follow up appointment!  Today we discussed: For your throat irritation and acid reflux symptoms, we will start a medication call pantoprazole. Please take this daily.  We will check lab work today.   I have ordered the following labs today:   Lab Orders         Hemoglobin A1c         BMP8+Anion Gap         Lipid Profile       Referrals ordered today:   Referral Orders  No referral(s) requested today     I have ordered the following medication/changed the following medications:   Stop the following medications: Medications Discontinued During This Encounter  Medication Reason   guaiFENesin-codeine 100-10 MG/5ML syrup Patient has not taken in last 30 days   doxycycline (VIBRA-TABS) 100 MG tablet Patient has not taken in last 30 days   benzonatate (TESSALON PERLES) 100 MG capsule Completed Course     Start the following medications: Meds ordered this encounter  Medications   pantoprazole (PROTONIX) 40 MG tablet    Sig: Take 1 tablet (40 mg total) by mouth daily.    Dispense:  48 tablet    Refill:  0     Follow-up: 6 week follow up  Please make sure to arrive 15 minutes prior to your next appointment. If you arrive late, you may be asked to reschedule.   We look forward to seeing you next time. Please call our clinic at [phone] if you have any questions or concerns. The best time to call is Monday-Friday from 9am-4pm, but there is someone available 24/7. If after hours or the weekend, call the main hospital number and ask for the Internal Medicine Resident On-Call. If you need medication refills, please notify your pharmacy one week in advance and they will send [place] a request.  Thank you for letting us take part in your care. Wishing you the best!  Thank you, Idamae Schuller, MD
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
## 2022-06-02 LAB — BMP8+ANION GAP
Anion Gap: 14 mmol/L (ref 10.0–18.0)
BUN/Creatinine Ratio: 22 (ref 12–28)
BUN: 18 mg/dL (ref 8–27)
CO2: 23 mmol/L (ref 20–29)
Calcium: 9.8 mg/dL (ref 8.7–10.3)
Chloride: 105 mmol/L (ref 96–106)
Creatinine, Ser: 0.82 mg/dL (ref 0.57–1.00)
Glucose: 139 mg/dL — ABNORMAL HIGH (ref 70–99)
Potassium: 4.4 mmol/L (ref 3.5–5.2)
Sodium: 142 mmol/L (ref 134–144)
eGFR: 80 mL/min/{1.73_m2} (ref >59)

## 2022-06-02 LAB — LIPID PANEL
Chol/HDL Ratio: 3.7 ratio (ref 0.0–4.4)
Cholesterol, Total: 172 mg/dL (ref 100–199)
HDL: 46 mg/dL (ref >39)
LDL Chol Calc (NIH): 110 mg/dL — ABNORMAL HIGH (ref 0–99)
Triglycerides: 85 mg/dL (ref 0–149)
VLDL Cholesterol Cal: 16 mg/dL (ref 5–40)

## 2022-06-02 LAB — HEMOGLOBIN A1C
Est. average glucose Bld gHb Est-mCnc: 143 mg/dL
Hgb A1c MFr Bld: 6.6 % — ABNORMAL HIGH (ref 4.8–5.6)

## 2022-06-03 DIAGNOSIS — Z0000 Encounter for general adult medical examination without abnormal findings: Secondary | ICD-10-CM | POA: Insufficient documentation

## 2022-06-03 NOTE — Assessment & Plan Note (Signed)
Pt has referral to gynecology placed for her ASCUS. She does not need referral to gynecology but repeat pap smear q3 years.

## 2022-06-03 NOTE — Assessment & Plan Note (Signed)
Pt states her cough has resolved. She continues to have throat irritation upon waking up in the morning. She does not have any sinus irritation or symptoms of post-nasal drip. She has hx of acid reflux which could be causing this so will trial PPI. If this resolves will discontinue PPI in 6 weeks. But if her symptoms return at discontinuation of the PPI she may need to be referred to GI for EGD given her age. Will follow up in 6 weeks.

## 2022-06-03 NOTE — Assessment & Plan Note (Signed)
Diet controlled. Repeat A1c 6.6. Will continue to encourage lifestyle changes. Will discuss with the patient option of starting GLP-1 agonist given her Class III obesity.

## 2022-06-03 NOTE — Assessment & Plan Note (Signed)
Pt has HLD and is on lipitor 40 mg. Repeat lipid panel shows is LDL 110 goal is <100. ASCVD Risk is 15.4. Will discuss increasing Lipitor to 80 mg and continuing to work on lifestyle modifications.

## 2022-06-03 NOTE — Assessment & Plan Note (Signed)
Pt has HTN. She is well controlled on HCTZ 25 mg qd, and Losartan 50 mg qd. Repeat BMP with normal renal function.

## 2022-06-04 ENCOUNTER — Other Ambulatory Visit (HOSPITAL_COMMUNITY): Payer: Self-pay

## 2022-06-04 NOTE — Progress Notes (Signed)
Internal Medicine Clinic Attending  Case discussed with Dr. Khan  At the time of the visit.  We reviewed the resident's history and exam and pertinent patient test results.  I agree with the assessment, diagnosis, and plan of care documented in the resident's note.  

## 2022-06-15 ENCOUNTER — Other Ambulatory Visit (HOSPITAL_COMMUNITY): Payer: Self-pay

## 2022-06-29 ENCOUNTER — Ambulatory Visit (INDEPENDENT_AMBULATORY_CARE_PROVIDER_SITE_OTHER): Payer: 59 | Admitting: Student

## 2022-06-29 ENCOUNTER — Other Ambulatory Visit (HOSPITAL_COMMUNITY): Payer: Self-pay

## 2022-06-29 VITALS — BP 125/87 | HR 78 | Temp 98.1°F | Ht 61.0 in | Wt 218.3 lb

## 2022-06-29 DIAGNOSIS — B9689 Other specified bacterial agents as the cause of diseases classified elsewhere: Secondary | ICD-10-CM

## 2022-06-29 DIAGNOSIS — I10 Essential (primary) hypertension: Secondary | ICD-10-CM

## 2022-06-29 DIAGNOSIS — J019 Acute sinusitis, unspecified: Secondary | ICD-10-CM | POA: Diagnosis not present

## 2022-06-29 DIAGNOSIS — R87610 Atypical squamous cells of undetermined significance on cytologic smear of cervix (ASC-US): Secondary | ICD-10-CM | POA: Diagnosis not present

## 2022-06-29 MED ORDER — DOXYCYCLINE HYCLATE 100 MG PO TABS
100.0000 mg | ORAL_TABLET | Freq: Two times a day (BID) | ORAL | 0 refills | Status: AC
  Filled 2022-06-29: qty 10, 5d supply, fill #0

## 2022-06-29 MED ORDER — AMOXICILLIN 875 MG PO TABS
875.0000 mg | ORAL_TABLET | Freq: Two times a day (BID) | ORAL | 0 refills | Status: DC
  Filled 2022-06-29: qty 10, 5d supply, fill #0

## 2022-06-29 NOTE — Progress Notes (Unsigned)
Established Patient Office Visit  Subjective   Patient ID: Danielle Mason, female    DOB: 14-Dec-1960  Age: 62 y.o. MRN: [id number]  Chief Complaint  Patient presents with   Medication Refill   Follow-up    Danielle Mason is a 62 y.o. person living with a history listed below who presents to the clinic for nasal pain and drainage. Please refer to problem based charting for further details and assessment and plan of current problem and chronic medical conditions.    Patient Active Problem List   Diagnosis Date Noted   Acute bacterial rhinosinusitis 07/03/2022   ASCUS of cervix with negative high risk HPV 06/29/2022   Healthcare maintenance 06/03/2022   Paronychia of finger of left hand 03/06/2022   Suppurative parotitis 11/01/2021   Breast pain, left 12/22/2020   Murmur 03/16/2020   Hemorrhoid 12/29/2019   History of recurrent UTIs 12/08/2019   Nuclear sclerosis of both eyes 06/29/2019   Localized osteoarthritis of knees, bilateral 03/18/2019   Cough 03/18/2019   Constipation 11/28/2015   Chronic venous insufficiency 09/08/2013   Obesity, Class III, BMI 40-49.9 (morbid obesity) ([place]) 07/14/2013   Diabetes ([place]) 07/14/2013   Localized osteoarthrosis, shoulder region 09/26/2009   GERD 04/28/2009   Hyperlipidemia 11/05/2008   Irritable bowel syndrome 02/03/2008   Essential hypertension 06/10/2006      Review of Systems  Constitutional:  Negative for chills and fever.  HENT:  Negative for congestion, nosebleeds, sinus pain and sore throat.        Rhinorrhea, nasal pain,   All other systems reviewed and are negative.     Objective:     BP 125/87 (BP Location: Right Arm, Patient Position: Sitting, Cuff Size: Small)   Pulse 78   Temp 98.1 F (36.7 C) (Oral)   Ht 5\' 1"  (1.549 m)   Wt 218 lb 4.8 oz (99 kg)   SpO2 99%   BMI 41.25 kg/m  BP Readings from Last 3 Encounters:  06/29/22 125/87  06/01/22 122/74  03/29/22 (!) 126/93      Physical  Exam Constitutional:      Appearance: Normal appearance.  HENT:     Head: Normocephalic and atraumatic.     Right Ear: Hearing and tympanic membrane normal.     Left Ear: Hearing and tympanic membrane normal.     Nose: Nasal tenderness, mucosal edema, congestion and rhinorrhea present. No nasal deformity or signs of injury. Rhinorrhea is purulent.     Right Nostril: No foreign body.     Left Nostril: No foreign body.     Comments: No sinus tenderness    Mouth/Throat:     Mouth: Mucous membranes are moist.     Pharynx: Oropharynx is clear.  Eyes:     Extraocular Movements: Extraocular movements intact.     Conjunctiva/sclera: Conjunctivae normal.     Pupils: Pupils are equal, round, and reactive to light.  Cardiovascular:     Rate and Rhythm: Normal rate and regular rhythm.     Pulses: Normal pulses.     Heart sounds: No murmur heard. Pulmonary:     Effort: Pulmonary effort is normal.     Breath sounds: No rhonchi or rales.  Abdominal:     General: Abdomen is flat. Bowel sounds are normal. There is no distension.     Palpations: Abdomen is soft.     Tenderness: There is no abdominal tenderness.  Musculoskeletal:        General: Normal range of motion.  Right lower leg: No edema.     Left lower leg: No edema.  Skin:    General: Skin is warm and dry.     Capillary Refill: Capillary refill takes less than 2 seconds.  Neurological:     General: No focal deficit present.     Mental Status: She is alert and oriented to person, place, and time.  Psychiatric:        Mood and Affect: Mood normal.        Behavior: Behavior normal.     No results found for any visits on 06/29/22.    The 10-year ASCVD risk score (Arnett DK, et al., 2019) is: 16.3%    Assessment & Plan:   Problem List Items Addressed This Visit       Cardiovascular and Mediastinum   Essential hypertension (Chronic)    BP well controlled, continue on current medications.        Respiratory   Acute  bacterial rhinosinusitis    Patient present for nasal swelling and drainage for the past month. States she notices scabs falling out of nose while eating. Denies any bleeding from this. This has been ongoing since last visit 1 month ago. Has been using flonase and saline nasal sprays. Stopped the flonase due to burning. Does not feel the saline spray is making any difference. No fever or chills, no respiratory symptoms, no eye or ear involvement. Exam with swollen nasal alar and purulent drainage. Suspect continue symptom due to acute bacterial rhino sinusitis. Will start on 5 day of doxycycline given penicillin allergy. Advised she avoid steroid sprays for now. Follow up if symptoms not improving.       Relevant Medications   doxycycline (VIBRA-TABS) 100 MG tablet   RESOLVED: Sinusitis   Relevant Medications   doxycycline (VIBRA-TABS) 100 MG tablet     Other   ASCUS of cervix with negative high risk HPV - Primary    Return in about 3 months (around 09/29/2022).    Iona Beard, MD

## 2022-06-29 NOTE — Patient Instructions (Addendum)
It was a pleasure seeing you today  Please take doxycyline twice daily for 5 days for nose symptoms   Please call us if this not does not improve  Follow-up as needed for your nasal symptoms Follow-up in 3 to 6 months for a checkup

## 2022-06-30 ENCOUNTER — Other Ambulatory Visit: Payer: Self-pay

## 2022-07-02 ENCOUNTER — Other Ambulatory Visit (HOSPITAL_COMMUNITY): Payer: Self-pay

## 2022-07-03 DIAGNOSIS — B9689 Other specified bacterial agents as the cause of diseases classified elsewhere: Secondary | ICD-10-CM | POA: Insufficient documentation

## 2022-07-03 DIAGNOSIS — J329 Chronic sinusitis, unspecified: Secondary | ICD-10-CM | POA: Insufficient documentation

## 2022-07-03 NOTE — Assessment & Plan Note (Signed)
BP well controlled, continue on current medications.

## 2022-07-03 NOTE — Assessment & Plan Note (Signed)
Patient present for nasal swelling and drainage for the past month. States she notices scabs falling out of nose while eating. Denies any bleeding from this. This has been ongoing since last visit 1 month ago. Has been using flonase and saline nasal sprays. Stopped the flonase due to burning. Does not feel the saline spray is making any difference. No fever or chills, no respiratory symptoms, no eye or ear involvement. Exam with swollen nasal alar and purulent drainage. Suspect continue symptom due to acute bacterial rhino sinusitis. Will start on 5 day of doxycycline given penicillin allergy. Advised she avoid steroid sprays for now. Follow up if symptoms not improving.

## 2022-07-04 NOTE — Progress Notes (Signed)
Internal Medicine Clinic Attending  Case discussed with Dr. Liang  at the time of the visit.  We reviewed the resident's history and exam and pertinent patient test results.  I agree with the assessment, diagnosis, and plan of care documented in the resident's note.  

## 2022-07-20 ENCOUNTER — Other Ambulatory Visit (HOSPITAL_COMMUNITY): Payer: Self-pay

## 2022-08-06 ENCOUNTER — Telehealth: Payer: Self-pay | Admitting: *Deleted

## 2022-08-06 ENCOUNTER — Ambulatory Visit (INDEPENDENT_AMBULATORY_CARE_PROVIDER_SITE_OTHER): Payer: 59 | Admitting: Student

## 2022-08-06 ENCOUNTER — Other Ambulatory Visit: Payer: Self-pay

## 2022-08-06 ENCOUNTER — Encounter: Payer: Self-pay | Admitting: Student

## 2022-08-06 ENCOUNTER — Other Ambulatory Visit (HOSPITAL_COMMUNITY): Payer: Self-pay

## 2022-08-06 VITALS — BP 151/73 | HR 68 | Temp 97.6°F | Ht 62.0 in | Wt 219.1 lb

## 2022-08-06 DIAGNOSIS — B9689 Other specified bacterial agents as the cause of diseases classified elsewhere: Secondary | ICD-10-CM

## 2022-08-06 DIAGNOSIS — J302 Other seasonal allergic rhinitis: Secondary | ICD-10-CM | POA: Diagnosis not present

## 2022-08-06 DIAGNOSIS — I10 Essential (primary) hypertension: Secondary | ICD-10-CM | POA: Diagnosis not present

## 2022-08-06 DIAGNOSIS — J019 Acute sinusitis, unspecified: Secondary | ICD-10-CM | POA: Diagnosis not present

## 2022-08-06 DIAGNOSIS — R42 Dizziness and giddiness: Secondary | ICD-10-CM

## 2022-08-06 DIAGNOSIS — J309 Allergic rhinitis, unspecified: Secondary | ICD-10-CM | POA: Insufficient documentation

## 2022-08-06 MED ORDER — HYDROCHLOROTHIAZIDE 12.5 MG PO TABS
12.5000 mg | ORAL_TABLET | Freq: Every morning | ORAL | 3 refills | Status: DC
  Filled 2022-08-06: qty 30, 30d supply, fill #0
  Filled 2022-08-29: qty 30, 30d supply, fill #1
  Filled 2022-09-28: qty 30, 30d supply, fill #2
  Filled 2022-11-02: qty 30, 30d supply, fill #3
  Filled 2023-03-08: qty 30, 30d supply, fill #0
  Filled 2023-04-03: qty 30, 30d supply, fill #1

## 2022-08-06 MED ORDER — SALINE SPRAY 0.65 % NA SOLN
2.0000 | NASAL | 1 refills | Status: DC | PRN
  Filled 2022-08-06: qty 88, fill #0

## 2022-08-06 MED ORDER — CETIRIZINE HCL 10 MG PO TABS
10.0000 mg | ORAL_TABLET | Freq: Every day | ORAL | 2 refills | Status: DC
  Filled 2022-08-06: qty 30, 30d supply, fill #0

## 2022-08-06 NOTE — Assessment & Plan Note (Addendum)
Patient with "wooziness" in the mornings around 7:30 AM.  This lasts 2 to 3 minutes before resolving.  She is generally driving when this happens.  This is not provoked by head movements.  She has no focal neurologic complaints.  Does not occur at other times during the day.  She is concerned this may be due to to her blood pressure medications.  She takes hydrochlorothiazide when she wakes up around 3 30-4 a.m and losartan 50 mg at 730 in the evenings.  She generally has a boiled egg for breakfast and does not eat much prior to going to work.  She denies any loss of consciousness or falls related to her symptoms.  Orthostatics vitals obtained in office, 140/80 laying, 132/71 sitting, 132/81 standing, and 134/70 standing after 3 minutes. Borderline positive although she was asymptomatic during testing.  Otherwise vitals are stable.  Neurological examination was unremarkable.  Suspect her symptoms are related to dehydration and possibly a component of orthostatic hypotension in the setting of taking thiazide diuretic.  Lower concern for stroke or BPPV given history and reassuring neurologic exam.  -Decrease hydrochlorothiazide to 12.5 mg daily -Encourage p.o. hydration and intake in the mornings -Patient given return precautions, follow-up in 1 month for BP recheck and to monitor her symptoms

## 2022-08-06 NOTE — Assessment & Plan Note (Addendum)
BP initially elevated but improved to 140/80 on repeat. Patient endorsing a few minutes of dizziness in the morning with borderline positive orthostatics see dizziness for further details. Will decreased HCTZ due to this. May need additional BP control in future. Can go up on losartan vs adding CCB. - Decrease HCTZ to 12.5 mg daily - Continue losartan 50 mg daily -Follow up BP in 1 month

## 2022-08-06 NOTE — Assessment & Plan Note (Signed)
Symptoms resolved with antibiotics. No further purulence noted on exam. Increase nasal drainage and irration for the past week which is likely due to allergic rhinitis, see for further details.

## 2022-08-06 NOTE — Assessment & Plan Note (Signed)
Reports nose pain improved after antibiotics for acute bacterial rhinosinusitis but has increased in the last 2 days with increased nasal drainage.  Additionally has crusting in the mornings around her nose and associated nose pain..  Notes occasional blood when blowing her nose.  Does have a history of seasonal allergies.  She has been using sodium chloride nasal spray.  Flonase previously caused burning.  Edematous nasal turbinates with irritation of the nasal mucosa. No signs of infection.  Suspect symptoms related to allergic rhinitis.  She is not currently taking any antihistamines.  Recommend starting Zyrtec 10 mg daily sinus irrigation.  Follow-up in 1 month.

## 2022-08-06 NOTE — Progress Notes (Signed)
Established Patient Office Visit  Subjective   Patient ID: Danielle Mason, female    DOB: February 21, 1961  Age: 62 y.o. MRN: [id number]  Chief Complaint  Patient presents with   Follow-up   Hypertension    Danielle Mason is a 62 y.o. person living with a history listed below who presents to clinic for wooziness in the morning a few hours after taking her morning BP medications. Please refer to problem based charting for further details and assessment and plan of current problem and chronic medical conditions.    Patient Active Problem List   Diagnosis Date Noted   Allergic rhinitis 08/06/2022   Dizziness 08/06/2022   Acute bacterial rhinosinusitis 07/03/2022   ASCUS of cervix with negative high risk HPV 06/29/2022   Healthcare maintenance 06/03/2022   Paronychia of finger of left hand 03/06/2022   Suppurative parotitis 11/01/2021   Murmur 03/16/2020   Hemorrhoid 12/29/2019   Nuclear sclerosis of both eyes 06/29/2019   Localized osteoarthritis of knees, bilateral 03/18/2019   Cough 03/18/2019   Constipation 11/28/2015   Chronic venous insufficiency 09/08/2013   Obesity, Class III, BMI 40-49.9 (morbid obesity) (HCC) 07/14/2013   Diabetes 07/14/2013   Localized osteoarthrosis, shoulder region 09/26/2009   GERD 04/28/2009   Hyperlipidemia 11/05/2008   Irritable bowel syndrome 02/03/2008   Essential hypertension 06/10/2006      Review of Systems  Constitutional:  Negative for chills and fever.  HENT:  Positive for congestion. Negative for nosebleeds, sinus pain and sore throat.   Respiratory:  Negative for cough and shortness of breath.   Cardiovascular:  Negative for chest pain and palpitations.  Musculoskeletal:  Negative for falls.  Neurological:  Positive for dizziness. Negative for focal weakness, loss of consciousness, weakness and headaches.  All other systems reviewed and are negative.     Objective:     BP (!) 151/73 (BP Location: Right Arm, Patient  Position: Sitting, Cuff Size: Large)   Pulse 68   Temp 97.6 F (36.4 C) (Oral)   Ht  (1.575 m)   Wt 219 lb 1.6 oz (99.4 kg)   SpO2 100%   BMI 40.07 kg/m  BP Readings from Last 3 Encounters:  08/06/22 (!) 151/73  06/29/22 125/87  06/01/22 122/74      Physical Exam Constitutional:      Appearance: Normal appearance. She is obese.  HENT:     Head: Normocephalic and atraumatic.     Nose: Nasal tenderness, mucosal edema, congestion and rhinorrhea present.     Right Turbinates: Enlarged and swollen.     Left Turbinates: Enlarged and swollen.     Comments: Erythematous nasal mucosa.  No sinus tenderness    Mouth/Throat:     Mouth: Mucous membranes are moist.     Pharynx: Oropharynx is clear.  Eyes:     Extraocular Movements: Extraocular movements intact.     Conjunctiva/sclera: Conjunctivae normal.     Pupils: Pupils are equal, round, and reactive to light.  Cardiovascular:     Rate and Rhythm: Normal rate and regular rhythm.     Pulses: Normal pulses.     Heart sounds: No murmur heard. Pulmonary:     Effort: Pulmonary effort is normal.     Breath sounds: No rhonchi or rales.  Abdominal:     General: Abdomen is flat. Bowel sounds are normal. There is no distension.     Palpations: Abdomen is soft.     Tenderness: There is no abdominal tenderness.  Musculoskeletal:  General: Normal range of motion.     Right lower leg: No edema.     Left lower leg: No edema.  Skin:    General: Skin is warm and dry.     Capillary Refill: Capillary refill takes less than 2 seconds.  Neurological:     General: No focal deficit present.     Mental Status: She is alert and oriented to person, place, and time. Mental status is at baseline.     Cranial Nerves: No cranial nerve deficit.     Sensory: No sensory deficit.     Motor: No weakness.     Coordination: Coordination normal.  Psychiatric:        Mood and Affect: Mood normal.        Behavior: Behavior normal.     The  10-year ASCVD risk score (Arnett DK, et al., 2019) is: 25.4%    Assessment & Plan:   Problem List Items Addressed This Visit       Cardiovascular and Mediastinum   Essential hypertension (Chronic)    BP initially elevated but improved to 140/80 on repeat. Patient endorsing a few minutes of dizziness in the morning with borderline positive orthostatics see dizziness for further details. Will decreased HCTZ due to this. May need additional BP control in future. Can go up on losartan vs adding CCB. - Decrease HCTZ to 12.5 mg daily - Continue losartan 50 mg daily -Follow up BP in 1 month      Relevant Medications   hydrochlorothiazide (HYDRODIURIL) 12.5 MG tablet     Respiratory   Acute bacterial rhinosinusitis - Primary    Symptoms resolved with antibiotics. No further purulence noted on exam. Increase nasal drainage and irration for the past week which is likely due to allergic rhinitis, see for further details.       Relevant Medications   cetirizine (ZYRTEC ALLERGY) 10 MG tablet   sodium chloride (OCEAN) 0.65 % SOLN nasal spray   Allergic rhinitis    Reports nose pain improved after antibiotics for acute bacterial rhinosinusitis but has increased in the last 2 days with increased nasal drainage.  Additionally has crusting in the mornings around her nose and associated nose pain..  Notes occasional blood when blowing her nose.  Does have a history of seasonal allergies.  She has been using sodium chloride nasal spray.  Flonase previously caused burning.  Edematous nasal turbinates with irritation of the nasal mucosa. No signs of infection.  Suspect symptoms related to allergic rhinitis.  She is not currently taking any antihistamines.  Recommend starting Zyrtec 10 mg daily sinus irrigation.  Follow-up in 1 month.        Other   Dizziness    Patient with "wooziness" in the mornings around 7:30 AM.  This lasts 2 to 3 minutes before resolving.  She is generally driving when this  happens.  This is not provoked by head movements.  She has no focal neurologic complaints.  Does not occur at other times during the day.  She is concerned this may be due to to her blood pressure medications.  She takes hydrochlorothiazide when she wakes up around 3 30-4 a.m and losartan 50 mg at 730 in the evenings.  She generally has a boiled egg for breakfast and does not eat much prior to going to work.  She denies any loss of consciousness or falls related to her symptoms.  Orthostatics in office borderline positive although she was asymptomatic during testing.  Otherwise vitals  are stable.  Neurological examination was unremarkable.  Suspect her symptoms are related to dehydration and possibly a component of orthostatic hypotension in the setting of taking thiazide diuretic.  Lower concern for stroke or BPPV given history and reassuring neurologic exam.  -Decrease hydrochlorothiazide to 12.5 mg daily -Encourage p.o. hydration and intake in the mornings -Patient given return precautions, follow-up in 1 month for BP recheck and to monitor her symptoms       Return in about 4 weeks (around 09/03/2022).    Quincy Simmonds, MD

## 2022-08-06 NOTE — Telephone Encounter (Signed)
Patient called in stating she feels "woozy" ~ 2-3 hours after taking HCTZ. Takes losartan in PM          ~ 7:30. Takes HCTZ in AM ~ 4:30. By 7:40 AM she starts feeling woozy. States she's been on HCTZ since 2017. Began taking losartan in 01/2022. States at that time she was eating Oodles of Noodles daily. She no longer eats this and feels she no longer needs losartan. Appt given this AM to be evaluated.

## 2022-08-06 NOTE — Patient Instructions (Signed)
It was a pleasure seeing you in clinic today.  I believe your wooziness may be due to slightly low blood pressures as well as dehydration.  Please make sure you are eating and drinking well in the mornings before work. -Decrease her hydrochlorothiazide to 12.5 mg daily in the mornings -Continue taking losartan 50 mg at nighttime -Please follow-up in 1 month so we can repeat your blood pressure, you may need additional blood pressure medicines since we are going down on your current medication  Nose pain I believe your runny nose and crusting is due to allergies. Please start cetirizine 10 mg daily to help with this You can continue using your nasal spray Allergy medicine will likely help with your runny nose and your nose heal.   Follow-up in 1 month for repeat blood pressure

## 2022-08-07 NOTE — Progress Notes (Signed)
Internal Medicine Clinic Attending ? ?Case discussed with Dr. Liang  At the time of the visit.  We reviewed the resident?s history and exam and pertinent patient test results.  I agree with the assessment, diagnosis, and plan of care documented in the resident?s note. ? ?

## 2022-08-08 ENCOUNTER — Other Ambulatory Visit (HOSPITAL_COMMUNITY): Payer: Self-pay

## 2022-08-22 ENCOUNTER — Encounter: Payer: Self-pay | Admitting: Student in an Organized Health Care Education/Training Program

## 2022-08-22 NOTE — Telephone Encounter (Signed)
Patient walked in with daughter. States she has been taking vitamins pictured x 2 weeks at the recommendation of her hair stylist for alopecia. States she feels well. Has noticed that she is breathing better through her nose, no longer has "crusts" in nostrils. Has noted improvement in GI sx. Now has daily BM. She wanted PCP to be aware of added supplements.

## 2022-08-29 ENCOUNTER — Other Ambulatory Visit (HOSPITAL_COMMUNITY): Payer: Self-pay

## 2022-08-30 ENCOUNTER — Other Ambulatory Visit (HOSPITAL_COMMUNITY): Payer: Self-pay

## 2022-09-07 ENCOUNTER — Other Ambulatory Visit (HOSPITAL_COMMUNITY): Payer: Self-pay

## 2022-09-13 ENCOUNTER — Encounter: Payer: Self-pay | Admitting: *Deleted

## 2022-09-28 ENCOUNTER — Other Ambulatory Visit: Payer: Self-pay

## 2022-09-28 ENCOUNTER — Other Ambulatory Visit (HOSPITAL_COMMUNITY): Payer: Self-pay

## 2022-09-28 ENCOUNTER — Ambulatory Visit (INDEPENDENT_AMBULATORY_CARE_PROVIDER_SITE_OTHER): Payer: Self-pay | Admitting: Student

## 2022-09-28 ENCOUNTER — Encounter: Payer: Self-pay | Admitting: Student

## 2022-09-28 VITALS — BP 120/61 | HR 86 | Temp 97.8°F | Ht 62.0 in | Wt 222.2 lb

## 2022-09-28 DIAGNOSIS — L739 Follicular disorder, unspecified: Secondary | ICD-10-CM | POA: Insufficient documentation

## 2022-09-28 MED ORDER — CLINDAMYCIN PHOSPHATE 1 % EX LOTN
TOPICAL_LOTION | Freq: Two times a day (BID) | CUTANEOUS | 0 refills | Status: DC
  Filled 2022-09-28: qty 60, 30d supply, fill #0

## 2022-09-28 NOTE — Patient Instructions (Addendum)
Thank you so much for coming to the clinic today!   I believe you may have something called folliculitis. I am sending in an antibiotic cream called Clindamycin, please use it twice a day. If the boils do not get better please come back to the clinic.   If you have any questions please feel free to the call the clinic at anytime at [phone]. It was a pleasure seeing you!  Best, Dr. Thomasene Ripple
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
## 2022-09-28 NOTE — Assessment & Plan Note (Addendum)
Pt presents with pustules that have presented for the last 3-4 weeks. This is the first time this has happened to her. She does follow with a dermatologist, but did not bring this up during the appointment yesterday. The pustules are located on the left axilla, underneath her bra strap. Please see photo.  They are not located anywhere else on her body, and they are not itchy.  She has popped a few of them, who drains white pustulant material.  She denies any systemic symptoms such as fevers, chills, nausea, vomiting. I believe her symptoms are consistent with folliculitis. Recommended patient to wear looser clothing, as well as clindamycin cream.   Plan:  - Clindamycin cream  - Looser clothing

## 2022-10-01 NOTE — Progress Notes (Signed)
Internal Medicine Clinic Attending  Case discussed with Dr. Nooruddin  At the time of the visit.  We reviewed the resident's history and exam and pertinent patient test results.  I agree with the assessment, diagnosis, and plan of care documented in the resident's note.  

## 2022-10-03 ENCOUNTER — Other Ambulatory Visit (HOSPITAL_COMMUNITY): Payer: Self-pay

## 2022-10-17 ENCOUNTER — Ambulatory Visit (INDEPENDENT_AMBULATORY_CARE_PROVIDER_SITE_OTHER): Payer: Self-pay | Admitting: Student

## 2022-10-17 ENCOUNTER — Encounter: Payer: Self-pay | Admitting: Student

## 2022-10-17 ENCOUNTER — Other Ambulatory Visit: Payer: Self-pay

## 2022-10-17 VITALS — BP 131/81 | HR 82 | Temp 97.8°F | Ht 62.0 in | Wt 225.3 lb

## 2022-10-17 DIAGNOSIS — R58 Hemorrhage, not elsewhere classified: Secondary | ICD-10-CM

## 2022-10-17 DIAGNOSIS — I10 Essential (primary) hypertension: Secondary | ICD-10-CM

## 2022-10-17 LAB — POCT URINALYSIS DIPSTICK
Bilirubin, UA: NEGATIVE
Blood, UA: NEGATIVE
Glucose, UA: NEGATIVE
Ketones, UA: NEGATIVE
Nitrite, UA: NEGATIVE
Protein, UA: NEGATIVE
Spec Grav, UA: 1.025 (ref 1.010–1.025)
Urobilinogen, UA: 0.2 E.U./dL
pH, UA: 5.5 (ref 5.0–8.0)

## 2022-10-17 NOTE — Assessment & Plan Note (Signed)
BP controlled at 131/81 today. She is taking HCTZ 12.5 mg daily and losartan 50 mg daily. Tolerating well and reports adherence. Asymptomatic today.   Plan -Continue losartan and HCTZ

## 2022-10-17 NOTE — Patient Instructions (Signed)
Thank you, [name] for allowing [place] to provide your care today. Today we discussed about noticing blood on toilet paper. No blood in the urine sample today. If you notice blood in your urine, or vaginal bleeding, or blood in your stools, please contact our office to be evaluated.     I have ordered the following labs for you:  Lab Orders         POCT Urinalysis Dipstick (16109)      Follow up:  3-4 months    Should you have any questions or concerns please call the internal medicine clinic at [phone].    Rana Snare, D.O. [facility]
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
## 2022-10-17 NOTE — Assessment & Plan Note (Signed)
Initially patient stated blood in urine but upon further clarification she noted only pink/light red on toilet paper after voiding. Infrequent and patient states maybe once a month or so she has noticed it. Denies blood in urine in toilet bowel. Denies dysuria or discharge. Denies vaginal bleeding or blood on underwear. Denies vaginal itching or dryness. Denies hematochezia and melena. No blood when wiping after BM. Her vitals are stable. Urine dipstick negative for blood. Reassured and counseled patient to monitor if she notices hematuria or other signs of bleeding. If hematuria, could consider renal ultrasound for further evaluation. Return precautions provided.

## 2022-10-23 NOTE — Progress Notes (Signed)
Internal Medicine Clinic Attending  Case discussed with the resident at the time of the visit.  We reviewed the resident's history and exam and pertinent patient test results.  I agree with the assessment, diagnosis, and plan of care documented in the resident's note.  

## 2022-10-24 ENCOUNTER — Other Ambulatory Visit (HOSPITAL_COMMUNITY): Payer: Self-pay

## 2022-11-02 ENCOUNTER — Other Ambulatory Visit (HOSPITAL_COMMUNITY): Payer: Self-pay

## 2022-11-02 ENCOUNTER — Emergency Department (HOSPITAL_COMMUNITY)
Admission: EM | Admit: 2022-11-02 | Discharge: 2022-11-03 | Disposition: A | Discharge status: Home / Self Care | Payer: 59 | Attending: Emergency Medicine | Admitting: Emergency Medicine

## 2022-11-02 DIAGNOSIS — Z79899 Other long term (current) drug therapy: Secondary | ICD-10-CM | POA: Insufficient documentation

## 2022-11-02 DIAGNOSIS — R739 Hyperglycemia, unspecified: Secondary | ICD-10-CM | POA: Diagnosis not present

## 2022-11-02 DIAGNOSIS — I10 Essential (primary) hypertension: Secondary | ICD-10-CM | POA: Diagnosis not present

## 2022-11-02 DIAGNOSIS — Z91040 Latex allergy status: Secondary | ICD-10-CM | POA: Diagnosis not present

## 2022-11-02 DIAGNOSIS — E1165 Type 2 diabetes mellitus with hyperglycemia: Secondary | ICD-10-CM | POA: Diagnosis not present

## 2022-11-02 LAB — BASIC METABOLIC PANEL
Anion gap: 10 (ref 5–15)
BUN: 21 mg/dL (ref 8–23)
CO2: 22 mmol/L (ref 22–32)
Calcium: 8.9 mg/dL (ref 8.9–10.3)
Chloride: 102 mmol/L (ref 98–111)
Creatinine, Ser: 0.86 mg/dL (ref 0.44–1.00)
GFR, Estimated: >60 mL/min (ref >60)
Glucose, Bld: 382 mg/dL — ABNORMAL HIGH (ref 70–99)
Potassium: 3.8 mmol/L (ref 3.5–5.1)
Sodium: 134 mmol/L — ABNORMAL LOW (ref 135–145)

## 2022-11-02 LAB — URINALYSIS, ROUTINE W REFLEX MICROSCOPIC
Bilirubin Urine: NEGATIVE
Glucose, UA: >=500 mg/dL — AB
Hgb urine dipstick: NEGATIVE
Ketones, ur: NEGATIVE mg/dL
Nitrite: NEGATIVE
Protein, ur: NEGATIVE mg/dL
Specific Gravity, Urine: 1.023 (ref 1.005–1.030)
pH: 5 (ref 5.0–8.0)

## 2022-11-02 LAB — BLOOD GAS, VENOUS
Acid-Base Excess: 2.6 mmol/L — ABNORMAL HIGH (ref 0.0–2.0)
Bicarbonate: 27.2 mmol/L (ref 20.0–28.0)
O2 Saturation: 91 %
Patient temperature: 37
pCO2, Ven: 41 mmHg — ABNORMAL LOW (ref 44–60)
pH, Ven: 7.43 (ref 7.25–7.43)
pO2, Ven: 61 mmHg — ABNORMAL HIGH (ref 32–45)

## 2022-11-02 LAB — CBC
HCT: 37.1 % (ref 36.0–46.0)
Hemoglobin: 11.9 g/dL — ABNORMAL LOW (ref 12.0–15.0)
MCH: 28.1 pg (ref 26.0–34.0)
MCHC: 32.1 g/dL (ref 30.0–36.0)
MCV: 87.7 fL (ref 80.0–100.0)
Platelets: 296 10*3/uL (ref 150–400)
RBC: 4.23 MIL/uL (ref 3.87–5.11)
RDW: 13.8 % (ref 11.5–15.5)
WBC: 9.8 10*3/uL (ref 4.0–10.5)
nRBC: 0 % (ref 0.0–0.2)

## 2022-11-02 LAB — CBG MONITORING, ED: Glucose-Capillary: 437 mg/dL — ABNORMAL HIGH (ref 70–99)

## 2022-11-02 MED ORDER — LACTATED RINGERS IV BOLUS
1000.0000 mL | Freq: Once | INTRAVENOUS | Status: AC
  Administered 2022-11-02: 1000 mL via INTRAVENOUS

## 2022-11-02 NOTE — ED Triage Notes (Signed)
Pt states that she took Metformin in the past so she regularly checks her blood glucose level and tonight her monitor read "high", Pt denies any physical symptoms at this time.

## 2022-11-02 NOTE — ED Notes (Signed)
Urine sent to main lab.

## 2022-11-02 NOTE — ED Provider Notes (Incomplete)
[facility] AT [facility] Provider Note   CSN: [id number] Arrival date & time: 11/02/22  2221     History {Add pertinent medical, surgical, social history, OB history to HPI:1} Chief Complaint  Patient presents with   Hyperglycemia    Danielle Mason is a 62 y.o. female.  Patient with a history of hypertension, hypercholesterolemia presenting with elevated blood sugar.  States she was on metformin several years ago for questionable diabetes but was told by her doctor she does not normally need it.  She is no longer on metformin or any other medications for diabetes.  States she feels well.  She had oysters and shrimp as well as a pork chop for dinner.  States her sugar was 437.  She asked her husband to check it because she felt "strange".  Denies any dizziness, lightheadedness, chest pain, shortness of breath, nausea or vomiting.  No pain with urination or blood in the urine.  No fever. Reports she recently had a hemoglobin A1c of 6.6.  The history is provided by the patient and a relative.  Hyperglycemia Associated symptoms: no abdominal pain, no chest pain, no dizziness, no dysuria, no fever, no nausea, no shortness of breath, no vomiting and no weakness        Home Medications Prior to Admission medications   Medication Sig Start Date End Date Taking? Authorizing Provider  acetaminophen (TYLENOL) 325 MG tablet Take 325 mg by mouth every 6 (six) hours as needed for mild pain or headache.    [provider]  atorvastatin (LIPITOR) 40 MG tablet Take 1 tablet (40 mg total) by mouth daily. 01/30/22   Champ Mungo, DO  cetirizine (ZYRTEC ALLERGY) 10 MG tablet Take 1 tablet (10 mg total) by mouth daily. 08/06/22   Quincy Simmonds, MD  clindamycin (CLEOCIN T) 1 % lotion Apply topically 2 (two) times daily. 09/28/22   Nooruddin, Jason Fila, MD  Estradiol 10 MCG TABS vaginal tablet Insert 1 tablet intravaginally once daily for 2 weeks, then insert 1 tablet  twice weekly 01/19/22   Adron Bene, MD  fluticasone (FLONASE) 50 MCG/ACT nasal spray Place 1 spray into both nostrils daily. 04/12/22   Tyson Alias, MD  hydrochlorothiazide (HYDRODIURIL) 12.5 MG tablet Take 1 tablet (12.5 mg total) by mouth in the morning. 08/06/22   Quincy Simmonds, MD  losartan (COZAAR) 50 MG tablet Take 1 tablet (50 mg total) by mouth daily. 05/02/22   Quincy Simmonds, MD  sodium chloride (OCEAN) 0.65 % SOLN nasal spray Place 2 sprays into both nostrils as needed for congestion. 08/06/22 10/05/22  Quincy Simmonds, MD  dicyclomine (BENTYL) 20 MG tablet Take 1 tablet (20 mg total) by mouth 2 (two) times daily. Patient not taking: No sig reported 04/12/20 06/27/20  Arthor Captain, PA-C      Allergies    Eicosapentaenoic acid (epa), Fish-derived products, Penicillins, and Latex    Review of Systems   Review of Systems  Constitutional:  Negative for activity change, appetite change and fever.  HENT:  Negative for congestion.   Respiratory:  Negative for cough, choking, chest tightness and shortness of breath.   Cardiovascular:  Negative for chest pain.  Gastrointestinal:  Negative for abdominal pain, nausea and vomiting.  Genitourinary:  Negative for dysuria and hematuria.  Musculoskeletal:  Negative for arthralgias and myalgias.  Skin:  Negative for rash.  Neurological:  Negative for dizziness, weakness and headaches.   all other systems are negative except as noted in the HPI  and PMH.    Physical Exam Updated Vital Signs BP 134/81   Pulse 87   Temp 98 F (36.7 C) (Oral)   Resp 18   Ht 5\' 2"  (1.575 m)   Wt 100.2 kg   SpO2 99%   BMI 40.42 kg/m  Physical Exam Vitals and nursing note reviewed.  Constitutional:      General: She is not in acute distress.    Appearance: She is well-developed.  HENT:     Head: Normocephalic and atraumatic.     Mouth/Throat:     Pharynx: No oropharyngeal exudate.  Eyes:     Conjunctiva/sclera: Conjunctivae normal.      Pupils: Pupils are equal, round, and reactive to light.  Neck:     Comments: No meningismus. Cardiovascular:     Rate and Rhythm: Normal rate and regular rhythm.     Heart sounds: Normal heart sounds. No murmur heard. Pulmonary:     Effort: Pulmonary effort is normal. No respiratory distress.     Breath sounds: Normal breath sounds.  Abdominal:     Palpations: Abdomen is soft.     Tenderness: There is no abdominal tenderness. There is no guarding or rebound.  Musculoskeletal:        General: No tenderness. Normal range of motion.     Cervical back: Normal range of motion and neck supple.  Skin:    General: Skin is warm.  Neurological:     Mental Status: She is alert and oriented to person, place, and time.     Cranial Nerves: No cranial nerve deficit.     Motor: No abnormal muscle tone.     Coordination: Coordination normal.     Comments: No ataxia on finger to nose bilaterally. No pronator drift. 5/5 strength throughout. CN 2-12 intact.Equal grip strength. Sensation intact.   Psychiatric:        Behavior: Behavior normal.     ED Results / Procedures / Treatments   Labs (all labs ordered are listed, but only abnormal results are displayed) Labs Reviewed  CBG MONITORING, ED - Abnormal; Notable for the following components:      Result Value   Glucose-Capillary 437 (*)    All other components within normal limits  BASIC METABOLIC PANEL  CBC  URINALYSIS, ROUTINE W REFLEX MICROSCOPIC  BLOOD GAS, VENOUS  LACTIC ACID, PLASMA  LACTIC ACID, PLASMA  BETA-HYDROXYBUTYRIC ACID  CBG MONITORING, ED    EKG None  Radiology No results found.  Procedures Procedures  {Document cardiac monitor, telemetry assessment procedure when appropriate:1}  Medications Ordered in ED Medications  lactated ringers bolus 1,000 mL (has no administration in time range)    ED Course/ Medical Decision Making/ A&P   {   Click here for ABCD2, HEART and other calculatorsREFRESH Note before  signing :1}                          Medical Decision Making Amount and/or Complexity of Data Reviewed Labs: ordered. Decision-making details documented in ED Course. Radiology: ordered and independent interpretation performed. Decision-making details documented in ED Course. ECG/medicine tests: ordered and independent interpretation performed. Decision-making details documented in ED Course.   Hyperglycemia with concern for diabetes.  Vitals are stable.  {Document critical care time when appropriate:1} {Document review of labs and clinical decision tools ie heart score, Chads2Vasc2 etc:1}  {Document your independent review of radiology images, and any outside records:1} {Document your discussion with family members, caretakers, and  with consultants:1} {Document social determinants of health affecting pt's care:1} {Document your decision making why or why not admission, treatments were needed:1} Final Clinical Impression(s) / ED Diagnoses Final diagnoses:  None    Rx / DC Orders ED Discharge Orders     None

## 2022-11-03 LAB — CBG MONITORING, ED
Glucose-Capillary: 180 mg/dL — ABNORMAL HIGH (ref 70–99)
Glucose-Capillary: 252 mg/dL — ABNORMAL HIGH (ref 70–99)
Glucose-Capillary: 292 mg/dL — ABNORMAL HIGH (ref 70–99)

## 2022-11-03 LAB — LACTIC ACID, PLASMA
Lactic Acid, Venous: 2.2 mmol/L (ref 0.5–1.9)
Lactic Acid, Venous: 2.7 mmol/L (ref 0.5–1.9)
Lactic Acid, Venous: 3 mmol/L (ref 0.5–1.9)

## 2022-11-03 LAB — BETA-HYDROXYBUTYRIC ACID: Beta-Hydroxybutyric Acid: 0.13 mmol/L (ref 0.05–0.27)

## 2022-11-03 MED ORDER — METFORMIN HCL 500 MG PO TABS
500.0000 mg | ORAL_TABLET | Freq: Two times a day (BID) | ORAL | 0 refills | Status: DC
  Filled 2022-11-03: qty 60, 30d supply, fill #0

## 2022-11-03 MED ORDER — SODIUM CHLORIDE 0.9 % IV SOLN
1.0000 g | Freq: Once | INTRAVENOUS | Status: AC
  Administered 2022-11-03: 1 g via INTRAVENOUS
  Filled 2022-11-03: qty 10

## 2022-11-03 MED ORDER — CEPHALEXIN 500 MG PO CAPS
500.0000 mg | ORAL_CAPSULE | Freq: Three times a day (TID) | ORAL | 0 refills | Status: AC
Start: 2022-11-03 — End: ?
  Filled 2022-11-03: qty 21, 7d supply, fill #0

## 2022-11-03 MED ORDER — LACTATED RINGERS IV BOLUS: 1000.0000 mL | Freq: Once | INTRAVENOUS | Status: DC

## 2022-11-03 MED ORDER — BLOOD GLUCOSE MONITOR KIT
PACK | 0 refills | Status: AC
  Filled 2022-11-03: qty 1, 30d supply, fill #0

## 2022-11-03 MED ORDER — LACTATED RINGERS IV BOLUS
1000.0000 mL | Freq: Once | INTRAVENOUS | Status: AC
  Administered 2022-11-03: 1000 mL via INTRAVENOUS

## 2022-11-03 MED ORDER — INSULIN ASPART 100 UNIT/ML IJ SOLN
5.0000 [IU] | Freq: Once | INTRAMUSCULAR | Status: AC
  Administered 2022-11-03: 5 [IU] via SUBCUTANEOUS
  Filled 2022-11-03: qty 0.05

## 2022-11-03 MED ORDER — SODIUM CHLORIDE 0.9 % IV SOLN: Freq: Once | INTRAVENOUS | Status: AC

## 2022-11-03 NOTE — Discharge Instructions (Signed)
We suspect you are diabetic. Check your blood sugar 1-2 times daily and keep a record to show your doctor. Take the metformin as prescribed. Return to the ED with difficulty breathing, chest pain, or any other concerns.

## 2022-11-04 LAB — URINE CULTURE: Culture: 20000 — AB

## 2022-11-05 ENCOUNTER — Encounter: Payer: Self-pay | Admitting: Internal Medicine

## 2022-11-05 ENCOUNTER — Other Ambulatory Visit: Payer: Self-pay

## 2022-11-05 ENCOUNTER — Ambulatory Visit (INDEPENDENT_AMBULATORY_CARE_PROVIDER_SITE_OTHER): Payer: 59 | Admitting: Internal Medicine

## 2022-11-05 ENCOUNTER — Other Ambulatory Visit (HOSPITAL_COMMUNITY): Payer: Self-pay

## 2022-11-05 ENCOUNTER — Telehealth (HOSPITAL_BASED_OUTPATIENT_CLINIC_OR_DEPARTMENT_OTHER): Payer: Self-pay

## 2022-11-05 VITALS — BP 114/69 | HR 75 | Temp 97.7°F | Ht 62.0 in | Wt 221.0 lb

## 2022-11-05 DIAGNOSIS — E119 Type 2 diabetes mellitus without complications: Secondary | ICD-10-CM

## 2022-11-05 LAB — CBG MONITORING, ED: Glucose-Capillary: 206 mg/dL — ABNORMAL HIGH (ref 70–99)

## 2022-11-05 LAB — POCT GLYCOSYLATED HEMOGLOBIN (HGB A1C): Hemoglobin A1C: 8.6 % — AB (ref 4.0–5.6)

## 2022-11-05 LAB — GLUCOSE, CAPILLARY: Glucose-Capillary: 182 mg/dL — ABNORMAL HIGH (ref 70–99)

## 2022-11-05 MED ORDER — METFORMIN HCL 500 MG PO TABS
ORAL_TABLET | ORAL | 0 refills | Status: DC
Start: 2022-11-05 — End: 2023-01-08
  Filled 2022-11-05 – 2022-12-07 (×2): qty 60, 24d supply, fill #0

## 2022-11-05 MED ORDER — FOSFOMYCIN TROMETHAMINE 3 G PO PACK
3.0000 g | PACK | Freq: Once | ORAL | 0 refills | Status: AC
  Filled 2022-11-05: qty 3, 1d supply, fill #0
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
## 2022-11-05 NOTE — Progress Notes (Signed)
ED Antimicrobial Stewardship Positive Culture Follow Up   Danielle Mason is an 63 y.o. female who presented to [facility] on 11/02/2022 with a chief complaint of  Chief Complaint  Patient presents with   Hyperglycemia    Recent Results (from the past 720 hour(s))  Urine Culture (for pregnant, neutropenic or urologic patients or patients with an indwelling urinary catheter)     Status: Abnormal   Collection Time: 11/02/22 11:07 PM   Specimen: Urine, Clean Catch  Result Value Ref Range Status   Specimen Description   Final    URINE, CLEAN CATCH Performed at [facility], [address], [place], [place] [address]    Special Requests   Final    NONE Performed at [facility], [address] [place], [place] [address]    Culture (A)  Final    20,000 COLONIES/mL STREPTOCOCCUS AGALACTIAE TESTING AGAINST S. AGALACTIAE NOT ROUTINELY PERFORMED DUE TO PREDICTABILITY OF AMP/PEN/VAN SUSCEPTIBILITY. Performed at [facility], [address] [place], [place] [address]    Report Status 11/04/2022 FINAL  Final    [x]  Needs to stop antibiotics  61 YOF who presented on 7/20 with hyperglycemia, likely new DM diagnosis, started on metformin with PCP follow-up.  No urinary symptoms noted, Afeb, WBC WNL, UTI treatment triggered off of UA that showed WBC 21-50, culture grew 20k GBS which is a known colonizer  Call patient to stop antibiotics - not needed, no infection noted  ED Provider: Pricilla Loveless, MD  Thank you for allowing pharmacy to be a part of this patient's care.  Georgina Pillion, PharmD, BCPS, BCIDP Infectious Diseases Clinical Pharmacist 11/05/2022 10:46 AM   **Pharmacist phone directory can now be found on [email or web address] ([email or web address]).  Listed under [facility].

## 2022-11-05 NOTE — Telephone Encounter (Signed)
Post ED Visit - Positive Culture Follow-up  Culture report reviewed by antimicrobial stewardship pharmacist: [facility] []  Enzo Bi, Pharm.D. []  Celedonio Miyamoto, Pharm.D., BCPS AQ-ID []  Garvin Fila, Pharm.D., BCPS [x]  Georgina Pillion, Pharm.D., BCPS []  [place], [place].D., BCPS, AAHIVP []  Estella Husk, Pharm.D., BCPS, AAHIVP []  Lysle Pearl, PharmD, BCPS []  Phillips Climes, PharmD, BCPS []  Agapito Games, PharmD, BCPS []  Verlan Friends, PharmD []  Mervyn Gay, PharmD, BCPS []  Vinnie Level, PharmD  [facility] []  Len Childs, PharmD []  Greer Pickerel, PharmD []  Adalberto Cole, PharmD []  Perlie Gold, Rph []  Lonell Face) Jean Rosenthal, PharmD []  Earl Many, PharmD []  Junita Push, PharmD []  Dorna Leitz, PharmD []  Terrilee Files, PharmD []  Lynann Beaver, PharmD []  Keturah Barre, PharmD []  Loralee Pacas, PharmD []  Bernadene Person, PharmD   Positive urine culture Plan: Call patient to stop Keflex, not needed. Reviewed by ED provider Pricilla Loveless, MD.   Spoke with patient and instructions given pt states she understand and no further patient follow-up is required at this time.  Sandria Senter 11/05/2022, 3:18 PM

## 2022-11-05 NOTE — Patient Instructions (Addendum)
Thank you, Danielle Mason for allowing [place] to provide your care today.   Diabetes: Please start taking metformin 500 mg daily for the next 7 days. If you do not have diarrhea or stomach upset on this dose I would like to increase dose. Week 1 metformin 500 mg daily Week 2 metformin 500 mg 2 x daily Week 3 metformin 1000 mg 2 x daily  I included some information about shingrix, this is a vaccine to prevent shingles. You can get this vaccine at your local pharmacy.  I have ordered the following medication/changed the following medications:   Stop the following medications: Medications Discontinued During This Encounter  Medication Reason   metFORMIN (GLUCOPHAGE) 500 MG tablet      Start the following medications: Meds ordered this encounter  Medications   metFORMIN (GLUCOPHAGE) 500 MG tablet    Sig: Take 1 tablet (500 mg total) by mouth daily with breakfast for 7 days, THEN 1 tablet (500 mg total) 2 (two) times daily with a meal for 7 days, THEN 2 tablets (1,000 mg total) 2 (two) times daily with a meal.    Dispense:  60 tablet    Refill:  0     Follow up: 4 weeks  We look forward to seeing you next time. Please call our clinic at [phone] if you have any questions or concerns. The best time to call is Monday-Friday from 9am-4pm, but there is someone available 24/7. If after hours or the weekend, call the main hospital number and ask for the Internal Medicine Resident On-Call. If you need medication refills, please notify your pharmacy one week in advance and they will send [place] a request.   Thank you for trusting me with your care. Wishing you the best!   Rudene Christians, DO [facility]

## 2022-11-05 NOTE — Assessment & Plan Note (Addendum)
She previously took metformin but has not had to take medications for her diabetes in several years. A1c elevated at 8.6 today from 6.6 5 months ago. She endorses frequent dietary discretions over the last few months. She has gained about 10 lbs. She was treated for UTI in ED with fosfomycin however she was asymptomatic and urine culture grew <20,000. P: Start metformin with titration goal as tolerated to 1000mg  BID She is hesitant to start additional medications as long term she would like to come off of medication. Follow-up in 1 month

## 2022-11-06 ENCOUNTER — Other Ambulatory Visit (HOSPITAL_COMMUNITY): Payer: Self-pay

## 2022-11-06 NOTE — Progress Notes (Signed)
Internal Medicine Clinic Attending  Case discussed with the resident at the time of the visit.  We reviewed the resident's history and exam and pertinent patient test results.  I agree with the assessment, diagnosis, and plan of care documented in the resident's note.    Dr Sloan Leiter also discussed GLP1 with patient, which could be helpful with weight management & diabetes. As Dr Sloan Leiter mentioned, patient would prefer not to start multiple new medicines at this time. If A1C not improved with Metformin & lifestyle changes, could reconsider GLP1 at future visit.

## 2022-11-07 ENCOUNTER — Other Ambulatory Visit (HOSPITAL_COMMUNITY): Payer: Self-pay

## 2022-11-16 DIAGNOSIS — Z79899 Other long term (current) drug therapy: Secondary | ICD-10-CM | POA: Diagnosis not present

## 2022-11-16 DIAGNOSIS — H538 Other visual disturbances: Secondary | ICD-10-CM | POA: Diagnosis not present

## 2022-11-16 DIAGNOSIS — I10 Essential (primary) hypertension: Secondary | ICD-10-CM | POA: Diagnosis not present

## 2022-11-16 DIAGNOSIS — E1165 Type 2 diabetes mellitus with hyperglycemia: Secondary | ICD-10-CM | POA: Diagnosis not present

## 2022-11-16 DIAGNOSIS — Z7984 Long term (current) use of oral hypoglycemic drugs: Secondary | ICD-10-CM | POA: Diagnosis not present

## 2022-11-19 ENCOUNTER — Other Ambulatory Visit (HOSPITAL_COMMUNITY): Payer: Self-pay

## 2022-11-27 ENCOUNTER — Other Ambulatory Visit (HOSPITAL_COMMUNITY): Payer: Self-pay

## 2022-11-28 ENCOUNTER — Other Ambulatory Visit (HOSPITAL_COMMUNITY): Payer: Self-pay

## 2022-12-03 ENCOUNTER — Encounter: Payer: 59 | Admitting: Student

## 2022-12-07 ENCOUNTER — Other Ambulatory Visit (HOSPITAL_COMMUNITY): Payer: Self-pay

## 2022-12-11 ENCOUNTER — Encounter (HOSPITAL_COMMUNITY): Payer: Self-pay

## 2022-12-11 ENCOUNTER — Emergency Department (HOSPITAL_COMMUNITY)
Admission: EM | Admit: 2022-12-11 | Discharge: 2022-12-11 | Disposition: A | Discharge status: Home / Self Care | Payer: 59 | Attending: Emergency Medicine | Admitting: Emergency Medicine

## 2022-12-11 ENCOUNTER — Other Ambulatory Visit: Payer: Self-pay

## 2022-12-11 DIAGNOSIS — S81811A Laceration without foreign body, right lower leg, initial encounter: Secondary | ICD-10-CM | POA: Insufficient documentation

## 2022-12-11 DIAGNOSIS — S60511A Abrasion of right hand, initial encounter: Secondary | ICD-10-CM | POA: Diagnosis not present

## 2022-12-11 DIAGNOSIS — Z23 Encounter for immunization: Secondary | ICD-10-CM | POA: Diagnosis not present

## 2022-12-11 DIAGNOSIS — I10 Essential (primary) hypertension: Secondary | ICD-10-CM | POA: Diagnosis not present

## 2022-12-11 DIAGNOSIS — Z7984 Long term (current) use of oral hypoglycemic drugs: Secondary | ICD-10-CM | POA: Insufficient documentation

## 2022-12-11 DIAGNOSIS — Y92410 Unspecified street and highway as the place of occurrence of the external cause: Secondary | ICD-10-CM | POA: Insufficient documentation

## 2022-12-11 DIAGNOSIS — Z91040 Latex allergy status: Secondary | ICD-10-CM | POA: Diagnosis not present

## 2022-12-11 DIAGNOSIS — S60419A Abrasion of unspecified finger, initial encounter: Secondary | ICD-10-CM

## 2022-12-11 DIAGNOSIS — S80811A Abrasion, right lower leg, initial encounter: Secondary | ICD-10-CM

## 2022-12-11 DIAGNOSIS — Z79899 Other long term (current) drug therapy: Secondary | ICD-10-CM | POA: Insufficient documentation

## 2022-12-11 DIAGNOSIS — E119 Type 2 diabetes mellitus without complications: Secondary | ICD-10-CM | POA: Diagnosis not present

## 2022-12-11 MED ORDER — TETANUS-DIPHTH-ACELL PERTUSSIS 5-2.5-18.5 LF-MCG/0.5 IM SUSY
0.5000 mL | PREFILLED_SYRINGE | Freq: Once | INTRAMUSCULAR | Status: AC
  Administered 2022-12-11: 0.5 mL via INTRAMUSCULAR
  Filled 2022-12-11: qty 0.5
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
## 2022-12-19 ENCOUNTER — Other Ambulatory Visit (HOSPITAL_COMMUNITY): Payer: Self-pay

## 2022-12-19 MED ORDER — CYCLOBENZAPRINE HCL 5 MG PO TABS
5.0000 mg | ORAL_TABLET | Freq: Every evening | ORAL | 0 refills | Status: DC | PRN
  Filled 2022-12-19: qty 30, 15d supply, fill #0

## 2022-12-24 ENCOUNTER — Encounter: Payer: 59 | Admitting: Student

## 2022-12-25 ENCOUNTER — Ambulatory Visit (HOSPITAL_COMMUNITY)
Admission: RE | Admit: 2022-12-25 | Discharge: 2022-12-25 | Disposition: A | Discharge status: Home / Self Care | Payer: 59 | Source: Ambulatory Visit | Attending: Internal Medicine | Admitting: Internal Medicine

## 2022-12-25 ENCOUNTER — Ambulatory Visit (INDEPENDENT_AMBULATORY_CARE_PROVIDER_SITE_OTHER): Payer: 59 | Admitting: Student

## 2022-12-25 ENCOUNTER — Other Ambulatory Visit (HOSPITAL_COMMUNITY): Payer: Self-pay

## 2022-12-25 VITALS — BP 129/74 | HR 105 | Temp 97.5°F | Resp 20 | Ht 62.0 in | Wt 216.9 lb

## 2022-12-25 DIAGNOSIS — M7989 Other specified soft tissue disorders: Secondary | ICD-10-CM | POA: Diagnosis not present

## 2022-12-25 DIAGNOSIS — M7732 Calcaneal spur, left foot: Secondary | ICD-10-CM | POA: Diagnosis not present

## 2022-12-25 DIAGNOSIS — M25572 Pain in left ankle and joints of left foot: Secondary | ICD-10-CM | POA: Insufficient documentation

## 2022-12-25 MED ORDER — DICLOFENAC SODIUM 1 % EX GEL
2.0000 g | Freq: Four times a day (QID) | CUTANEOUS | 0 refills | Status: DC
  Filled 2022-12-25: qty 100, 12d supply, fill #0

## 2022-12-25 NOTE — Patient Instructions (Addendum)
Thank you so much for coming to the clinic today!   I am ordering a left ankle xray, I'm hoping we get a better idea of what is going on inside of your ankle.  Also sending in Voltaren gel which should help with the pain.  If you have any questions please feel free to the call the clinic at anytime at [phone]. It was a pleasure seeing you!  Best, Dr. Thomasene Ripple

## 2022-12-26 DIAGNOSIS — M722 Plantar fascial fibromatosis: Secondary | ICD-10-CM | POA: Insufficient documentation

## 2022-12-26 DIAGNOSIS — M25572 Pain in left ankle and joints of left foot: Secondary | ICD-10-CM | POA: Insufficient documentation
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
## 2023-01-08 ENCOUNTER — Other Ambulatory Visit: Payer: Self-pay | Admitting: Internal Medicine

## 2023-01-08 ENCOUNTER — Other Ambulatory Visit (HOSPITAL_COMMUNITY): Payer: Self-pay

## 2023-01-08 DIAGNOSIS — E119 Type 2 diabetes mellitus without complications: Secondary | ICD-10-CM

## 2023-01-10 ENCOUNTER — Other Ambulatory Visit (HOSPITAL_COMMUNITY): Payer: Self-pay

## 2023-01-10 ENCOUNTER — Telehealth: Payer: Self-pay

## 2023-01-10 ENCOUNTER — Other Ambulatory Visit: Payer: Self-pay

## 2023-01-10 ENCOUNTER — Encounter: Payer: Self-pay | Admitting: Internal Medicine

## 2023-01-10 ENCOUNTER — Other Ambulatory Visit: Payer: Self-pay | Admitting: Student

## 2023-01-10 DIAGNOSIS — Z1239 Encounter for other screening for malignant neoplasm of breast: Secondary | ICD-10-CM | POA: Insufficient documentation

## 2023-01-10 MED ORDER — METFORMIN HCL 500 MG PO TABS
ORAL_TABLET | ORAL | 0 refills | Status: DC
Start: 2023-01-10 — End: 2023-02-11
  Filled 2023-01-10: qty 60, 24d supply, fill #0

## 2023-01-10 NOTE — Telephone Encounter (Signed)
Requesting X-ray results, please call pt back.
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
## 2023-01-24 ENCOUNTER — Other Ambulatory Visit (HOSPITAL_COMMUNITY): Payer: Self-pay

## 2023-01-24 MED ORDER — CYCLOBENZAPRINE HCL 5 MG PO TABS
5.0000 mg | ORAL_TABLET | Freq: Every evening | ORAL | 0 refills | Status: DC
  Filled 2023-01-24: qty 30, 15d supply, fill #0

## 2023-01-31 ENCOUNTER — Ambulatory Visit (INDEPENDENT_AMBULATORY_CARE_PROVIDER_SITE_OTHER): Payer: 59 | Admitting: Student

## 2023-01-31 ENCOUNTER — Encounter: Payer: Self-pay | Admitting: Student

## 2023-01-31 ENCOUNTER — Other Ambulatory Visit: Payer: Self-pay

## 2023-01-31 VITALS — BP 129/71 | HR 103 | Temp 97.5°F | Ht 62.0 in | Wt 221.4 lb

## 2023-01-31 DIAGNOSIS — M722 Plantar fascial fibromatosis: Secondary | ICD-10-CM | POA: Diagnosis not present

## 2023-01-31 DIAGNOSIS — Z1231 Encounter for screening mammogram for malignant neoplasm of breast: Secondary | ICD-10-CM

## 2023-01-31 NOTE — Patient Instructions (Addendum)
Thank you, [name] for allowing [place] to provide your care today. Today we discussed   Left foot pain - Continue physical therapy at [facility] - Try these exercises at home, I have attached a document. - Take OTC tylenol or ibuprofen as needed for pain control   Stretching exercises that can be tried include plantar and calf-plantar fascia stretches,  foot-ankle circles,  toe curls, toe towel curls and unilateral heel raises with toe dorsiflexion    I have ordered the following labs for you:  Lab Orders  No laboratory test(s) ordered today     Tests ordered today:  None  Referrals ordered today:   Referral Orders  No referral(s) requested today     I have ordered the following medication/changed the following medications:   Stop the following medications: There are no discontinued medications.   Start the following medications: No orders of the defined types were placed in this encounter.    Follow up:  2 weeks for DM     Remember:   Should you have any questions or concerns please call the internal medicine clinic at [phone].     Jeral Pinch, DO [facility]
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
## 2023-02-01 NOTE — Progress Notes (Signed)
Internal Medicine Clinic Attending  I was physically present during the key portions of the resident provided service and participated in the medical decision making of patient's management care. I reviewed pertinent patient test results.  The assessment, diagnosis, and plan were formulated together and I agree with the documentation in the resident's note.  Erlinda Hong, MD FACP

## 2023-02-01 NOTE — Assessment & Plan Note (Signed)
Dx bilateral mammogram on 03/02/2022 that showed multiple round masses in the right breast upper inner quadrant are indeterminant, Recommended [place] for further evaluation.  Patient reports that she has an upcoming appointment on November 11 for a repeat mammogram.
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
## 2023-02-05 ENCOUNTER — Other Ambulatory Visit (HOSPITAL_COMMUNITY): Payer: Self-pay

## 2023-02-11 ENCOUNTER — Other Ambulatory Visit (HOSPITAL_BASED_OUTPATIENT_CLINIC_OR_DEPARTMENT_OTHER): Payer: Self-pay

## 2023-02-11 ENCOUNTER — Other Ambulatory Visit: Payer: Self-pay | Admitting: Student

## 2023-02-11 ENCOUNTER — Other Ambulatory Visit (HOSPITAL_COMMUNITY): Payer: Self-pay

## 2023-02-11 DIAGNOSIS — E119 Type 2 diabetes mellitus without complications: Secondary | ICD-10-CM

## 2023-02-11 MED ORDER — METFORMIN HCL 1000 MG PO TABS
1000.0000 mg | ORAL_TABLET | Freq: Two times a day (BID) | ORAL | 0 refills | Status: DC
Start: 2023-02-11 — End: 2023-02-19
  Filled 2023-02-11: qty 60, 30d supply, fill #0

## 2023-02-11 NOTE — Telephone Encounter (Signed)
Next appt scheduled 11/5 with Dr Sloan Leiter.

## 2023-02-18 ENCOUNTER — Encounter: Payer: 59 | Admitting: Student

## 2023-02-18 ENCOUNTER — Other Ambulatory Visit (HOSPITAL_COMMUNITY): Payer: Self-pay

## 2023-02-18 ENCOUNTER — Other Ambulatory Visit: Payer: Self-pay

## 2023-02-18 DIAGNOSIS — S00211A Abrasion of right eyelid and periocular area, initial encounter: Secondary | ICD-10-CM | POA: Diagnosis not present

## 2023-02-18 DIAGNOSIS — L03213 Periorbital cellulitis: Secondary | ICD-10-CM | POA: Diagnosis not present

## 2023-02-18 DIAGNOSIS — H10402 Unspecified chronic conjunctivitis, left eye: Secondary | ICD-10-CM | POA: Diagnosis not present

## 2023-02-18 MED ORDER — ERYTHROMYCIN 5 MG/GM OP OINT
TOPICAL_OINTMENT | OPHTHALMIC | 0 refills | Status: DC
  Filled 2023-02-18: qty 3.5, 10d supply, fill #0

## 2023-02-18 MED ORDER — AZITHROMYCIN 250 MG PO TABS
ORAL_TABLET | ORAL | 0 refills | Status: DC
  Filled 2023-02-18: qty 6, 5d supply, fill #0

## 2023-02-19 ENCOUNTER — Other Ambulatory Visit (HOSPITAL_COMMUNITY): Payer: Self-pay

## 2023-02-19 ENCOUNTER — Encounter: Payer: Self-pay | Admitting: Internal Medicine

## 2023-02-19 ENCOUNTER — Ambulatory Visit (INDEPENDENT_AMBULATORY_CARE_PROVIDER_SITE_OTHER): Payer: 59 | Admitting: Internal Medicine

## 2023-02-19 VITALS — BP 137/86 | HR 88 | Wt 218.0 lb

## 2023-02-19 DIAGNOSIS — E119 Type 2 diabetes mellitus without complications: Secondary | ICD-10-CM | POA: Diagnosis not present

## 2023-02-19 DIAGNOSIS — E785 Hyperlipidemia, unspecified: Secondary | ICD-10-CM | POA: Diagnosis not present

## 2023-02-19 DIAGNOSIS — Z7984 Long term (current) use of oral hypoglycemic drugs: Secondary | ICD-10-CM

## 2023-02-19 LAB — POCT GLYCOSYLATED HEMOGLOBIN (HGB A1C): Hemoglobin A1C: 6.4 % — AB (ref 4.0–5.6)

## 2023-02-19 LAB — GLUCOSE, CAPILLARY: Glucose-Capillary: 100 mg/dL — ABNORMAL HIGH (ref 70–99)

## 2023-02-19 MED ORDER — METFORMIN HCL 500 MG PO TABS
500.0000 mg | ORAL_TABLET | Freq: Two times a day (BID) | ORAL | 3 refills | Status: DC
Start: 2023-02-19 — End: 2023-07-08
  Filled 2023-02-19: qty 60, 30d supply, fill #0
  Filled 2023-05-14: qty 60, 30d supply, fill #1
  Filled 2023-06-20: qty 60, 30d supply, fill #2

## 2023-02-19 NOTE — Patient Instructions (Signed)
Thank you, Ms.Rever Peta Peachey for allowing [place] to provide your care today.   Diabetes Your A1c looked great today! Keep up the good work. I am ok with you decreasing the dose of metformin to 500 mg daily but keep doing all the things up with exercise and diet. Come back in 3 months to check blood work. I placed referral for diabetic eye exam.  Cholesterol I will call with results  I have ordered the following labs for you:   Lab Orders         Microalbumin / Creatinine Urine Ratio         Glucose, capillary         Lipid Profile         POC Hbg A1C       Referrals ordered today:    Referral Orders         Ambulatory referral to Ophthalmology      I have ordered the following medication/changed the following medications:   Stop the following medications: Medications Discontinued During This Encounter  Medication Reason   azithromycin (ZITHROMAX) 250 MG tablet      Start the following medications: No orders of the defined types were placed in this encounter.    Follow up: 3 months   We look forward to seeing you next time. Please call our clinic at [phone] if you have any questions or concerns. The best time to call is Monday-Friday from 9am-4pm, but there is someone available 24/7. If after hours or the weekend, call the main hospital number and ask for the Internal Medicine Resident On-Call. If you need medication refills, please notify your pharmacy one week in advance and they will send [place] a request.   Thank you for trusting me with your care. Wishing you the best!   Rudene Christians, DO [facility]
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
## 2023-02-20 ENCOUNTER — Encounter: Payer: Self-pay | Admitting: Internal Medicine

## 2023-02-20 LAB — MICROALBUMIN / CREATININE URINE RATIO
Creatinine, Urine: 89.4 mg/dL
Microalb/Creat Ratio: 18 mg/g{creat} (ref 0–29)
Microalbumin, Urine: 16 ug/mL

## 2023-02-20 LAB — LIPID PANEL
Chol/HDL Ratio: 3.5 ratio (ref 0.0–4.4)
Cholesterol, Total: 157 mg/dL (ref 100–199)
HDL: 45 mg/dL (ref >39)
LDL Chol Calc (NIH): 94 mg/dL (ref 0–99)
Triglycerides: 99 mg/dL (ref 0–149)
VLDL Cholesterol Cal: 18 mg/dL (ref 5–40)

## 2023-02-20 NOTE — Assessment & Plan Note (Addendum)
A1c greatly improved from 8.6 to 6.4. She has been adherent with metformin 1000 mg every day but would like to come off of medication. P: Decrease metformin from 1000 mg every day to 500 mg every day Repeat lipid panel at goal for primary prevention Continue lipitor 40 mg If A1c remains <7 would be ok with stopping metformin Foot exam completed Urine microalbumin to creatinine ratio

## 2023-02-22 ENCOUNTER — Other Ambulatory Visit (HOSPITAL_COMMUNITY): Payer: Self-pay

## 2023-02-22 DIAGNOSIS — Z1231 Encounter for screening mammogram for malignant neoplasm of breast: Secondary | ICD-10-CM | POA: Diagnosis not present

## 2023-02-22 LAB — HM MAMMOGRAPHY

## 2023-02-24 NOTE — Progress Notes (Signed)
Internal Medicine Clinic Attending  Case discussed with the resident at the time of the visit.  We reviewed the resident's history and exam and pertinent patient test results.  I agree with the assessment, diagnosis, and plan of care documented in the resident's note.  

## 2023-02-25 ENCOUNTER — Other Ambulatory Visit (HOSPITAL_COMMUNITY): Payer: Self-pay

## 2023-02-28 ENCOUNTER — Other Ambulatory Visit (HOSPITAL_COMMUNITY): Payer: Self-pay
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
## 2023-03-07 ENCOUNTER — Other Ambulatory Visit (HOSPITAL_COMMUNITY): Payer: Self-pay

## 2023-03-08 ENCOUNTER — Other Ambulatory Visit (HOSPITAL_COMMUNITY): Payer: Self-pay

## 2023-04-03 ENCOUNTER — Other Ambulatory Visit (HOSPITAL_COMMUNITY): Payer: Self-pay

## 2023-04-03 ENCOUNTER — Other Ambulatory Visit: Payer: Self-pay | Admitting: Student

## 2023-04-03 ENCOUNTER — Other Ambulatory Visit: Payer: Self-pay | Admitting: Internal Medicine

## 2023-04-03 ENCOUNTER — Ambulatory Visit (INDEPENDENT_AMBULATORY_CARE_PROVIDER_SITE_OTHER): Payer: 59 | Admitting: Student

## 2023-04-03 VITALS — BP 119/66 | HR 81 | Temp 97.8°F | Ht 62.0 in | Wt 218.8 lb

## 2023-04-03 DIAGNOSIS — B9789 Other viral agents as the cause of diseases classified elsewhere: Secondary | ICD-10-CM | POA: Diagnosis not present

## 2023-04-03 DIAGNOSIS — E782 Mixed hyperlipidemia: Secondary | ICD-10-CM

## 2023-04-03 DIAGNOSIS — L0100 Impetigo, unspecified: Secondary | ICD-10-CM | POA: Diagnosis not present

## 2023-04-03 DIAGNOSIS — I10 Essential (primary) hypertension: Secondary | ICD-10-CM

## 2023-04-03 DIAGNOSIS — J069 Acute upper respiratory infection, unspecified: Secondary | ICD-10-CM | POA: Diagnosis not present

## 2023-04-03 DIAGNOSIS — Z7984 Long term (current) use of oral hypoglycemic drugs: Secondary | ICD-10-CM | POA: Diagnosis not present

## 2023-04-03 DIAGNOSIS — E119 Type 2 diabetes mellitus without complications: Secondary | ICD-10-CM | POA: Diagnosis not present

## 2023-04-03 MED ORDER — HYDROCHLOROTHIAZIDE 12.5 MG PO TABS
12.5000 mg | ORAL_TABLET | Freq: Every morning | ORAL | 3 refills | Status: DC
Start: 2023-04-03 — End: 2023-10-03
  Filled 2023-04-15 – 2023-05-08 (×2): qty 30, 30d supply, fill #0
  Filled 2023-06-10: qty 30, 30d supply, fill #1
  Filled 2023-07-10: qty 30, 30d supply, fill #2
  Filled 2023-08-01 – 2023-08-15 (×2): qty 30, 30d supply, fill #3
  Filled 2023-09-10: qty 30, 30d supply, fill #4

## 2023-04-03 MED ORDER — LOSARTAN POTASSIUM 50 MG PO TABS
50.0000 mg | ORAL_TABLET | Freq: Every day | ORAL | 3 refills | Status: DC
  Filled 2023-05-08: qty 30, 30d supply, fill #0
  Filled 2023-06-12: qty 30, 30d supply, fill #1
  Filled 2023-07-10: qty 30, 30d supply, fill #2
  Filled 2023-08-01 – 2023-08-12 (×2): qty 30, 30d supply, fill #3
  Filled 2023-09-10: qty 30, 30d supply, fill #4

## 2023-04-03 MED ORDER — GUAIFENESIN-CODEINE 100-10 MG/5ML PO SOLN
5.0000 mL | Freq: Three times a day (TID) | ORAL | 0 refills | Status: DC | PRN
  Filled 2023-04-03: qty 118, 8d supply, fill #0

## 2023-04-03 MED ORDER — MUPIROCIN 2 % EX OINT: TOPICAL_OINTMENT | Freq: Two times a day (BID) | CUTANEOUS | Status: DC
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
## 2023-04-03 NOTE — Patient Instructions (Addendum)
Thank you, [name] for allowing [place] to provide your care today. Today we discussed about your lesion on your nose  Impetigo, nose - Apply the mupirocin ointment at the area of the lesion, multiple times in a day - Do not pick on it.   2. Common Cold -You can take guaifenesin codeine for your cough as needed -I will call you and update you on the nasal swab,   3.  For your blood pressure -Continue taking hydrochlorothiazide and losartan as prescribed  4.  For diabetes -Continue taking metformin as prescribed.  I have ordered the following labs for you:  Lab Orders         COVID-19, Flu A+B and RSV       Tests ordered today:  RVP  Referrals ordered today:   Referral Orders  No referral(s) requested today     I have ordered the following medication/changed the following medications:   Stop the following medications: There are no discontinued medications.   Start the following medications: Meds ordered this encounter  Medications   losartan (COZAAR) 50 MG tablet    Sig: Take 1 tablet (50 mg total) by mouth daily.    Dispense:  90 tablet    Refill:  3   hydrochlorothiazide (HYDRODIURIL) 12.5 MG tablet    Sig: Take 1 tablet (12.5 mg total) by mouth in the morning.    Dispense:  90 tablet    Refill:  3   guaiFENesin-codeine 100-10 MG/5ML syrup    Sig: Take 5 mLs by mouth 3 (three) times daily as needed for cough.    Dispense:  118 mL    Refill:  0   mupirocin ointment (BACTROBAN) 2 %     Follow up: 2 months A1c    Remember:   Should you have any questions or concerns please call the internal medicine clinic at [phone].     Jeral Pinch, DO [facility]

## 2023-04-04 ENCOUNTER — Ambulatory Visit (INDEPENDENT_AMBULATORY_CARE_PROVIDER_SITE_OTHER): Payer: 59 | Admitting: Internal Medicine

## 2023-04-04 ENCOUNTER — Other Ambulatory Visit (HOSPITAL_COMMUNITY): Payer: Self-pay

## 2023-04-04 ENCOUNTER — Telehealth: Payer: Self-pay | Admitting: *Deleted

## 2023-04-04 VITALS — BP 137/77 | HR 93 | Temp 98.4°F | Ht 62.0 in | Wt 219.1 lb

## 2023-04-04 DIAGNOSIS — R8290 Unspecified abnormal findings in urine: Secondary | ICD-10-CM | POA: Diagnosis not present

## 2023-04-04 DIAGNOSIS — M1990 Unspecified osteoarthritis, unspecified site: Secondary | ICD-10-CM

## 2023-04-04 DIAGNOSIS — L0100 Impetigo, unspecified: Secondary | ICD-10-CM | POA: Insufficient documentation

## 2023-04-04 DIAGNOSIS — M170 Bilateral primary osteoarthritis of knee: Secondary | ICD-10-CM | POA: Diagnosis not present

## 2023-04-04 DIAGNOSIS — I10 Essential (primary) hypertension: Secondary | ICD-10-CM | POA: Diagnosis not present

## 2023-04-04 DIAGNOSIS — R300 Dysuria: Secondary | ICD-10-CM

## 2023-04-04 DIAGNOSIS — Z1231 Encounter for screening mammogram for malignant neoplasm of breast: Secondary | ICD-10-CM

## 2023-04-04 DIAGNOSIS — R350 Frequency of micturition: Secondary | ICD-10-CM

## 2023-04-04 LAB — POCT URINALYSIS DIPSTICK
Bilirubin, UA: NEGATIVE
Blood, UA: NEGATIVE
Glucose, UA: NEGATIVE
Ketones, UA: NEGATIVE
Nitrite, UA: NEGATIVE
Protein, UA: NEGATIVE
Spec Grav, UA: 1.02 (ref 1.010–1.025)
Urobilinogen, UA: 0.2 U/dL
pH, UA: 5 (ref 5.0–8.0)

## 2023-04-04 MED ORDER — DICLOFENAC SODIUM 1 % EX GEL
2.0000 g | Freq: Four times a day (QID) | CUTANEOUS | 0 refills | Status: DC
  Filled 2023-04-04: qty 100, 12d supply, fill #0

## 2023-04-04 MED ORDER — MUPIROCIN 2 % EX OINT
TOPICAL_OINTMENT | Freq: Three times a day (TID) | CUTANEOUS | 0 refills | Status: DC
  Filled 2023-04-04: qty 22, 10d supply, fill #0

## 2023-04-04 NOTE — Assessment & Plan Note (Signed)
Pt reports having lime green urine. States it has been going on for two days. She remembers drinking mellow yellow prior to this episode. She is denying any dysuria, increased frequency or other symptoms to suggest UTI. UA was done that showed mild leukocytes which are chronic for her, otherwise it was unremarkable. I visually inspected the urine and the color was light yellow. Gave pt a chart with different urine colors and told her it can vary with certain foods. I informed her red would be a concerning color for the urine as it can indicate blood in urine.

## 2023-04-04 NOTE — Telephone Encounter (Signed)
Call from patient states did not get the prescription called in for her nose. Patient also stated that she forgot to mention that she has lime green urine. Frequency with no pain.  Did not remember tot mention yesterday.

## 2023-04-04 NOTE — Assessment & Plan Note (Signed)
Patient reports she has a dry cough that started this past week Saturday with runny nose.  No fever or chills.  Does report some sore throat.  Reports her husband was sick last week.  He denies any nausea vomiting or diarrhea.  Per the physical exam, lungs are clear to auscultation.  She is afebrile here.  -Will follow-up on RVP panel today -Send refill on guaifenesin-codeine cough syrup

## 2023-04-04 NOTE — Assessment & Plan Note (Signed)
Advised to continue current regimen.

## 2023-04-04 NOTE — Patient Instructions (Signed)
Ms.Evalena Mae Stalder, it was a pleasure seeing you today! You endorsed feeling well today. Below are some of the things we talked about this visit. We look forward to seeing you in the follow up appointment!  Today we discussed: Your urine color is very normal. You are not having any symptoms that are concerning such as burning with urination, any increased in frequency, itching or drainage from the vagina. Continue to drink water.   I am sending in refills to your medications.   I have ordered the following labs today:   Lab Orders         POCT Urinalysis Dipstick (09811)       Referrals ordered today:   Referral Orders  No referral(s) requested today     I have ordered the following medication/changed the following medications:   Stop the following medications: Medications Discontinued During This Encounter  Medication Reason   mupirocin ointment (BACTROBAN) 2 %    losartan (COZAAR) 50 MG tablet Duplicate   hydrochlorothiazide (HYDRODIURIL) 12.5 MG tablet Duplicate   cyclobenzaprine (FLEXERIL) 5 MG tablet Patient has not taken in last 30 days   cyclobenzaprine (FLEXERIL) 5 MG tablet Patient has not taken in last 30 days   diclofenac Sodium (VOLTAREN ARTHRITIS PAIN) 1 % GEL Reorder     Start the following medications: Meds ordered this encounter  Medications   mupirocin ointment (BACTROBAN) 2 %    Sig: Apply topically 3 (three) times daily for 10 days.    Dispense:  22 g    Refill:  0   diclofenac Sodium (VOLTAREN ARTHRITIS PAIN) 1 % GEL    Sig: Apply 2 grams topically 4 (four) times daily.    Dispense:  100 g    Refill:  0     Follow-up: 3 months as recommended previously   Please make sure to arrive 15 minutes prior to your next appointment. If you arrive late, you may be asked to reschedule.   We look forward to seeing you next time. Please call our clinic at [phone] if you have any questions or concerns. The best time to call is Monday-Friday from 9am-4pm,  but there is someone available 24/7. If after hours or the weekend, call the main hospital number and ask for the Internal Medicine Resident On-Call. If you need medication refills, please notify your pharmacy one week in advance and they will send [place] a request.  Thank you for letting us take part in your care. Wishing you the best!  Thank you, Gwenevere Abbot, MD

## 2023-04-04 NOTE — Assessment & Plan Note (Signed)
Pt states she was able to get a mammogram last month and the results were normal. I asked the nursing staff to obtain the results.

## 2023-04-04 NOTE — Assessment & Plan Note (Signed)
Refilled patient's Voltaren gel. States knee pain is well controlled.

## 2023-04-04 NOTE — Assessment & Plan Note (Signed)
Lab Results  Component Value Date   HGBA1C 6.4 (A) 02/19/2023   Patient is currently on metformin 500 mg daily, reduced per the last office visit with consideration to come off this medication in future. A1c goal 7-8%, per the ACP guidelines. She is up to date on foot exam and ophthalmology exam. Urine ACR within range. LDL 94, at goal for primary prevention.   -Continue metformin 500 mg twice daily

## 2023-04-04 NOTE — Assessment & Plan Note (Addendum)
BP Readings from Last 3 Encounters:  04/03/23 119/66  02/19/23 137/86  01/31/23 129/71   Patient takes losartan 50 mg daily and hydrochlorothiazide 12.5 mg daily.  Refills are sent to the pharmacy.  Blood pressure is controlled at this visit.  No concerns at this time.   -Continue losartan 50 mg and hydrochlorothiazide 12.5 mg daily
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
## 2023-04-04 NOTE — Assessment & Plan Note (Signed)
Patient reports 2 months of lesion in her nose, states the lesion scabs over, she picks on it, and it is not healing.  Patient reports she has tried over-the-counter topical ointments and creams however the lesion keeps scabbing over and she keeps picking on it.  Per the physical exam she has yellow crusting at the entrance of the right nare.  Otherwise patient denies any fever or chills.  Plan: -Apply mupirocin ointment at the site BID -Patient was told to not pick at the scab

## 2023-04-05 LAB — COVID-19, FLU A+B AND RSV
Influenza A, NAA: NOT DETECTED
Influenza B, NAA: NOT DETECTED
RSV, NAA: NOT DETECTED
SARS-CoV-2, NAA: NOT DETECTED

## 2023-04-05 NOTE — Progress Notes (Signed)
Internal Medicine Clinic Attending  Case discussed with the resident at the time of the visit.  We reviewed the resident's history and exam and pertinent patient test results.  I agree with the assessment, diagnosis, and plan of care documented in the resident's note.  

## 2023-04-08 NOTE — Progress Notes (Signed)
Internal Medicine Clinic Attending  I was physically present during the key portions of the resident provided service and participated in the medical decision making of patient's management care. I reviewed pertinent patient test results.  The assessment, diagnosis, and plan were formulated together and I agree with the documentation in the resident's note.  Gust Rung, DO

## 2023-04-12 ENCOUNTER — Other Ambulatory Visit (HOSPITAL_COMMUNITY): Payer: Self-pay

## 2023-04-12 ENCOUNTER — Encounter (HOSPITAL_COMMUNITY): Payer: Self-pay

## 2023-04-12 MED ORDER — ATORVASTATIN CALCIUM 40 MG PO TABS
40.0000 mg | ORAL_TABLET | Freq: Every day | ORAL | 3 refills | Status: DC
  Filled 2023-04-12: qty 30, 30d supply, fill #0
  Filled 2023-05-13: qty 30, 30d supply, fill #1
  Filled 2023-06-10: qty 30, 30d supply, fill #2
  Filled 2023-08-01: qty 30, 30d supply, fill #3
  Filled 2023-09-10: qty 30, 30d supply, fill #4

## 2023-04-15 ENCOUNTER — Other Ambulatory Visit (HOSPITAL_COMMUNITY): Payer: Self-pay

## 2023-04-16 ENCOUNTER — Other Ambulatory Visit (HOSPITAL_COMMUNITY): Payer: Self-pay

## 2023-04-22 ENCOUNTER — Encounter (HOSPITAL_COMMUNITY): Payer: Self-pay

## 2023-04-22 ENCOUNTER — Other Ambulatory Visit (HOSPITAL_COMMUNITY): Payer: Self-pay

## 2023-04-22 ENCOUNTER — Other Ambulatory Visit: Payer: Self-pay | Admitting: Student

## 2023-04-22 DIAGNOSIS — M170 Bilateral primary osteoarthritis of knee: Secondary | ICD-10-CM

## 2023-04-22 NOTE — Telephone Encounter (Signed)
 Medication discontinued 03/14/23.

## 2023-04-23 ENCOUNTER — Other Ambulatory Visit: Payer: Self-pay | Admitting: Student

## 2023-04-23 ENCOUNTER — Other Ambulatory Visit (HOSPITAL_COMMUNITY): Payer: Self-pay

## 2023-04-23 ENCOUNTER — Telehealth: Payer: Self-pay

## 2023-04-23 MED ORDER — IBUPROFEN 600 MG PO TABS
600.0000 mg | ORAL_TABLET | Freq: Four times a day (QID) | ORAL | 0 refills | Status: AC | PRN
  Filled 2023-04-23: qty 20, 5d supply, fill #0

## 2023-04-23 NOTE — Telephone Encounter (Signed)
 Pt would like Rx for  ibuprofen (ADVIL) 600 MG tablet.Wanting to know if she can have this medication refill again.

## 2023-04-24 ENCOUNTER — Other Ambulatory Visit (HOSPITAL_COMMUNITY): Payer: Self-pay

## 2023-04-26 ENCOUNTER — Other Ambulatory Visit (HOSPITAL_COMMUNITY): Payer: Self-pay

## 2023-05-03 ENCOUNTER — Other Ambulatory Visit: Payer: Self-pay

## 2023-05-03 ENCOUNTER — Encounter (HOSPITAL_COMMUNITY): Payer: Self-pay | Admitting: Emergency Medicine

## 2023-05-03 ENCOUNTER — Emergency Department (HOSPITAL_COMMUNITY)
Admission: EM | Admit: 2023-05-03 | Discharge: 2023-05-04 | Disposition: A | Discharge status: Home / Self Care | Payer: 59 | Attending: Emergency Medicine | Admitting: Emergency Medicine

## 2023-05-03 ENCOUNTER — Emergency Department (HOSPITAL_COMMUNITY): Payer: 59

## 2023-05-03 DIAGNOSIS — Z20822 Contact with and (suspected) exposure to covid-19: Secondary | ICD-10-CM | POA: Insufficient documentation

## 2023-05-03 DIAGNOSIS — R059 Cough, unspecified: Secondary | ICD-10-CM | POA: Diagnosis not present

## 2023-05-03 DIAGNOSIS — J101 Influenza due to other identified influenza virus with other respiratory manifestations: Secondary | ICD-10-CM | POA: Insufficient documentation

## 2023-05-03 LAB — RESP PANEL BY RT-PCR (RSV, FLU A&B, COVID)  RVPGX2
Influenza A by PCR: POSITIVE — AB
Influenza B by PCR: NEGATIVE
Resp Syncytial Virus by PCR: NEGATIVE
SARS Coronavirus 2 by RT PCR: NEGATIVE

## 2023-05-03 MED ORDER — ALBUTEROL SULFATE HFA 108 (90 BASE) MCG/ACT IN AERS: 2.0000 | INHALATION_SPRAY | RESPIRATORY_TRACT | Status: DC | PRN

## 2023-05-03 NOTE — ED Triage Notes (Signed)
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 Pt reports some yellow sputum w/ some blood.

## 2023-05-04 MED ORDER — ACETAMINOPHEN 500 MG PO TABS
1000.0000 mg | ORAL_TABLET | Freq: Once | ORAL | Status: AC
  Administered 2023-05-04: 1000 mg via ORAL
  Filled 2023-05-04: qty 2

## 2023-05-04 MED ORDER — OSELTAMIVIR PHOSPHATE 75 MG PO CAPS
75.0000 mg | ORAL_CAPSULE | Freq: Two times a day (BID) | ORAL | 0 refills | Status: AC
  Filled 2023-05-04: qty 10, 5d supply, fill #0

## 2023-05-04 MED ORDER — OSELTAMIVIR PHOSPHATE 75 MG PO CAPS
75.0000 mg | ORAL_CAPSULE | Freq: Once | ORAL | Status: AC
  Administered 2023-05-04: 75 mg via ORAL
  Filled 2023-05-04: qty 1

## 2023-05-04 MED ORDER — BENZONATATE 100 MG PO CAPS
100.0000 mg | ORAL_CAPSULE | Freq: Three times a day (TID) | ORAL | 0 refills | Status: DC
  Filled 2023-05-04: qty 21, 7d supply, fill #0

## 2023-05-04 NOTE — ED Provider Notes (Signed)
[facility]ENC[facility] DEPT [facility] Emergency Department Provider Note MRN:  [id number]  Arrival date & time: 05/04/23     Chief Complaint   Cough   History of Present Illness   Danielle Mason is a 63 y.o. year-old female presents to the ED with chief complaint of generalized body aches and cough.  Onset of symptoms was yesterday.  She reports subjective fevers and chills.  Denies any successful treatments prior to arrival. She states that she also had some urinary incontinence when coughing.  History provided by patient.   Review of Systems  Pertinent positive and negative review of systems noted in HPI.    Physical Exam   Vitals:   05/03/23 2100 05/04/23 0038  BP: (!) 160/87 (!) 186/97  Pulse: (!) 111 (!) 108  Resp:  18  Temp:    SpO2: 97% 97%    CONSTITUTIONAL:  non toxic-appearing, NAD NEURO:  Alert and oriented x 3, CN 3-12 grossly intact EYES:  eyes equal and reactive ENT/NECK:  Supple, no stridor  CARDIO:  tachycardic, regular rhythm, appears well-perfused  PULM:  No respiratory distress, CTAB GI/GU:  non-distended,  MSK/SPINE:  No gross deformities, no edema, moves all extremities  SKIN:  no rash, atraumatic   *Additional and/or pertinent findings included in MDM below  Diagnostic and Interventional Summary    EKG Interpretation Date/Time:    Ventricular Rate:    PR Interval:    QRS Duration:    QT Interval:    QTC Calculation:   R Axis:      Text Interpretation:         Labs Reviewed  RESP PANEL BY RT-PCR (RSV, FLU A&B, COVID)  RVPGX2 - Abnormal; Notable for the following components:      Result Value   Influenza A by PCR POSITIVE (*)    All other components within normal limits    DG Chest 2 View  Final Result      Medications  albuterol (VENTOLIN HFA) 108 (90 Base) MCG/ACT inhaler 2 puff (has no administration in time range)  oseltamivir (TAMIFLU) capsule 75 mg (has no administration in time range)  acetaminophen (TYLENOL) tablet  1,000 mg (1,000 mg Oral Given 05/04/23 0042)     Procedures  /  Critical Care Procedures  ED Course and Medical Decision Making  I have reviewed the triage vital signs, the nursing notes, and pertinent available records from the EMR.  Social Determinants Affecting Complexity of Care: Patient has no clinically significant social determinants affecting this chief complaint..   ED Course:    Medical Decision Making Patient here with flulike symptoms.  Onset was yesterday.  Low-grade fever of 100 degrees.  Tachycardia thought secondary to fever.  Treated with Tylenol.  Influenza A test is positive.  Will treat with Tamiflu.  Chest x-ray negative for pneumonia.  She is not hypoxic or in any acute distress.  Feel the patient is stable for discharge and outpatient follow-up.  Amount and/or Complexity of Data Reviewed Radiology: ordered.  Risk OTC drugs. Prescription drug management.         Consultants: No consultations were needed in caring for this patient.   Treatment and Plan: Emergency department workup does not suggest an emergent condition requiring admission or immediate intervention beyond  what has been performed at this time. The patient is safe for discharge and has  been instructed to return immediately for worsening symptoms, change in  symptoms or any other concerns    Final Clinical Impressions(s) /  ED Diagnoses     ICD-10-CM   1. Influenza A  J10.1       ED Discharge Orders          Ordered    oseltamivir (TAMIFLU) 75 MG capsule  2 times daily        05/04/23 0051    benzonatate (TESSALON) 100 MG capsule  Every 8 hours        05/04/23 0051              Discharge Instructions Discussed with and Provided to Patient:   Discharge Instructions   None      Roxy Horseman, PA-C 05/04/23 0053    Palumbo, April, MD 05/04/23 [phone]

## 2023-05-06 ENCOUNTER — Other Ambulatory Visit (HOSPITAL_COMMUNITY): Payer: Self-pay

## 2023-05-07 ENCOUNTER — Other Ambulatory Visit (HOSPITAL_COMMUNITY): Payer: Self-pay

## 2023-05-08 ENCOUNTER — Other Ambulatory Visit (HOSPITAL_COMMUNITY): Payer: Self-pay

## 2023-05-10 ENCOUNTER — Other Ambulatory Visit (HOSPITAL_COMMUNITY): Payer: Self-pay

## 2023-05-13 ENCOUNTER — Other Ambulatory Visit: Payer: Self-pay | Admitting: Internal Medicine

## 2023-05-13 ENCOUNTER — Other Ambulatory Visit (HOSPITAL_COMMUNITY): Payer: Self-pay

## 2023-05-14 ENCOUNTER — Telehealth: Payer: Self-pay | Admitting: *Deleted

## 2023-05-14 ENCOUNTER — Other Ambulatory Visit (HOSPITAL_COMMUNITY): Payer: Self-pay

## 2023-05-14 MED ORDER — MUPIROCIN 2 % EX OINT
TOPICAL_OINTMENT | Freq: Three times a day (TID) | CUTANEOUS | 3 refills | Status: AC
  Filled 2023-05-14: qty 22, 10d supply, fill #0
  Filled 2023-08-01: qty 22, 22d supply, fill #1
  Filled 2023-10-25: qty 22, 22d supply, fill #2

## 2023-05-14 NOTE — Progress Notes (Signed)
Transition Care Management Unsuccessful Follow-up Telephone Call  Date of discharge and from where:  [facility]  05/04/2023  Attempts:  1st Attempt  Reason for unsuccessful TCM follow-up call:  No answer/busy

## 2023-05-14 NOTE — Telephone Encounter (Signed)
Medication sent to pharmacy

## 2023-05-25 ENCOUNTER — Other Ambulatory Visit: Payer: Self-pay

## 2023-05-25 ENCOUNTER — Encounter (HOSPITAL_COMMUNITY): Payer: Self-pay

## 2023-05-25 ENCOUNTER — Emergency Department (HOSPITAL_COMMUNITY)
Admission: EM | Admit: 2023-05-25 | Discharge: 2023-05-25 | Disposition: A | Discharge status: Home / Self Care | Payer: Self-pay | Attending: Emergency Medicine | Admitting: Emergency Medicine

## 2023-05-25 DIAGNOSIS — I10 Essential (primary) hypertension: Secondary | ICD-10-CM | POA: Diagnosis not present

## 2023-05-25 DIAGNOSIS — Z7984 Long term (current) use of oral hypoglycemic drugs: Secondary | ICD-10-CM | POA: Diagnosis not present

## 2023-05-25 DIAGNOSIS — E119 Type 2 diabetes mellitus without complications: Secondary | ICD-10-CM | POA: Diagnosis not present

## 2023-05-25 DIAGNOSIS — Z91040 Latex allergy status: Secondary | ICD-10-CM | POA: Insufficient documentation

## 2023-05-25 DIAGNOSIS — H5711 Ocular pain, right eye: Secondary | ICD-10-CM | POA: Diagnosis present

## 2023-05-25 DIAGNOSIS — Z79899 Other long term (current) drug therapy: Secondary | ICD-10-CM | POA: Diagnosis not present

## 2023-05-25 MED ORDER — ACETAMINOPHEN 500 MG PO TABS
1000.0000 mg | ORAL_TABLET | Freq: Once | ORAL | Status: AC
  Administered 2023-05-25: 1000 mg via ORAL
  Filled 2023-05-25: qty 2

## 2023-05-25 MED ORDER — TETRACAINE HCL 0.5 % OP SOLN
2.0000 [drp] | Freq: Once | OPHTHALMIC | Status: AC
  Administered 2023-05-25: 2 [drp] via OPHTHALMIC
  Filled 2023-05-25: qty 4

## 2023-05-25 MED ORDER — FLUORESCEIN SODIUM 1 MG OP STRP
1.0000 | ORAL_STRIP | Freq: Once | OPHTHALMIC | Status: AC
  Administered 2023-05-25: 1 via OPHTHALMIC
  Filled 2023-05-25: qty 1

## 2023-05-25 NOTE — ED Provider Notes (Signed)
 [facility] EMERGENCY DEPARTMENT AT [facility] Provider Note   CSN: [id number] Arrival date & time: 05/25/23  1734     History  Chief Complaint  Patient presents with   Eye Pain   HPI Danielle Mason is a 63 y.o. female with hypertension, hyperlipidemia, diabetes presenting for right eye pain.  Started about 5 days ago.  Pain is intermittent and all about the right nare.  At this moment she does not have pain.  Denies visual disturbance.  Denies pain with eye movement.  It is a sharp pain.  States she has follow-up with ophthalmology at [facility] who she sees for regularly.  Denies any discharge from the eye.  Denies fever or chills.  Denies foreign body sensation.   Eye Pain       Home Medications Prior to Admission medications   Medication Sig Start Date End Date Taking? Authorizing Provider  acetaminophen  (TYLENOL ) 325 MG tablet Take 325 mg by mouth every 6 (six) hours as needed for mild pain or headache.    [provider]  atorvastatin  (LIPITOR) 40 MG tablet Take 1 tablet (40 mg total) by mouth daily. 04/12/23   Tawkaliyar, Roya, DO  benzonatate  (TESSALON ) 100 MG capsule Take 1 capsule (100 mg total) by mouth every 8 (eight) hours. 05/04/23   Vicky Charleston, PA-C  blood glucose meter kit and supplies KIT Use up to four times daily as directed. 11/03/22   Rancour, Garnette, MD  clindamycin  (CLEOCIN  T) 1 % lotion Apply topically 2 (two) times daily. 09/28/22   Nooruddin, Saad, MD  diclofenac  Sodium (VOLTAREN  ARTHRITIS PAIN) 1 % GEL Apply 2 grams topically 4 (four) times daily. 04/04/23   Fernand Prost, MD  erythromycin  ophthalmic ointment Place 0.5 inch ribbon to the right eye 2 (two) times daily for 10 days. 02/18/23     fluticasone  (FLONASE ) 50 MCG/ACT nasal spray Place 1 spray into both nostrils daily. 04/12/22   Jerrell Cleatus Ned, MD  guaiFENesin -codeine  100-10 MG/5ML syrup Take 5 mLs by mouth 3 (three) times daily as needed for cough. 04/03/23    Tawkaliyar, Roya, DO  hydrochlorothiazide  (HYDRODIURIL ) 12.5 MG tablet Take 1 tablet (12.5 mg total) by mouth in the morning. 04/03/23   Tawkaliyar, Roya, DO  losartan  (COZAAR ) 50 MG tablet Take 1 tablet (50 mg total) by mouth daily. 04/03/23   Tawkaliyar, Roya, DO  metFORMIN  (GLUCOPHAGE ) 500 MG tablet Take 1 tablet (500 mg total) by mouth 2 (two) times daily with a meal. 02/19/23   Masters, Katie, DO  dicyclomine  (BENTYL ) 20 MG tablet Take 1 tablet (20 mg total) by mouth 2 (two) times daily. Patient not taking: No sig reported 04/12/20 06/27/20  Harris, Abigail, PA-C      Allergies    Eicosapentaenoic acid (epa) (fish), Fish-derived products, Penicillins, and Latex    Review of Systems   Review of Systems  Eyes:  Positive for pain.    Physical Exam Updated Vital Signs BP (!) 141/105   Pulse 91   Temp 98.2 F (36.8 C) (Oral)   Resp 18   Ht 5' 2 (1.575 m)   Wt 99.8 kg   SpO2 100%   BMI 40.24 kg/m  Physical Exam Constitutional:      Appearance: Normal appearance.  HENT:     Head: Normocephalic.     Nose: Nose normal.  Eyes:     General: Lids are normal. Lids are everted, no foreign bodies appreciated.        Right eye: No  foreign body.        Left eye: No foreign body.     Intraocular pressure: Right eye pressure is 27 mmHg. Left eye pressure is 35 mmHg. Measurements were taken using a handheld tonometer.    Extraocular Movements: Extraocular movements intact.     Conjunctiva/sclera: Conjunctivae normal.     Right eye: Right conjunctiva is not injected. No chemosis, exudate or hemorrhage.    Left eye: Left conjunctiva is not injected. No chemosis, exudate or hemorrhage.    Pupils: Pupils are equal, round, and reactive to light.     Right eye: No corneal abrasion or fluorescein  uptake. Seidel exam negative.  Pulmonary:     Effort: Pulmonary effort is normal.  Neurological:     Mental Status: She is alert.  Psychiatric:        Mood and Affect: Mood normal.     ED  Results / Procedures / Treatments   Labs (all labs ordered are listed, but only abnormal results are displayed) Labs Reviewed - No data to display  EKG None  Radiology No results found.  Procedures Procedures    Medications Ordered in ED Medications  fluorescein  ophthalmic strip 1 strip (1 strip Right Eye Given by Other 05/25/23 2016)  tetracaine  (PONTOCAINE) 0.5 % ophthalmic solution 2 drop (2 drops Right Eye Given by Other 05/25/23 2016)  acetaminophen  (TYLENOL ) tablet 1,000 mg (1,000 mg Oral Given 05/25/23 2016)    ED Course/ Medical Decision Making/ A&P                                 Medical Decision Making Risk OTC drugs. Prescription drug management.   63 year old well-appearing female presenting for eye pain.  Exam of the eye was mostly reassuring but pressures were notably elevated in both eyes.  Considered glaucoma and acute angle closure but she is asymptomatic at this time and vitals are stable.  She does have close follow-up with ophthalmology in the [facility] system.  Reviewed her chart and saw that at her last visit her pressures were closer to normal.  States at this time that she is safe to go home with certainly needs close follow-up with her ophthalmologist.  Advised her to schedule an appointment as soon as possible.  Discussed strict return precautions.  Discharged good condition.        Final Clinical Impression(s) / ED Diagnoses Final diagnoses:  Eye pain, right    Rx / DC Orders ED Discharge Orders     None         Lang Norleen POUR, PA-C 05/25/23 2101    Randol Simmonds, MD 05/28/23 [phone]

## 2023-05-25 NOTE — Discharge Instructions (Addendum)
 Evaluation today was overall reassuring.  Your eye pressures were notably elevated.  At this time I feel that you should follow-up with your eye doctor as soon as possible.  If your symptoms worsen in any way please return to the emergency department further evaluation this also includes vision loss as well or pain with movement of the eye.

## 2023-05-25 NOTE — ED Triage Notes (Signed)
 Pt reports with sharp intermittent right eye pain since this past week.

## 2023-06-10 ENCOUNTER — Other Ambulatory Visit (HOSPITAL_COMMUNITY): Payer: Self-pay

## 2023-06-11 ENCOUNTER — Other Ambulatory Visit (HOSPITAL_COMMUNITY): Payer: Self-pay

## 2023-06-12 ENCOUNTER — Other Ambulatory Visit (HOSPITAL_COMMUNITY): Payer: Self-pay

## 2023-06-20 ENCOUNTER — Other Ambulatory Visit (HOSPITAL_COMMUNITY): Payer: Self-pay

## 2023-07-03 ENCOUNTER — Encounter: Payer: 59 | Admitting: Student

## 2023-07-08 ENCOUNTER — Ambulatory Visit: Admitting: Student

## 2023-07-08 VITALS — BP 126/72 | HR 79 | Temp 97.8°F | Ht 62.0 in | Wt 218.7 lb

## 2023-07-08 DIAGNOSIS — I10 Essential (primary) hypertension: Secondary | ICD-10-CM | POA: Diagnosis not present

## 2023-07-08 DIAGNOSIS — E119 Type 2 diabetes mellitus without complications: Secondary | ICD-10-CM

## 2023-07-08 LAB — POCT GLYCOSYLATED HEMOGLOBIN (HGB A1C): Hemoglobin A1C: 6.3 % — AB (ref 4.0–5.6)

## 2023-07-08 LAB — GLUCOSE, CAPILLARY: Glucose-Capillary: 110 mg/dL — ABNORMAL HIGH (ref 70–99)

## 2023-07-08 NOTE — Assessment & Plan Note (Signed)
 Current medication regimen includes metformin 500 mg BID and atorvastatin 40 mg daily. Last A1c in 02/2023 decreased from 8.6 to 6.4. Today, A1c is . At her visit in 02/2023, it was discussed that she may discontinue her metformin if her A1c is less than 7.0. After further discussion today, patient hopes to discontinue this medication. Given in context of her A1c, I feel it is appropriate to discontinue this medication at this time and manage her diabetes with lifestyle modifications and annual A1c checks.  - Discontinue metformin 500 mg BID

## 2023-07-08 NOTE — Assessment & Plan Note (Deleted)
 Current medication regimen includes metformin 500 mg BID and atorvastatin 40 mg daily. Last A1c in 02/2023 decreased from 8.6 to 6.4. Today, A1c is . At her visit in 02/2023, it was discussed that she may discontinue her metformin if her A1c is less than 70.  Current medication includes atorvastatin 40 mg daily. Last lipid panel in 02/2023 notable for LDL of 94.  - Continue atorvastatin 40 mg daily  - Continue metformin 500 mg BID

## 2023-07-08 NOTE — Progress Notes (Signed)
 Internal Medicine Clinic Attending  Case discussed with the resident at the time of the visit.  We reviewed the resident's history and exam and pertinent patient test results.  I agree with the assessment, diagnosis, and plan of care documented in the resident's note.

## 2023-07-08 NOTE — Assessment & Plan Note (Addendum)
 Current medications include losartan 50 mg daily and hydrochlorothiazide 12.5 mg daily. Today in the office, blood pressure is 126/72. Last BMP in 10/2022 notable for mild hyponatremia of 134. Will aim to check BMP at her next follow-up visit  - Continue losartan 50 mg daily and hydrochlorothiazide 12.5 mg daily  - Recheck BMP at next visit
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
## 2023-07-08 NOTE — Patient Instructions (Signed)
 Thank you so much for coming to the clinic today!   You can stop taking the metformin.  I have sent in a referral for an ophthalmology exam.   We will see you in 6 months.   If you have any questions please feel free to the call the clinic at anytime at [phone]. It was a pleasure seeing you!  Best, Dr. Rayvon Char

## 2023-07-10 ENCOUNTER — Other Ambulatory Visit (HOSPITAL_COMMUNITY): Payer: Self-pay

## 2023-08-01 ENCOUNTER — Other Ambulatory Visit (HOSPITAL_COMMUNITY): Payer: Self-pay

## 2023-08-12 ENCOUNTER — Other Ambulatory Visit (HOSPITAL_COMMUNITY): Payer: Self-pay

## 2023-08-15 ENCOUNTER — Other Ambulatory Visit (HOSPITAL_COMMUNITY): Payer: Self-pay

## 2023-08-26 LAB — HM DIABETES EYE EXAM

## 2023-09-10 ENCOUNTER — Other Ambulatory Visit (HOSPITAL_COMMUNITY): Payer: Self-pay

## 2023-09-27 ENCOUNTER — Other Ambulatory Visit (HOSPITAL_COMMUNITY): Payer: Self-pay

## 2023-09-27 MED ORDER — TOBRAMYCIN-DEXAMETHASONE 0.3-0.1 % OP SUSP
1.0000 [drp] | Freq: Four times a day (QID) | OPHTHALMIC | 0 refills | Status: AC
  Filled 2023-09-27: qty 5, 25d supply, fill #0

## 2023-10-02 ENCOUNTER — Ambulatory Visit: Payer: Self-pay | Admitting: Student

## 2023-10-02 NOTE — Telephone Encounter (Signed)
 Patient is scheduled to come in 10/15/23 with pcp.

## 2023-10-02 NOTE — Telephone Encounter (Signed)
 Copied from CRM [id number]. Topic: Appointments - Scheduling Inquiry for Clinic >> Oct 02, 2023 12:03 PM Magdalene School wrote: Reason for CRM: Patient is having Sneezing and snorting, scratchy ears and throat. She would like sooner appointment than 10/15/23.

## 2023-10-03 ENCOUNTER — Other Ambulatory Visit (HOSPITAL_COMMUNITY): Payer: Self-pay

## 2023-10-03 ENCOUNTER — Ambulatory Visit: Payer: Self-pay | Admitting: Internal Medicine

## 2023-10-03 ENCOUNTER — Other Ambulatory Visit: Payer: Self-pay

## 2023-10-03 VITALS — BP 128/80 | HR 88 | Temp 98.1°F | Ht 62.0 in | Wt 215.6 lb

## 2023-10-03 DIAGNOSIS — M1990 Unspecified osteoarthritis, unspecified site: Secondary | ICD-10-CM

## 2023-10-03 DIAGNOSIS — I10 Essential (primary) hypertension: Secondary | ICD-10-CM | POA: Diagnosis not present

## 2023-10-03 DIAGNOSIS — E785 Hyperlipidemia, unspecified: Secondary | ICD-10-CM

## 2023-10-03 DIAGNOSIS — J302 Other seasonal allergic rhinitis: Secondary | ICD-10-CM | POA: Diagnosis not present

## 2023-10-03 DIAGNOSIS — E782 Mixed hyperlipidemia: Secondary | ICD-10-CM

## 2023-10-03 MED ORDER — LOSARTAN POTASSIUM 50 MG PO TABS
50.0000 mg | ORAL_TABLET | Freq: Every day | ORAL | 3 refills | Status: AC
  Filled 2023-10-03 – 2023-10-04 (×2): qty 90, 90d supply, fill #0
  Filled 2023-12-13 – 2023-12-16 (×2): qty 90, 90d supply, fill #1
  Filled 2023-12-17: qty 30, 30d supply, fill #1
  Filled 2024-01-20: qty 90, 90d supply, fill #1
  Filled 2024-04-23: qty 90, 90d supply, fill #2
  Filled ????-??-??: fill #1

## 2023-10-03 MED ORDER — FLUTICASONE PROPIONATE 50 MCG/ACT NA SUSP
1.0000 | Freq: Every day | NASAL | 2 refills | Status: AC
  Filled 2023-10-03: qty 16, 60d supply, fill #0
  Filled 2023-12-13 – 2024-02-18 (×3): qty 16, 60d supply, fill #1

## 2023-10-03 MED ORDER — DICLOFENAC SODIUM 1 % EX GEL
2.0000 g | Freq: Four times a day (QID) | CUTANEOUS | 0 refills | Status: AC
  Filled 2023-10-03: qty 100, 12d supply, fill #0

## 2023-10-03 MED ORDER — CETIRIZINE HCL 10 MG PO TABS
10.0000 mg | ORAL_TABLET | Freq: Every day | ORAL | 2 refills | Status: DC
  Filled 2023-10-03: qty 30, 30d supply, fill #0
  Filled 2023-11-08: qty 30, 30d supply, fill #1

## 2023-10-03 MED ORDER — ATORVASTATIN CALCIUM 40 MG PO TABS
40.0000 mg | ORAL_TABLET | Freq: Every day | ORAL | 3 refills | Status: DC
  Filled 2023-10-03 – 2023-10-04 (×2): qty 90, 90d supply, fill #0
  Filled 2023-12-13 – 2023-12-16 (×2): qty 90, 90d supply, fill #1
  Filled 2023-12-17: qty 30, 30d supply, fill #1
  Filled 2024-01-16: qty 90, 90d supply, fill #1
  Filled ????-??-??: fill #1

## 2023-10-03 MED ORDER — HYDROCHLOROTHIAZIDE 12.5 MG PO TABS
12.5000 mg | ORAL_TABLET | Freq: Every morning | ORAL | 3 refills | Status: AC
  Filled 2023-10-03 – 2023-10-04 (×2): qty 90, 90d supply, fill #0
  Filled 2023-12-13 – 2023-12-16 (×2): qty 90, 90d supply, fill #1
  Filled 2023-12-17: qty 30, 30d supply, fill #1
  Filled 2024-01-16: qty 90, 90d supply, fill #1
  Filled 2024-04-03: qty 90, 90d supply, fill #2
  Filled ????-??-??: fill #1

## 2023-10-03 NOTE — Progress Notes (Signed)
 Patient name: Danielle Mason Date of birth: 01/18/1961 Date of visit: 10/03/23  Subjective   Chief concern: sneezing, runny nose, nasal congestion  Ongoing for couple weeks.  Had this in the past and took some Zyrtec  to good effect.  Review of Systems  Constitutional:  Negative for fever.  Respiratory:  Negative for shortness of breath.     Current Outpatient Medications  Medication Instructions   acetaminophen  (TYLENOL ) 325 mg, Oral, Every 6 hours PRN   atorvastatin  (LIPITOR) 40 mg, Oral, Daily   benzonatate  (TESSALON ) 100 mg, Oral, Every 8 hours   blood glucose meter kit and supplies KIT Use up to four times daily as directed.   cetirizine  (ZYRTEC  ALLERGY) 10 mg, Oral, Daily   clindamycin  (CLEOCIN  T) 1 % lotion Topical, 2 times daily   diclofenac  Sodium (VOLTAREN  ARTHRITIS PAIN) 1 % GEL Apply 2 grams topically 4 (four) times daily.   erythromycin  ophthalmic ointment Place 0.5 inch ribbon to the right eye 2 (two) times daily for 10 days.   fluticasone  (FLONASE ) 50 MCG/ACT nasal spray 1 spray, Each Nare, Daily   guaiFENesin -codeine  100-10 MG/5ML syrup 5 mLs, Oral, 3 times daily PRN   hydrochlorothiazide  (HYDRODIURIL ) 12.5 mg, Oral, Every morning   losartan  (COZAAR ) 50 mg, Oral, Daily   tobramycin -dexamethasone  (TOBRADEX ) ophthalmic solution Shake drop well and place 1 drop into both eyes 4 (four) times daily for 7 days.     Objective  Today's Vitals   10/03/23 0902  BP: 128/80  Pulse: 88  Temp: 98.1 F (36.7 C)  TempSrc: Oral  SpO2: 98%  Weight: 215 lb 9.6 oz (97.8 kg)  Height: 5' 2 (1.575 m)  PainSc: 0-No pain  Body mass index is 39.43 kg/m.   Physical Exam Constitutional:      General: She is not in acute distress.    Appearance: Normal appearance.  HENT:     Right Ear: Tympanic membrane, ear canal and external ear normal.     Left Ear: Tympanic membrane, ear canal and external ear normal.     Mouth/Throat:     Mouth: Mucous membranes are moist.      Pharynx: No oropharyngeal exudate or posterior oropharyngeal erythema.   Cardiovascular:     Rate and Rhythm: Normal rate and regular rhythm.     Pulses: Normal pulses.     Heart sounds: No murmur heard. Pulmonary:     Effort: Pulmonary effort is normal.     Breath sounds: Normal breath sounds. No wheezing or rales.  Abdominal:     Palpations: There is no hepatomegaly or splenomegaly.   Musculoskeletal:     Right lower leg: No edema.     Left lower leg: No edema.   Skin:    General: Skin is warm and dry.   Neurological:     Mental Status: She is alert. Mental status is at baseline.     Cranial Nerves: No facial asymmetry.     Motor: No tremor.   Psychiatric:        Mood and Affect: Mood normal.        Behavior: Behavior normal.      Assessment & Plan  Problem List Items Addressed This Visit     Essential hypertension (Chronic)   128/80 on HCTZ 12.5 mg and losartan  50 mg.  BMP today.  Medications refilled.      Relevant Medications   losartan  (COZAAR ) 50 MG tablet   hydrochlorothiazide  (HYDRODIURIL ) 12.5 MG tablet   atorvastatin  (LIPITOR) 40 MG  tablet   Other Relevant Orders   BMP8+Anion Gap   Hyperlipidemia   Refill atorvastatin  40 mg.      Relevant Medications   losartan  (COZAAR ) 50 MG tablet   hydrochlorothiazide  (HYDRODIURIL ) 12.5 MG tablet   atorvastatin  (LIPITOR) 40 MG tablet   Allergic rhinitis - Primary   Start Zyrtec  10 mg daily, fluticasone  1 spray per nostril daily, nasal saline irrigation.      Relevant Medications   cetirizine  (ZYRTEC  ALLERGY) 10 MG tablet   fluticasone  (FLONASE ) 50 MCG/ACT nasal spray   Other Visit Diagnoses       Osteoarthritis, unspecified osteoarthritis type, unspecified site       Relevant Medications   diclofenac  Sodium (VOLTAREN  ARTHRITIS PAIN) 1 % GEL      Return in about 6 months (around 04/03/2024) for routine follow-up.  Adria Hopkins MD 10/03/2023, 9:32 AM

## 2023-10-03 NOTE — Assessment & Plan Note (Signed)
 128/80 on HCTZ 12.5 mg and losartan  50 mg.  BMP today.  Medications refilled.

## 2023-10-03 NOTE — Assessment & Plan Note (Signed)
 Start Zyrtec  10 mg daily, fluticasone  1 spray per nostril daily, nasal saline irrigation.

## 2023-10-03 NOTE — Assessment & Plan Note (Signed)
Refill atorvastatin 40 mg

## 2023-10-03 NOTE — Patient Instructions (Signed)
 Return in about 6 months (around 04/03/2024) for routine follow-up.  Remember to bring all of the medications that you take (including over the counter medications and supplements) with you to every clinic visit.  This after visit summary is an important review of tests, referrals, and medication changes that were discussed during your visit. If you have questions or concerns, call [phone]. Outside of clinic business hours, call the main hospital at [phone] and ask the operator for the on-call internal medicine resident.   Adria Hopkins MD 10/03/2023, 9:16 AM

## 2023-10-04 ENCOUNTER — Other Ambulatory Visit (HOSPITAL_COMMUNITY): Payer: Self-pay

## 2023-10-04 ENCOUNTER — Other Ambulatory Visit: Payer: Self-pay

## 2023-10-04 LAB — BMP8+ANION GAP
Anion Gap: 16 mmol/L (ref 10.0–18.0)
BUN/Creatinine Ratio: 23 (ref 12–28)
BUN: 17 mg/dL (ref 8–27)
CO2: 19 mmol/L — ABNORMAL LOW (ref 20–29)
Calcium: 9.9 mg/dL (ref 8.7–10.3)
Chloride: 104 mmol/L (ref 96–106)
Creatinine, Ser: 0.75 mg/dL (ref 0.57–1.00)
Glucose: 185 mg/dL — ABNORMAL HIGH (ref 70–99)
Potassium: 4.3 mmol/L (ref 3.5–5.2)
Sodium: 139 mmol/L (ref 134–144)
eGFR: 89 mL/min/{1.73_m2} (ref >59)

## 2023-10-04 MED ORDER — ERYTHROMYCIN 5 MG/GM OP OINT
1.0000 | TOPICAL_OINTMENT | Freq: Every day | OPHTHALMIC | 1 refills | Status: DC
  Filled 2023-10-04: qty 3.5, 3d supply, fill #0
  Filled 2023-10-29: qty 3.5, 3d supply, fill #1

## 2023-10-07 ENCOUNTER — Ambulatory Visit: Payer: Self-pay | Admitting: Student

## 2023-10-07 NOTE — Progress Notes (Signed)
 Internal Medicine Clinic Attending  Case discussed with the resident at the time of the visit.  We reviewed the resident's history and exam and pertinent patient test results.  I agree with the assessment, diagnosis, and plan of care documented in the resident's note.

## 2023-10-08 ENCOUNTER — Other Ambulatory Visit (HOSPITAL_COMMUNITY): Payer: Self-pay

## 2023-10-14 ENCOUNTER — Other Ambulatory Visit (HOSPITAL_COMMUNITY): Payer: Self-pay

## 2023-10-15 ENCOUNTER — Ambulatory Visit: Payer: Self-pay | Admitting: Student

## 2023-10-25 ENCOUNTER — Other Ambulatory Visit (HOSPITAL_COMMUNITY): Payer: Self-pay

## 2023-10-29 ENCOUNTER — Other Ambulatory Visit (HOSPITAL_COMMUNITY): Payer: Self-pay

## 2023-10-30 ENCOUNTER — Other Ambulatory Visit (HOSPITAL_COMMUNITY): Payer: Self-pay

## 2023-11-01 ENCOUNTER — Other Ambulatory Visit (HOSPITAL_COMMUNITY): Payer: Self-pay

## 2023-11-08 ENCOUNTER — Other Ambulatory Visit (HOSPITAL_COMMUNITY): Payer: Self-pay

## 2023-11-15 ENCOUNTER — Other Ambulatory Visit (HOSPITAL_COMMUNITY): Payer: Self-pay

## 2023-11-15 MED ORDER — ERYTHROMYCIN 5 MG/GM OP OINT
TOPICAL_OINTMENT | OPHTHALMIC | 3 refills | Status: AC
  Filled 2023-11-15 – 2023-12-17 (×2): qty 3.5, 7d supply, fill #0

## 2023-11-17 ENCOUNTER — Other Ambulatory Visit: Payer: Self-pay

## 2023-11-17 ENCOUNTER — Encounter (HOSPITAL_COMMUNITY): Payer: Self-pay

## 2023-11-17 ENCOUNTER — Emergency Department (HOSPITAL_COMMUNITY)
Admission: EM | Admit: 2023-11-17 | Discharge: 2023-11-17 | Disposition: A | Discharge status: Home / Self Care | Payer: Self-pay | Attending: Emergency Medicine | Admitting: Emergency Medicine

## 2023-11-17 DIAGNOSIS — Z91040 Latex allergy status: Secondary | ICD-10-CM | POA: Diagnosis not present

## 2023-11-17 DIAGNOSIS — E1165 Type 2 diabetes mellitus with hyperglycemia: Secondary | ICD-10-CM | POA: Insufficient documentation

## 2023-11-17 DIAGNOSIS — R739 Hyperglycemia, unspecified: Secondary | ICD-10-CM

## 2023-11-17 LAB — CBG MONITORING, ED: Glucose-Capillary: 308 mg/dL — ABNORMAL HIGH (ref 70–99)

## 2023-11-17 LAB — I-STAT CHEM 8, ED
BUN: 23 mg/dL (ref 8–23)
Calcium, Ion: 1.16 mmol/L (ref 1.15–1.40)
Chloride: 103 mmol/L (ref 98–111)
Creatinine, Ser: 1.3 mg/dL — ABNORMAL HIGH (ref 0.44–1.00)
Glucose, Bld: 330 mg/dL — ABNORMAL HIGH (ref 70–99)
HCT: 37 % (ref 36.0–46.0)
Hemoglobin: 12.6 g/dL (ref 12.0–15.0)
Potassium: 4.9 mmol/L (ref 3.5–5.1)
Sodium: 137 mmol/L (ref 135–145)
TCO2: 27 mmol/L (ref 22–32)

## 2023-11-17 LAB — CBC
HCT: 38 % (ref 36.0–46.0)
Hemoglobin: 12.1 g/dL (ref 12.0–15.0)
MCH: 27.5 pg (ref 26.0–34.0)
MCHC: 31.8 g/dL (ref 30.0–36.0)
MCV: 86.4 fL (ref 80.0–100.0)
Platelets: 263 K/uL (ref 150–400)
RBC: 4.4 MIL/uL (ref 3.87–5.11)
RDW: 13.2 % (ref 11.5–15.5)
WBC: 10.2 K/uL (ref 4.0–10.5)
nRBC: 0 % (ref 0.0–0.2)

## 2023-11-17 LAB — URINALYSIS, ROUTINE W REFLEX MICROSCOPIC
Bilirubin Urine: NEGATIVE
Glucose, UA: 50 mg/dL — AB
Hgb urine dipstick: NEGATIVE
Ketones, ur: NEGATIVE mg/dL
Nitrite: NEGATIVE
Protein, ur: NEGATIVE mg/dL
Specific Gravity, Urine: 1.024 (ref 1.005–1.030)
pH: 5 (ref 5.0–8.0)

## 2023-11-17 NOTE — ED Triage Notes (Signed)
 Pt complaining of high blood sugar that started tonight. Used to take metformin  but is not taking anymore.

## 2023-11-17 NOTE — ED Provider Notes (Signed)
 [facility] Provider Note   CSN: [id number] Arrival date & time: 11/17/23  1946     Patient presents with: Hyperglycemia   Danielle Mason is a 63 y.o. female.   HPI Patient presents with concern for hyperglycemia.  She is otherwise asymptomatic, denies complaints, pain, discomfort. She was discharged today to check her sugar found to be greater than 400.  She notes that she was previously on metformin  has no interest in taking this medication again.    Prior to Admission medications   Medication Sig Start Date End Date Taking? Authorizing Provider  acetaminophen  (TYLENOL ) 325 MG tablet Take 325 mg by mouth every 6 (six) hours as needed for mild pain or headache.    [provider]  atorvastatin  (LIPITOR) 40 MG tablet Take 1 tablet (40 mg total) by mouth daily. 10/03/23   Norrine Sharper, MD  benzonatate  (TESSALON ) 100 MG capsule Take 1 capsule (100 mg total) by mouth every 8 (eight) hours. 05/04/23   Vicky Charleston, PA-C  blood glucose meter kit and supplies KIT Use up to four times daily as directed. 11/03/22   Rancour, Garnette, MD  cetirizine  (ZYRTEC  ALLERGY) 10 MG tablet Take 1 tablet (10 mg total) by mouth daily. 10/03/23 10/02/24  Norrine Sharper, MD  clindamycin  (CLEOCIN  T) 1 % lotion Apply topically 2 (two) times daily. 09/28/22   Nooruddin, Saad, MD  diclofenac  Sodium (VOLTAREN  ARTHRITIS PAIN) 1 % GEL Apply 2 grams topically 4 (four) times daily. 10/03/23   Norrine Sharper, MD  erythromycin  ophthalmic ointment Place 0.5 inch ribbon to the right eye 2 (two) times daily for 10 days. 02/18/23     erythromycin  ophthalmic ointment Apply 0.5 inch ribbon to both eyes at bedtime. 10/04/23     erythromycin  ophthalmic ointment Apply 1/2 inches to both eyes at bedtime. 11/15/23     fluticasone  (FLONASE ) 50 MCG/ACT nasal spray Place 1 spray into both nostrils daily. 10/03/23   Norrine Sharper, MD  guaiFENesin -codeine  100-10 MG/5ML  syrup Take 5 mLs by mouth 3 (three) times daily as needed for cough. 04/03/23   Tawkaliyar, Roya, DO  hydrochlorothiazide  (HYDRODIURIL ) 12.5 MG tablet Take 1 tablet (12.5 mg total) by mouth in the morning. 10/03/23   Norrine Sharper, MD  losartan  (COZAAR ) 50 MG tablet Take 1 tablet (50 mg total) by mouth daily. 10/03/23   Norrine Sharper, MD  dicyclomine  (BENTYL ) 20 MG tablet Take 1 tablet (20 mg total) by mouth 2 (two) times daily. Patient not taking: No sig reported 04/12/20 06/27/20  Harris, Abigail, PA-C    Allergies: Eicosapentaenoic acid (epa) (fish), Fish-derived products, Penicillins, and Latex    Review of Systems  Updated Vital Signs BP (!) 161/100 (BP Location: Right Arm)   Pulse (!) 101   Temp 98.4 F (36.9 C) (Oral)   Resp 20   Ht 5' 2 (1.575 m)   Wt 98.9 kg   SpO2 98%   BMI 39.87 kg/m   Physical Exam Vitals and nursing note reviewed.  Constitutional:      General: She is not in acute distress.    Appearance: She is well-developed.  HENT:     Head: Normocephalic and atraumatic.  Eyes:     Conjunctiva/sclera: Conjunctivae normal.  Cardiovascular:     Rate and Rhythm: Normal rate and regular rhythm.  Pulmonary:     Effort: Pulmonary effort is normal. No respiratory distress.     Breath sounds: Normal breath sounds. No stridor.  Abdominal:  General: There is no distension.  Skin:    General: Skin is warm and dry.  Neurological:     Mental Status: She is alert and oriented to person, place, and time.     Cranial Nerves: No cranial nerve deficit.  Psychiatric:        Mood and Affect: Mood normal.     (all labs ordered are listed, but only abnormal results are displayed) Labs Reviewed  URINALYSIS, ROUTINE W REFLEX MICROSCOPIC - Abnormal; Notable for the following components:      Result Value   APPearance TURBID (*)    Glucose, UA 50 (*)    Leukocytes,Ua LARGE (*)    Bacteria, UA RARE (*)    All other components within normal limits  CBG  MONITORING, ED - Abnormal; Notable for the following components:   Glucose-Capillary 308 (*)    All other components within normal limits  I-STAT CHEM 8, ED - Abnormal; Notable for the following components:   Creatinine, Ser 1.30 (*)    Glucose, Bld 330 (*)    All other components within normal limits  CBC  CBG MONITORING, ED    EKG: None  Radiology: No results found.   Procedures   Medications Ordered in the ED - No data to display                                  Medical Decision Making Adult female with a history of type 2 diabetes presents with hyperglycemia.  Given the now the symptoms, only minimal vital sign abnormalities, glucose 1 1, low suspicion no consideration of DKA versus other complication of diabetes.  Patient monitored, cardiac 100 sinus normal pulse ox 99% room air normal  Amount and/or Complexity of Data Reviewed Labs: ordered. Decision-making details documented in ED Course.   Leukos 300 no evidence for DKA.  Urinalysis with glucosuria, no infection.  We had a lengthy conversation about the need to follow-up with primary care, dietary and lifestyle modifications that may be beneficial for diabetes control, patient not currently interested in additional pharmaceutical products, and given absence of evidence for complicated diabetic state, patient will not start a medication today will follow-up closely with primary care.     Final diagnoses:  Hyperglycemia    ED Discharge Orders     None          Garrick Charleston, MD 11/17/23 2236

## 2023-11-17 NOTE — Discharge Instructions (Signed)
 Please be sure to follow-up with your physician.  Happy birthday

## 2023-11-20 ENCOUNTER — Other Ambulatory Visit (HOSPITAL_COMMUNITY): Payer: Self-pay

## 2023-11-20 ENCOUNTER — Ambulatory Visit (INDEPENDENT_AMBULATORY_CARE_PROVIDER_SITE_OTHER)

## 2023-11-20 ENCOUNTER — Other Ambulatory Visit: Payer: Self-pay

## 2023-11-20 VITALS — BP 135/83 | HR 81 | Temp 97.7°F | Ht 62.0 in | Wt 219.0 lb

## 2023-11-20 DIAGNOSIS — N179 Acute kidney failure, unspecified: Secondary | ICD-10-CM | POA: Diagnosis not present

## 2023-11-20 DIAGNOSIS — J302 Other seasonal allergic rhinitis: Secondary | ICD-10-CM | POA: Diagnosis not present

## 2023-11-20 DIAGNOSIS — E1165 Type 2 diabetes mellitus with hyperglycemia: Secondary | ICD-10-CM

## 2023-11-20 DIAGNOSIS — I10 Essential (primary) hypertension: Secondary | ICD-10-CM | POA: Diagnosis not present

## 2023-11-20 DIAGNOSIS — Z7984 Long term (current) use of oral hypoglycemic drugs: Secondary | ICD-10-CM

## 2023-11-20 LAB — GLUCOSE, CAPILLARY: Glucose-Capillary: 254 mg/dL — ABNORMAL HIGH (ref 70–99)

## 2023-11-20 LAB — POCT GLYCOSYLATED HEMOGLOBIN (HGB A1C): Hemoglobin A1C: 9.5 % — AB (ref 4.0–5.6)

## 2023-11-20 MED ORDER — CETIRIZINE HCL 10 MG PO TABS
10.0000 mg | ORAL_TABLET | Freq: Every day | ORAL | 2 refills | Status: DC
  Filled 2023-11-20 – 2023-12-11 (×2): qty 30, 30d supply, fill #0
  Filled 2024-01-16: qty 30, 30d supply, fill #1
  Filled 2024-02-18: qty 30, 30d supply, fill #2

## 2023-11-20 MED ORDER — METFORMIN HCL 1000 MG PO TABS
1000.0000 mg | ORAL_TABLET | Freq: Two times a day (BID) | ORAL | 3 refills | Status: AC
  Filled 2023-11-20: qty 180, 90d supply, fill #0
  Filled 2024-02-18: qty 180, 90d supply, fill #1

## 2023-11-20 NOTE — Progress Notes (Signed)
 CC: ED follow-up for hyperglycemia  HPI:  Danielle Mason is a 63 y.o. female with pertinent PMH of DMII, HTN, HLD, and seasonal allergies who presents for follow-up of ED visit for hyperglycemia on 8/3.  Please see problem-based assessment and plan for further details.    Review of Systems  Constitutional:  Negative for chills and fever.  Eyes:  Negative for blurred vision and double vision.  Respiratory:  Negative for cough and shortness of breath.   Cardiovascular:  Negative for chest pain.  Gastrointestinal:  Negative for nausea and vomiting.  Genitourinary:  Negative for frequency.  Neurological:  Negative for dizziness.    Medications: Current Outpatient Medications  Medication Instructions   acetaminophen  (TYLENOL ) 325 mg, Oral, Every 6 hours PRN   atorvastatin  (LIPITOR) 40 mg, Oral, Daily   benzonatate  (TESSALON ) 100 mg, Oral, Every 8 hours   blood glucose meter kit and supplies KIT Use up to four times daily as directed.   cetirizine  (ZYRTEC  ALLERGY) 10 mg, Oral, Daily   diclofenac  Sodium (VOLTAREN  ARTHRITIS PAIN) 1 % GEL Apply 2 grams topically 4 (four) times daily.   erythromycin  ophthalmic ointment Apply 0.5 inch ribbon to both eyes at bedtime.   erythromycin  ophthalmic ointment Apply 1/2 inches to both eyes at bedtime.   fluticasone  (FLONASE ) 50 MCG/ACT nasal spray 1 spray, Each Nare, Daily   hydrochlorothiazide  (HYDRODIURIL ) 12.5 mg, Oral, Every morning   losartan  (COZAAR ) 50 mg, Oral, Daily   metFORMIN  (GLUCOPHAGE ) 1,000 mg, Oral, 2 times daily with meals     Physical Exam:  Vitals:   11/20/23 1433  BP: 135/83  Pulse: 81  Temp: 97.7 F (36.5 C)  TempSrc: Oral  SpO2: 96%  Weight: 219 lb (99.3 kg)  Height: 5' 2 (1.575 m)    Physical Exam Constitutional:      General: She is not in acute distress.    Appearance: She is not ill-appearing.  Cardiovascular:     Rate and Rhythm: Normal rate and regular rhythm.  Pulmonary:     Effort: No  respiratory distress.  Neurological:     Mental Status: She is alert.      Latest Reference Range & Units 10/03/23 09:41 11/17/23 20:27  Sodium 135 - 145 mmol/L 139 137  Potassium 3.5 - 5.1 mmol/L 4.3 4.9  Chloride 98 - 111 mmol/L 104 103  CO2 20 - 29 mmol/L 19 (L)   Glucose 70 - 99 mg/dL 814 (H) 669 (H)  BUN 8 - 23 mg/dL 17 23  Creatinine 9.55 - 1.00 mg/dL 9.24 8.69 (H)  Calcium  8.7 - 10.3 mg/dL 9.9   Anion gap 89.9 - 18.0 mmol/L 16.0   BUN/Creatinine Ratio 12 - 28  23   eGFR >59 mL/min/1.73 89   (L): Data is abnormally low (H): Data is abnormally high   Latest Reference Range & Units 07/08/23 10:03 11/17/23 19:53 11/20/23 14:47  Glucose-Capillary 70 - 99 mg/dL 889 (H) 691 (H) 745 (H)  (H): Data is abnormally high  Latest Reference Range & Units 06/01/22 09:59 11/05/22 14:40 02/19/23 11:16 07/08/23 10:03 11/20/23 14:47  Hemoglobin A1C 4.0 - 5.6 % 6.6 (H) 8.6 ! 6.4 ! Pend 6.3 ! 9.5 !  (H): Data is abnormally high !: Data is abnormal Assessment & Plan:   Assessment & Plan Type 2 diabetes mellitus with hyperglycemia, without long-term current use of insulin  [facility]) Patient presented to the ED on August 3 after taking her blood sugar and finding it to be over 400.  She was asymptomatic at that time, but did have an increased serum creatinine level above her baseline.  Urine dipstick without protein. She had been intermittently checking her blood sugar before and finding it to be around 160, but she had not taken her blood sugar for several weeks.  She stopped taking metformin  in June as her A1c in March was 6.3.  She was previously taking 500 mg twice daily. She wanted to see what would happen if she tried to control her diabetes with her diet.  Today her A1c is 9.5. Her A1c was previously as high as 8.6, and lowered to 6.3 with only metformin  500 mg twice daily.  Because of her excellent response to metformin  in the past, I think it is reasonable to start with 1 agent today, and I will  prescribe metformin  1000 mg twice a day.  We had extensive discussions about lifestyle modifications that she can make including losing weight, drinking less sugary drinks, and eating less fried foods.  She was also advised to check her blood sugar more regularly.    - Metformin  1000 mg twice daily -Continue atorvastatin  40 mg daily. -She had a visit with her ophthalmologist last week and was told that she had no problems -She will be due for a foot exam in November -Follow-up in 3 months for diabetes with A1C and foot exam AKI (acute kidney injury) (HCC) Serum creatinine elevated to 1.30 at the ED visit on 8/3.  Baseline around 0.7.  Likely due to a prerenal AKI in the setting of her hyperglycemia.  We will recheck this today to see if it has resolved.  If it remains elevated we will pause her losartan  for a while. She was also encouraged to drink more water instead of sugary beverages to maintain hydration. Seasonal allergic rhinitis, unspecified trigger Patient requesting refill of Zyrtec  10 mg.  Refill provided. Essential hypertension Blood pressure well-controlled today at 135/83.  Continue current regimen of losartan  50 mg and hydrochlorothiazide  12.5 mg. May pause Losartan  if AKI persists.  Orders Placed This Encounter  Procedures   Glucose, capillary   Basic metabolic panel with GFR   POC Hbg A1C     Patient seen with Dr. Dayton Eastern   Melvenia Morrison, MD [facility] Internal Medicine Resident PGY-1 Clinic Phone: [phone] Please contact the on call pager at [phone] for any urgent or emergent needs.

## 2023-11-20 NOTE — Assessment & Plan Note (Signed)
 Patient requesting refill of Zyrtec  10 mg.  Refill provided.

## 2023-11-20 NOTE — Patient Instructions (Addendum)
 Thank you, [name], for allowing us  to provide your care today. Today we discussed . . .  > Diabetes       - Your A1C was high today at 9.5. This is most likely because you had stopped Metformin  to see if you could maintain control of your diabetes with diet. Unfortunately, your blood sugar is high today. I have prescribed Metformin  1000mg  twice a day. We also discussed a healthy diet, including limiting added sugars and avoiding fried foods. I have also ordered labs to look at your kidney function. We will have you follow-up in 3 months for another A1C check. If you begin to have symptoms that are unusual, please call our office for another office visit.    I have ordered the following labs for you:   Lab Orders         Glucose, capillary         Basic metabolic panel with GFR         POC Hbg A1C       Referrals ordered today:   Referral Orders  No referral(s) requested today      Follow up: 3 months    Remember:  Should you have any questions or concerns please call the internal medicine clinic at [phone].     Melvenia Morrison, [facility]

## 2023-11-20 NOTE — Assessment & Plan Note (Signed)
 Blood pressure well-controlled today at 135/83.  Continue current regimen of losartan  50 mg and hydrochlorothiazide  12.5 mg. May pause Losartan  if AKI persists.

## 2023-11-20 NOTE — Assessment & Plan Note (Signed)
 Patient presented to the ED on August 3 after taking her blood sugar and finding it to be over 400.  She was asymptomatic at that time, but did have an increased serum creatinine level above her baseline.  Urine dipstick without protein. She had been intermittently checking her blood sugar before and finding it to be around 160, but she had not taken her blood sugar for several weeks.  She stopped taking metformin  in June as her A1c in March was 6.3.  She was previously taking 500 mg twice daily. She wanted to see what would happen if she tried to control her diabetes with her diet.  Today her A1c is 9.5. Her A1c was previously as high as 8.6, and lowered to 6.3 with only metformin  500 mg twice daily.  Because of her excellent response to metformin  in the past, I think it is reasonable to start with 1 agent today, and I will prescribe metformin  1000 mg twice a day.  We had extensive discussions about lifestyle modifications that she can make including losing weight, drinking less sugary drinks, and eating less fried foods.  She was also advised to check her blood sugar more regularly.    - Metformin  1000 mg twice daily -Continue atorvastatin  40 mg daily. -She had a visit with her ophthalmologist last week and was told that she had no problems -She will be due for a foot exam in November -Follow-up in 3 months for diabetes with A1C and foot exam

## 2023-11-21 ENCOUNTER — Ambulatory Visit: Payer: Self-pay

## 2023-11-21 ENCOUNTER — Other Ambulatory Visit (HOSPITAL_COMMUNITY): Payer: Self-pay

## 2023-11-21 LAB — BASIC METABOLIC PANEL WITH GFR
BUN/Creatinine Ratio: 22 (ref 12–28)
BUN: 15 mg/dL (ref 8–27)
CO2: 21 mmol/L (ref 20–29)
Calcium: 10 mg/dL (ref 8.7–10.3)
Chloride: 99 mmol/L (ref 96–106)
Creatinine, Ser: 0.68 mg/dL (ref 0.57–1.00)
Glucose: 221 mg/dL — ABNORMAL HIGH (ref 70–99)
Potassium: 4.3 mmol/L (ref 3.5–5.2)
Sodium: 137 mmol/L (ref 134–144)
eGFR: 97 mL/min/1.73 (ref >59)

## 2023-11-21 NOTE — Progress Notes (Signed)
Internal Medicine Clinic Attending  I was physically present during the key portions of the resident provided service and participated in the medical decision making of patient's management care. I reviewed pertinent patient test results.  The assessment, diagnosis, and plan were formulated together and I agree with the documentation in the resident's note.  Gust Rung, DO

## 2023-11-26 ENCOUNTER — Other Ambulatory Visit (HOSPITAL_COMMUNITY): Payer: Self-pay

## 2023-12-11 ENCOUNTER — Ambulatory Visit

## 2023-12-11 ENCOUNTER — Ambulatory Visit: Payer: Self-pay

## 2023-12-11 ENCOUNTER — Telehealth: Payer: Self-pay | Admitting: *Deleted

## 2023-12-11 ENCOUNTER — Other Ambulatory Visit (HOSPITAL_COMMUNITY): Payer: Self-pay

## 2023-12-11 VITALS — BP 129/63 | HR 86 | Temp 97.8°F | Ht 62.0 in | Wt 219.6 lb

## 2023-12-11 DIAGNOSIS — L309 Dermatitis, unspecified: Secondary | ICD-10-CM | POA: Diagnosis present

## 2023-12-11 MED ORDER — HYDROCORTISONE 1 % EX CREA
1.0000 | TOPICAL_CREAM | Freq: Two times a day (BID) | CUTANEOUS | 1 refills | Status: AC
  Filled 2023-12-11: qty 28, 7d supply, fill #0

## 2023-12-11 NOTE — Telephone Encounter (Signed)
 Pt is currently being seen by Dr Isobel.

## 2023-12-11 NOTE — Telephone Encounter (Addendum)
 3 attempts to contact pt, no contact made. No VM. Routing to clinic.   Copied from CRM #[id number]. Topic: Clinical - Medical Advice >> Dec 11, 2023 10:04 AM Graeme ORN wrote: Reason for CRM: Patient called. Has itchy bump on wrist up arm. Would like to know what she can do about it. No appts today with provider office. Did not want to check another location. Thank You    ----------------------------------------------------------------------- From previous Reason for Contact - Scheduling: Patient/patient representative is calling to schedule an appointment. Refer to attachments for appointment information.

## 2023-12-11 NOTE — Patient Instructions (Addendum)
 Thank you, Ms.Elizette Mae Mckinley for allowing us  to provide your care today. Today we discussed the new rash you noticed on your forearm. It looks like some contact dermatitis, which can be very itchy. I will send some anti-itch medicines to your pharmacy to help. Also make sure to stop using alcohol rubs as this may dry out the skin and irritate the bumps more.    New medications: -Zyrtec  10mg  daily for 7 days -Muciprocin cream   My Chart Access: [email or web address]  Please follow-up if you notice you are developing new or worsening symptoms like swelling, redness, and the rash being hot to the touch, or if the itching does not get better within a week.     We look forward to seeing you next time. Please call our clinic at [phone] if you have any questions or concerns. The best time to call is Monday-Friday from 9am-4pm, but there is someone available 24/7. If after hours or the weekend, call the main hospital number and ask for the Internal Medicine Resident On-Call. If you need medication refills, please notify your pharmacy one week in advance and they will send us  a request.   Thank you for letting us  take part in your care. Wishing you the best!  Lynnell Fiumara, DO 12/11/2023, 10:59 AM [facility]

## 2023-12-11 NOTE — Progress Notes (Signed)
 Acute Office Visit  Subjective:     Patient ID: Danielle Mason, female    DOB: Dec 13, 1960, 63 y.o.   MRN: [id number]  Chief Complaint  Patient presents with   Rash    Patient is in today for a new rash that started yesterday on her forearm. 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  She states that it is incredibly itchy, and has noticed small bumps that are flesh-colored appear on her wrists.  She has not taken anything to help with the itching, but notes that alcohol swabs show these bumps well.  She notes that she started up driving again for the school district yesterday, but did not come in direct contact with any children.  She also denies any new contact with cleaning supplies, soaps, detergents, perfumes, or other irritants.  Does not think she has touched any outdoor allergens that could have irritated her skin.  She is unsure what caused it, but wanted to ensure that this was not measles as she saw there is a measles outbreak recently on the news.    Review of Systems  Constitutional:  Negative for chills and fever.  HENT: Negative.    Eyes: Negative.   Respiratory: Negative.    Cardiovascular: Negative.   Gastrointestinal: Negative.   Skin:  Positive for itching and rash.  Neurological: Negative.   Psychiatric/Behavioral: Negative.         Objective:    BP 129/63 (BP Location: Right Arm, Patient Position: Sitting, Cuff Size: Small)   Pulse 86   Temp 97.8 F (36.6 C) (Oral)   Ht 5' 2 (1.575 m)   Wt 219 lb 9.6 oz (99.6 kg)   SpO2 99%   BMI 40.17 kg/m   Physical Exam Constitutional:      Appearance: Normal appearance.  HENT:     Head: Normocephalic and atraumatic.     Mouth/Throat:     Mouth: Mucous membranes are moist.  Cardiovascular:     Rate and Rhythm: Normal rate and regular rhythm.     Pulses: Normal pulses.     Heart sounds: Normal heart sounds.  Pulmonary:     Effort: Pulmonary effort is normal. No respiratory  distress.     Breath sounds: Normal breath sounds.  Abdominal:     General: Bowel sounds are normal.     Palpations: Abdomen is soft.  Skin:    General: Skin is warm and dry.     Findings: Rash present.     Comments: Patient has small flesh-colored papules on the left and right wrist extensor surfaces.  There is no marked erythema, purulence, or edema.  Patient endorses itching on the rash, but no other symptoms.  Neurological:     General: No focal deficit present.     Mental Status: She is alert and oriented to person, place, and time.  Psychiatric:        Mood and Affect: Mood normal.        Behavior: Behavior normal.       Assessment & Plan:   Contact Dermatitis Patient presents with new flesh-colored papules that are on the left wrist extensor surface. Unsure of the cause of the rash, as she denies any contact allergens. She does have a history of seasonal allergies, for which she has been prescribed Zyrtec  10 mg.  Encouraged her to take Zyrtec  once a day for the next 7 days to help with the acute itching.  Will also  prescribe hydrocortisone  cream to assist with itching and to reduce these papules from worsening -Hydrocortisone  1% cream -Zyrtec  10mg  daily for one week.   Patient seen with Dr. Ronnald Sergeant.   Return in about 1 week (around 12/18/2023) for diabetes management follow up.  Bobby Ragan, DO Internal Medicine Resident, PGY-1 [facility] Internal Medicine Residency 2:27 PM 12/11/2023

## 2023-12-11 NOTE — Telephone Encounter (Signed)
 Walk-in c/o rash on both arms ; started yesterday. 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 Talked to Dr Lau,Attending. Dr Isobel will see pt.

## 2023-12-13 ENCOUNTER — Other Ambulatory Visit (HOSPITAL_BASED_OUTPATIENT_CLINIC_OR_DEPARTMENT_OTHER): Payer: Self-pay

## 2023-12-13 ENCOUNTER — Other Ambulatory Visit: Payer: Self-pay

## 2023-12-13 ENCOUNTER — Other Ambulatory Visit (HOSPITAL_COMMUNITY): Payer: Self-pay

## 2023-12-13 NOTE — Progress Notes (Signed)
 Internal Medicine Clinic Attending  I was physically present during the key portions of the resident provided service and participated in the medical decision making of patient's management care. I reviewed pertinent patient test results.  The assessment, diagnosis, and plan were formulated together and I agree with the documentation in the resident's note.  Acute visit for two days of pruritic rash over flexor surface of forearms and wrists. No burrows or linear areas, no one at home has similar symptoms to suspect scabies. No systemic symptoms. No change in exposures or medications. Appears most consistent with a mild irritant or contact dermatitis, unclear trigger. Advised she discontinue use of alcohol pads to avoid drying. Topical low-potency steroid cream for 1-2 weeks. If spreading or developing systemic symptoms, recommend she return for additional evaluation.  Karna Fellows, MD

## 2023-12-16 ENCOUNTER — Other Ambulatory Visit (HOSPITAL_COMMUNITY): Payer: Self-pay

## 2023-12-17 ENCOUNTER — Other Ambulatory Visit (HOSPITAL_COMMUNITY): Payer: Self-pay

## 2023-12-18 ENCOUNTER — Encounter

## 2023-12-18 NOTE — Patient Instructions (Incomplete)
 Thank you, Ms.Tram Mae Mckinley for allowing us  to provide your care today. Today we discussed ***.    Referrals: -  New medications: -  I have ordered the following labs for you:  Lab Orders  No laboratory test(s) ordered today     I will call if any are abnormal. All of your labs can be accessed through My Chart.   My Chart Access: [email or web address]  Please follow-up in:    We look forward to seeing you next time. Please call our clinic at [phone] if you have any questions or concerns. The best time to call is Monday-Friday from 9am-4pm, but there is someone available 24/7. If after hours or the weekend, call the main hospital number and ask for the Internal Medicine Resident On-Call. If you need medication refills, please notify your pharmacy one week in advance and they will send us  a request.   Thank you for letting us  take part in your care. Wishing you the best!  Riyah Bardon, DO 12/18/2023, 7:58 AM [facility]

## 2023-12-18 NOTE — Progress Notes (Deleted)
   Established Patient Office Visit  Subjective   Patient ID: Danielle Mason, female    DOB: February 19, 1961  Age: 63 y.o. MRN: [id number]  No chief complaint on file.   HPI  {History (Optional):23778}  ROS    Objective:     There were no vitals taken for this visit. {Vitals History (Optional):23777}  Physical Exam   No results found for any visits on 12/18/23.  {Labs (Optional):23779}  The 10-year ASCVD risk score (Arnett DK, et al., 2019) is: 17.8%    Assessment & Plan:  Patient seen with {IMTSattending2025/2026:32924}.   Type 2 Diabetes Mellitus Patient hospitalized on 11/17/2023 for hyperglycemia over 400. Patient had previously been on Metformin  500mg  twice daily, however patient wanted to see how her blood sugar control would be off medication. At her 11/20/2023 office visit, her A1c was 9.5% when it was previously 6.3%. She was prescribed Metformin  1000mg  twice daily, of which she *** been taking. She has *** been checking her blood sugars regularly. She reports *** hypoglycemic episodes. She is also making efforts to reduce processed foods and drinks, and attempting to lose weight. - Continue Metformin  1000mg  twice daily - Foot exam in November  - Recheck A1c in November  Contact Dermatitis Patient presented last week for a rash that had presented on her left wrist extensor surface. She is still unsure as to what caused it, but notes that her rash ***. She was taking Zyrtec  10mg  daily and hydrocortisone  cream to help with her itching.       Danielle Demos, DO Internal Medicine Resident, PGY-1 [facility] 8:03 AM 12/18/2023

## 2023-12-24 ENCOUNTER — Other Ambulatory Visit (HOSPITAL_COMMUNITY): Payer: Self-pay

## 2023-12-25 ENCOUNTER — Telehealth: Payer: Self-pay

## 2023-12-25 ENCOUNTER — Ambulatory Visit (INDEPENDENT_AMBULATORY_CARE_PROVIDER_SITE_OTHER): Payer: Self-pay

## 2023-12-25 ENCOUNTER — Other Ambulatory Visit (HOSPITAL_COMMUNITY): Payer: Self-pay

## 2023-12-25 VITALS — BP 131/79 | HR 89 | Temp 98.3°F | Ht 62.0 in | Wt 219.2 lb

## 2023-12-25 DIAGNOSIS — E119 Type 2 diabetes mellitus without complications: Secondary | ICD-10-CM

## 2023-12-25 DIAGNOSIS — E785 Hyperlipidemia, unspecified: Secondary | ICD-10-CM

## 2023-12-25 DIAGNOSIS — Z7984 Long term (current) use of oral hypoglycemic drugs: Secondary | ICD-10-CM

## 2023-12-25 DIAGNOSIS — I10 Essential (primary) hypertension: Secondary | ICD-10-CM

## 2023-12-25 LAB — GLUCOSE, CAPILLARY: Glucose-Capillary: 114 mg/dL — ABNORMAL HIGH (ref 70–99)

## 2023-12-25 MED ORDER — DEXCOM G7 SENSOR MISC
1.0000 | 11 refills | Status: DC
  Filled 2023-12-25: qty 2, 28d supply, fill #0

## 2023-12-25 MED ORDER — FREESTYLE LIBRE 3 PLUS SENSOR MISC
11 refills | Status: AC
  Filled 2023-12-25 – 2024-02-18 (×2): qty 2, 30d supply, fill #0

## 2023-12-25 MED ORDER — FREESTYLE LIBRE 3 READER DEVI
1.0000 | Freq: Every day | 0 refills | Status: AC
  Filled 2023-12-25: qty 1, 90d supply, fill #0

## 2023-12-25 NOTE — Progress Notes (Signed)
 Established Patient Office Visit  Subjective   Patient ID: Danielle Mason, female    DOB: Sep 11, 1960  Age: 63 y.o. MRN: [id number]  Chief Complaint  Patient presents with   Follow-up   Diabetes   Danielle Mason is a 63 year old female with a past medical history of HTN, T2DM, and HLD who presents to the clinic today to discuss her diabetes management. Please see problem-based assessment and plan below for details.   Review of Systems  Constitutional: Negative.   HENT: Negative.    Eyes: Negative.   Respiratory: Negative.    Cardiovascular: Negative.   Gastrointestinal: Negative.   Genitourinary: Negative.   Musculoskeletal: Negative.   Skin: Negative.   Neurological: Negative.   Endo/Heme/Allergies: Negative.   Psychiatric/Behavioral: Negative.       Objective:    BP 131/79 (BP Location: Right Arm, Patient Position: Sitting, Cuff Size: Small)   Pulse 89   Temp 98.3 F (36.8 C) (Oral)   Ht 5' 2 (1.575 m)   Wt 219 lb 3.2 oz (99.4 kg)   SpO2 99%   BMI 40.09 kg/m  Physical Exam Constitutional:      Appearance: Normal appearance.  HENT:     Mouth/Throat:     Mouth: Mucous membranes are moist.  Eyes:     Pupils: Pupils are equal, round, and reactive to light.  Cardiovascular:     Rate and Rhythm: Normal rate and regular rhythm.     Pulses: Normal pulses.     Heart sounds: Normal heart sounds.  Pulmonary:     Effort: Pulmonary effort is normal.     Breath sounds: Normal breath sounds.  Abdominal:     General: Abdomen is flat.     Palpations: Abdomen is soft.  Musculoskeletal:        General: Normal range of motion.  Skin:    General: Skin is warm.  Neurological:     General: No focal deficit present.     Mental Status: She is alert and oriented to person, place, and time.  Psychiatric:        Mood and Affect: Mood normal.        Behavior: Behavior normal.     The 10-year ASCVD risk score (Arnett DK, et al., 2019) is: 18.4%    Assessment & Plan:    Patient discussed with Dr. Mliss Pouch.   Type 2 Diabetes Mellitus Patient hospitalized on 11/17/2023 for hyperglycemia over 400. Patient had previously been on Metformin  500mg  twice daily, however patient wanted to see how her blood sugar control would be off medication. At her 11/20/2023 office visit, her A1c was 9.5% when it was previously 6.3%. She was prescribed Metformin  1000mg  twice daily, of which she has been taking. She has not been checking her blood sugars regularly, checks about weekly. She reports no hypoglycemic episodes, with her lowest readings in the 110s. She is also making efforts to reduce processed foods and drinks, and attempting to lose weight. She is inquiring about getting a Dexcom to better track her sugars.  I think this is a reasonable request, and we will send a prescription for a Dexcom G7 to help patient stay adherent to blood sugar checks, and ultimately improve her A1c.  She is due for her urine ACR today.  I will have her come back in early November for an A1c recheck, and her diabetic foot exam. - Continue Metformin  1000mg  twice daily - Dexcom G7 sensors sent to pharmacy.  Will have patient schedule  appointment with Ar[facility] for application education - Foot exam in November  - Recheck A1c in November  - Urine microalbumin/creatinine ratio  Essential Hypertension BP today 131/79. Regimen includes Losartan  50mg  daily and hydrochlorothiazide  12.5 mg daily.  - Continue antihypertensive agents  Hyperlipidemia Patient on Atorvastatin  40mg . She denies any symptoms. Lipid profile from 02/2023 shows total cholesterol 157 and LDL 94.  -- Recheck Lipid panel in 02/2024   Lamonte Colace, DO Internal Medicine Resident, PGY-1 [facility] 10:42 AM 12/25/2023

## 2023-12-25 NOTE — Telephone Encounter (Signed)
 Prior Authorization for patient (Dexcom G7 Sensor) came through on cover my meds was submitted with last office notes and labs awaiting approval or denial.  [id number]

## 2023-12-25 NOTE — Telephone Encounter (Signed)
 Per patient insurance the preferred alternative is Freestyle libre continuous glucose monitor.

## 2023-12-25 NOTE — Patient Instructions (Addendum)
 Thank you, Ms.Michelene Mae Mckinley for allowing us  to provide your care today. Today we discussed your diabetes and starting a Dexcom.    New medications: -Dexcom G7 Sensor devices  I have ordered the following labs for you:  Lab Orders         Glucose, capillary         Microalbumin / Creatinine Urine Ratio         POCT CBG (Fasting - Glucose)       I will call if any are abnormal. All of your labs can be accessed through My Chart.   My Chart Access: [email or web address]  Please follow-up in: 2 months to recheck A1c around 02/20/2024    We look forward to seeing you next time. Please call our clinic at [phone] if you have any questions or concerns. The best time to call is Monday-Friday from 9am-4pm, but there is someone available 24/7. If after hours or the weekend, call the main hospital number and ask for the Internal Medicine Resident On-Call. If you need medication refills, please notify your pharmacy one week in advance and they will send us  a request.   Thank you for letting us  take part in your care. Wishing you the best!  Airel Magadan, DO  12/25/2023, 11:03 AM [facility]

## 2023-12-25 NOTE — Addendum Note (Signed)
 Addended by: ISOBEL SPIRES on: 12/25/2023 02:15 PM   Modules accepted: Orders

## 2023-12-25 NOTE — Telephone Encounter (Signed)
 Rogers Smalling (Key: [id number]) PA Case ID #: [id number] Need Help? Call us  at [phone] Archived today by you Outcome Denied today by [employer] 2017 Plan requirements for the approval of this request: Diagnosis of a medically-accepted indication, AND; The patient has tried and failed at least one preferred alternative, OR; The prescriber can submit a supporting statement to explain Drug Dexcom G7 Sensor ePA cloud logo Form [employer] Medicare Electronic Prior Authorization Form 2017 NCPDP  Awaiting additional information on why this request has been denied.

## 2023-12-26 ENCOUNTER — Other Ambulatory Visit (HOSPITAL_COMMUNITY): Payer: Self-pay

## 2023-12-26 LAB — MICROALBUMIN / CREATININE URINE RATIO
Creatinine, Urine: 112.8 mg/dL
Microalb/Creat Ratio: 32 mg/g{creat} — ABNORMAL HIGH (ref 0–29)
Microalbumin, Urine: 36.3 ug/mL

## 2023-12-27 ENCOUNTER — Ambulatory Visit: Payer: Self-pay

## 2023-12-27 NOTE — Progress Notes (Signed)
 Microalbumin/creatinine ratio mildly elevated at 32. Patient already on ARB for HTN. Will continue to monitor. If persistently not improving, will consider adding SGLT2i.

## 2023-12-30 NOTE — Progress Notes (Signed)
 Internal Medicine Clinic Attending  Case discussed with the resident at the time of the visit.  We reviewed the resident's history and exam and pertinent patient test results.  I agree with the assessment, diagnosis, and plan of care documented in the resident's note.

## 2024-01-03 ENCOUNTER — Other Ambulatory Visit (HOSPITAL_COMMUNITY): Payer: Self-pay

## 2024-01-06 ENCOUNTER — Encounter: Payer: Self-pay | Admitting: Dietician

## 2024-01-06 ENCOUNTER — Ambulatory Visit: Admitting: Dietician

## 2024-01-13 ENCOUNTER — Ambulatory Visit: Admitting: Dietician

## 2024-01-13 ENCOUNTER — Telehealth: Payer: Self-pay | Admitting: Dietician

## 2024-01-13 NOTE — Telephone Encounter (Signed)
 Not able to leave a message.

## 2024-01-15 NOTE — Telephone Encounter (Signed)
 Spoke with patient who will look at her schedule and call me back to reschedule her appointment.

## 2024-01-16 ENCOUNTER — Other Ambulatory Visit (HOSPITAL_COMMUNITY): Payer: Self-pay

## 2024-01-20 ENCOUNTER — Other Ambulatory Visit (HOSPITAL_COMMUNITY): Payer: Self-pay

## 2024-01-23 ENCOUNTER — Other Ambulatory Visit: Payer: Self-pay | Admitting: Student

## 2024-01-23 ENCOUNTER — Other Ambulatory Visit (HOSPITAL_COMMUNITY): Payer: Self-pay

## 2024-01-24 ENCOUNTER — Encounter (HOSPITAL_COMMUNITY): Payer: Self-pay

## 2024-01-24 ENCOUNTER — Other Ambulatory Visit (HOSPITAL_COMMUNITY): Payer: Self-pay

## 2024-01-24 NOTE — Telephone Encounter (Signed)
 Medication discontinued 11/20/23.

## 2024-01-27 ENCOUNTER — Ambulatory Visit: Payer: Self-pay

## 2024-01-27 ENCOUNTER — Other Ambulatory Visit (HOSPITAL_COMMUNITY): Payer: Self-pay

## 2024-01-27 ENCOUNTER — Encounter (HOSPITAL_COMMUNITY): Payer: Self-pay

## 2024-01-27 ENCOUNTER — Other Ambulatory Visit: Payer: Self-pay | Admitting: Student

## 2024-01-27 NOTE — Telephone Encounter (Signed)
 Patient asking if she can be fit in this morning, unable to come at 1pm or Wednesday. Asking if she can alternatively be rx cough medication.  FYI Only or Action Required?: Action required by provider: request for appointment and clinical question for provider.  Patient was last seen in primary care on 12/25/2023 by Amilibia, Jaden, DO.  Called Nurse Triage reporting URI.  Symptoms began chronic per pt.  Interventions attempted: Rest, hydration, or home remedies.  Symptoms are: gradually worsening.  Triage Disposition: Discuss With PCP and Callback by Nurse Today (overriding See PCP Within 2 Weeks)  Patient/caregiver understands and will follow disposition?:  Reason for Disposition  Cough lasts > 3 weeks  Answer Assessment - Initial Assessment Questions Patient reports chronic cough that she has been seen for before (unable to clarify when was most recently seen for same), states provider always calls in cough medicine vs seeing her for same.  1. ONSET: When did the cough begin?      States is chronic  2. WHEEZING:     Denies  3. SPUTUM: Describe the color of your sputum (e.g., none, dry cough; clear, white, yellow, green)     Yellow phlem only twice, otherwise unproductive  4. HEMOPTYSIS: Are you coughing up any blood? If so ask: How much? (e.g., flecks, streaks, tablespoons, etc.)     Denies  5. DIFFICULTY BREATHING: Are you having difficulty breathing? If Yes, ask: How bad is it? (e.g., mild, moderate, severe)      Denies  6. FEVER: Do you have a fever? If Yes, ask: What is your temperature, how was it measured, and when did it start?     Denies  7. CARDIAC HISTORY: Do you have any history of heart disease? (e.g., heart attack, congestive heart failure)      Denies  8. LUNG HISTORY: Do you have any history of lung disease?  (e.g., pulmonary embolus, asthma, emphysema)     Denies  Protocols used: Cough - Chronic-A-AH Copied from CRM [id number].  Topic: Clinical - Red Word Triage >> Jan 27, 2024  8:42 AM Graeme ORN wrote: Red Word that prompted transfer to Nurse Triage: Coughing up phlegm in chest - requesting cough medicine - currently pending.

## 2024-01-27 NOTE — Telephone Encounter (Signed)
 Medication discontinued 11/20/23.

## 2024-01-27 NOTE — Telephone Encounter (Signed)
 Patient came by the Clinics this morning.  Was offered an appointment for this afternoon as there were no available appointments in the Clinics.  Unable to come back for.  Given an appointment for Wednesday 01/29/2024.  Coughing clear mucous.  No fever.

## 2024-01-29 ENCOUNTER — Other Ambulatory Visit (HOSPITAL_COMMUNITY): Payer: Self-pay

## 2024-01-29 ENCOUNTER — Ambulatory Visit: Payer: Self-pay

## 2024-01-29 VITALS — BP 125/85 | HR 96 | Temp 98.3°F | Ht 62.0 in | Wt 216.5 lb

## 2024-01-29 DIAGNOSIS — R051 Acute cough: Secondary | ICD-10-CM

## 2024-01-29 MED ORDER — GUAIFENESIN-CODEINE 100-10 MG/5ML PO SOLN
5.0000 mL | Freq: Three times a day (TID) | ORAL | 0 refills | Status: AC | PRN
  Filled 2024-01-29: qty 120, 8d supply, fill #0

## 2024-01-29 NOTE — Progress Notes (Signed)
 Internal Medicine Clinic Attending  I was physically present during the key portions of the resident provided service and participated in the medical decision making of patient's management care. I reviewed pertinent patient test results.  The assessment, diagnosis, and plan were formulated together and I agree with the documentation in the resident's note.  Francesco Elsie NOVAK, MD

## 2024-01-29 NOTE — Progress Notes (Signed)
 63yo F PMH uncontrolled diabetes, HTN, HLD, GERD, IBS,   Here for cough, gets cough every year Air conditioner on bus irritates and gets cough Guaifenesin  with codeine --helps every year  Danielle Mason    Acute Office Visit  Subjective:     Patient ID: Danielle Mason, female    DOB: 23-Jun-1960, 63 y.o.   MRN: [id number]  Chief Complaint  Patient presents with   Cough    Cough for about 1  week  / productive cough - yellow sputum  / medication refill   63 year old female with past medical history of uncontrolled type 2 diabetes, hypertension, hyperlipidemia, GERD, and IBS who presents today for evaluation of a cough that has persisted for the past week.  Please see problem-based assessment and plan below.   Review of Systems  Constitutional:  Negative for chills and fever.  HENT:  Negative for congestion, ear pain, sinus pain and sore throat.   Respiratory:  Positive for cough. Negative for hemoptysis, shortness of breath and wheezing.   Gastrointestinal:  Negative for diarrhea, nausea and vomiting.  Neurological:  Negative for dizziness, tingling, sensory change, focal weakness and headaches.        Objective:    BP 125/85 (BP Location: Left Arm, Patient Position: Sitting, Cuff Size: Normal)   Pulse 96   Temp 98.3 F (36.8 C) (Oral)   Ht 5' 2 (1.575 m)   Wt 216 lb 8 oz (98.2 kg)   SpO2 95%   BMI 39.60 kg/m     Const: Awake, alert in NAD HENT: Normocephalic, atraumatic, mucus membranes moist Card: RRR, No MRG, No pitting edema on LE's bilaterally  Resp: LCTAB, no increased work of breathing.  No wheezes rhonchi or rales Abd: Soft, NTND Extremities: Warm, pink  No results found for any visits on 01/29/24.      Assessment & Plan:   Assessment & Plan Acute cough The patient reports that over the past week she has noticed an increased cough particularly at night.  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  She states that the air conditioner is often blowing directly on her and that this happens approximately once a year around this time where she develops an acute cough.  She denies any sinus symptoms including congestion, sinus  pain, or rhinorrhea.  She also denies any otalgia.  No history of asthma or COPD.   She has been given the guaifenesin  and codeine  in the past which worked very well for her.  She has tried Tessalon  Perles however reports that she does not get nearly as much benefit.  Will prescribe guaifenesin -codeine  today for short course. Orders:   guaiFENesin -codeine  100-10 MG/5ML syrup; Take 5 mLs by mouth 3 (three) times daily as needed for cough.    Danielle Novak, DO

## 2024-02-18 ENCOUNTER — Other Ambulatory Visit (HOSPITAL_COMMUNITY): Payer: Self-pay

## 2024-02-19 ENCOUNTER — Other Ambulatory Visit: Payer: Self-pay

## 2024-02-19 ENCOUNTER — Other Ambulatory Visit (HOSPITAL_COMMUNITY): Payer: Self-pay

## 2024-02-20 NOTE — Progress Notes (Unsigned)
   Established Patient Office Visit  Subjective   Patient ID: Danielle Mason, female    DOB: Jul 20, 1960  Age: 63 y.o. MRN: [id number]  No chief complaint on file.   Danielle Mason is a 63 year old female with a past medical history of HTN, T2DM, and HLD who presents to the clinic today to discuss her diabetes management. Please see problem-based assessment and plan below for details.    ROS    Objective:    There were no vitals taken for this visit. Physical Exam   No results found for any visits on 02/21/24.    The 10-year ASCVD risk score (Arnett DK, et al., 2019) is: 16.6%    Assessment & Plan:   Patient seen with {IMTSattending2025/2026:32924}.  Problem List Items Addressed This Visit       Endocrine   Diabetes (HCC) - Primary    No follow-ups on file.    Kielyn Kardell, DO Internal Medicine Resident, PGY-1 7:08 PM 02/20/2024

## 2024-02-20 NOTE — Patient Instructions (Incomplete)
 Thank you, Danielle Mason for allowing us  to provide your care today. Today we discussed your diabetes.    Referrals: -  New medications: -  I have ordered the following labs for you:  Lab Orders  No laboratory test(s) ordered today     I will call if any are abnormal. All of your labs can be accessed through My Chart.   My Chart Access: [email or web address]  Please follow-up in:    We look forward to seeing you next time. Please call our clinic at [phone] if you have any questions or concerns. The best time to call is Monday-Friday from 9am-4pm, but there is someone available 24/7. If after hours or the weekend, call the main hospital number and ask for the Internal Medicine Resident On-Call. If you need medication refills, please notify your pharmacy one week in advance and they will send us  a request.   Thank you for letting us  take part in your care. Wishing you the best!  Georgene Kopper, DO 02/20/2024, 7:07 PM [facility]

## 2024-02-21 ENCOUNTER — Ambulatory Visit (INDEPENDENT_AMBULATORY_CARE_PROVIDER_SITE_OTHER): Payer: Self-pay

## 2024-02-21 ENCOUNTER — Telehealth: Payer: Self-pay | Admitting: *Deleted

## 2024-02-21 VITALS — BP 133/78 | HR 80 | Ht 62.0 in | Wt 209.0 lb

## 2024-02-21 DIAGNOSIS — Z0000 Encounter for general adult medical examination without abnormal findings: Secondary | ICD-10-CM

## 2024-02-21 DIAGNOSIS — E119 Type 2 diabetes mellitus without complications: Secondary | ICD-10-CM

## 2024-02-21 DIAGNOSIS — E785 Hyperlipidemia, unspecified: Secondary | ICD-10-CM

## 2024-02-21 LAB — POCT GLYCOSYLATED HEMOGLOBIN (HGB A1C): HbA1c POC (<> result, manual entry): 6.5 % (ref 4.0–5.6)

## 2024-02-21 LAB — GLUCOSE, CAPILLARY: Glucose-Capillary: 88 mg/dL (ref 70–99)

## 2024-02-21 NOTE — Telephone Encounter (Signed)
 Will froward to A. Claudene, CMA Copied from CRM [id number]. Topic: Appointments - Appointment Info/Confirmation >> Feb 21, 2024 10:49 AM Danielle Mason wrote: Patient/patient representative is calling for information regarding an appointment.   PT running 15 min late. Will be there no later then 11am today

## 2024-02-21 NOTE — Assessment & Plan Note (Addendum)
 Last A1c 9.5% in 11/2023. Regimen includes Metformin  1000mg  BID, of which she has been taking. A1c today is 6.5%. Commended patient on her success with controlling her blood sugars and taking her Metformin  again. We had few visits previously emphasizing the importance of the medication to prevent rehospitalization, and she has been taking the Metformin  without issue. She also presents with a CGM today, but is unable to meet with Arland today as she is out of office. Pt will follow up for CGM placement at a later time. Unable to do foot exam today as patient had to return to work, but will check at next visit.  -Recheck A1c in 3 months -Continue Metformin  1000mg  BID -Foot exam at next visit.

## 2024-02-21 NOTE — Assessment & Plan Note (Signed)
 Last lipid panel showing LDL 94 and total cholesterol 157 on 02/19/2023. Patient on atorvastatin  40mg , reports adherence to her medicine without side effects. Will recheck lipid panel today.  -Lipid panel

## 2024-02-21 NOTE — Assessment & Plan Note (Signed)
 Discussed with patient the need to start screening for DEXA scans now that she is 63. Highlighted the importance of this test to calculate her risk for fractures, and patient is hesitant but understands this is an exam done once every 2 years. Will place order today and patient will schedule at her convenience. 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   -Declines flu, PCV today -DEXA scan ordered

## 2024-02-24 ENCOUNTER — Other Ambulatory Visit: Payer: Self-pay

## 2024-02-24 DIAGNOSIS — E785 Hyperlipidemia, unspecified: Secondary | ICD-10-CM

## 2024-02-27 ENCOUNTER — Other Ambulatory Visit (HOSPITAL_COMMUNITY): Payer: Self-pay

## 2024-03-02 ENCOUNTER — Other Ambulatory Visit: Payer: Self-pay

## 2024-03-02 DIAGNOSIS — E785 Hyperlipidemia, unspecified: Secondary | ICD-10-CM

## 2024-03-03 LAB — LIPID PANEL
Chol/HDL Ratio: 3.7 ratio (ref 0.0–4.4)
Cholesterol, Total: 163 mg/dL (ref 100–199)
HDL: 44 mg/dL (ref >39)
LDL Chol Calc (NIH): 101 mg/dL — ABNORMAL HIGH (ref 0–99)
Triglycerides: 95 mg/dL (ref 0–149)
VLDL Cholesterol Cal: 18 mg/dL (ref 5–40)

## 2024-03-04 ENCOUNTER — Other Ambulatory Visit (HOSPITAL_COMMUNITY): Payer: Self-pay

## 2024-03-04 ENCOUNTER — Ambulatory Visit: Payer: Self-pay

## 2024-03-04 ENCOUNTER — Other Ambulatory Visit: Payer: Self-pay | Admitting: *Deleted

## 2024-03-04 DIAGNOSIS — E785 Hyperlipidemia, unspecified: Secondary | ICD-10-CM

## 2024-03-04 MED ORDER — ATORVASTATIN CALCIUM 80 MG PO TABS
80.0000 mg | ORAL_TABLET | Freq: Every day | ORAL | 11 refills | Status: AC
  Filled 2024-03-04: qty 30, 30d supply, fill #0
  Filled 2024-04-03: qty 30, 30d supply, fill #1
  Filled 2024-05-20: qty 30, 30d supply, fill #2

## 2024-03-04 NOTE — Telephone Encounter (Signed)
 Called pt who stated her cholesterol pill looks different; I told her probably due to increase in dosage ; she stated it looked different before the increase. I told pt to talk to the pharmacy; stated she did and was told there was a change (might be a mfg change). I told her it was increased form 40 mg to 80 mg; she can take 2 of the 40 mg that she already has and informed new rx has been sent to the pharmacy. Dr Isobel had just called pt today. Pt want to know when to return to have her cholesterol checked.LOV stated to return in 3 months; she stated she wants it checked before then; I told her I will ask the doctor.

## 2024-03-04 NOTE — Telephone Encounter (Signed)
 Pt called / informed of Dr Dudley response of we will be able to see a greater if any improvement of her cholesterol in 3 months; to exercise, and watch her diet - more vegetables, limit fried,greasy foods. Lipid result given to pt again. Pt agreed to schedule a 3 month appt - call transferred to the front office.

## 2024-03-04 NOTE — Progress Notes (Signed)
 LDL increased to 101 from 94. Spoke with patient about increasing Atorvastatin  dose to 80mg  for primary prevention goal of LDL < 70. Patient voiced understanding.

## 2024-03-04 NOTE — Telephone Encounter (Signed)
 Copied from CRM [id number]. Topic: Clinical - Lab/Test Results >> Mar 04, 2024  1:12 PM [name] wrote: Reason for CRM: Patient called about her results and is wanting to speak with the provider about the Atorvastatin . She just spoke with the provider and the message from provider is below:   LDL increased to 101 from 94. Spoke with patient about increasing Atorvastatin  dose to 80mg  for primary prevention goal of LDL < 70. Patient voiced understanding.    Patients callback number is [phone]

## 2024-03-09 ENCOUNTER — Ambulatory Visit: Payer: Self-pay

## 2024-03-09 ENCOUNTER — Ambulatory Visit (INDEPENDENT_AMBULATORY_CARE_PROVIDER_SITE_OTHER): Payer: Self-pay

## 2024-03-09 VITALS — BP 117/78 | HR 98 | Temp 98.7°F | Ht 62.0 in | Wt 207.2 lb

## 2024-03-09 DIAGNOSIS — I10 Essential (primary) hypertension: Secondary | ICD-10-CM

## 2024-03-09 DIAGNOSIS — R051 Acute cough: Secondary | ICD-10-CM

## 2024-03-09 NOTE — Assessment & Plan Note (Signed)
 Blood pressure controlled today at 117/78.  She reports taking hydrochlorothiazide  12.5 mg tablet daily. - Continue HCTZ 12.5 mg tablet daily

## 2024-03-09 NOTE — Telephone Encounter (Signed)
 FYI Only or Action Required?: FYI only for provider: appointment scheduled on 11/24.  Patient was last seen in primary care on 02/21/2024 by Amilibia, Jaden, DO.  Called Nurse Triage reporting Cough.  Symptoms began a week ago.  Interventions attempted: Rest, hydration, or home remedies.  Symptoms are: unchanged.  Triage Disposition: See HCP Within 4 Hours (Or PCP Triage)  Patient/caregiver understands and will follow disposition?: Yes            Copied from CRM #[id number]. Topic: Clinical - Red Word Triage >> Mar 09, 2024  9:10 AM Farrel B wrote: [facility] that prompted transfer to Nurse Triage: wheezing, coughing, chest is having a burning and tenderness sensation when coughing. Reason for Disposition  Wheezing is present  Answer Assessment - Initial Assessment Questions 1. ONSET: When did the cough begin?      X 1 week   2. SEVERITY: How bad is the cough today?      Intermittent   3. SPUTUM: Describe the color of your sputum (e.g., none, dry cough; clear, white, yellow, green)     Dry cough   4. HEMOPTYSIS: Are you coughing up any blood? If Yes, ask: How much? (e.g., flecks, streaks, tablespoons, etc.)     No   5. DIFFICULTY BREATHING: Are you having difficulty breathing? If Yes, ask: How bad is it? (e.g., mild, moderate, severe)      No   6. FEVER: Do you have a fever? If Yes, ask: What is your temperature, how was it measured, and when did it start?     No   7. CARDIAC HISTORY: Do you have any history of heart disease? (e.g., heart attack, congestive heart failure)      No   8. LUNG HISTORY: Do you have any history of lung disease?  (e.g., pulmonary embolus, asthma, emphysema)     No    10. OTHER SYMPTOMS: Do you have any other symptoms? (e.g., runny nose, wheezing, chest pain)  Wheezing noted   Patient called in with complaints of wheezing, coughing, chest is having a burning and tenderness sensation when coughing. She is not  taking OTC medications for the symptoms. Appointment scheduled for evaluation. Patient agrees with plan of care, and will call back if anything changes, or if symptoms worsen.  Protocols used: Cough - Acute Productive-A-AH

## 2024-03-09 NOTE — Telephone Encounter (Signed)
 Patient is scheduled to see Dr.Syeda this afternoon.
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
## 2024-03-21 LAB — HM MAMMOGRAPHY

## 2024-03-23 ENCOUNTER — Other Ambulatory Visit (HOSPITAL_COMMUNITY): Payer: Self-pay

## 2024-03-23 ENCOUNTER — Other Ambulatory Visit: Payer: Self-pay

## 2024-03-23 DIAGNOSIS — J302 Other seasonal allergic rhinitis: Secondary | ICD-10-CM

## 2024-03-23 MED ORDER — CETIRIZINE HCL 10 MG PO TABS
10.0000 mg | ORAL_TABLET | Freq: Every day | ORAL | 2 refills | Status: AC
  Filled 2024-03-23 – 2024-04-03 (×2): qty 30, 30d supply, fill #0
  Filled 2024-05-20: qty 30, 30d supply, fill #1

## 2024-03-26 NOTE — Progress Notes (Signed)
Internal Medicine Clinic Attending  I was physically present during the key portions of the resident provided service and participated in the medical decision making of patient's management care. I reviewed pertinent patient test results.  The assessment, diagnosis, and plan were formulated together and I agree with the documentation in the resident's note.  Williams, Julie Anne, MD  

## 2024-04-02 ENCOUNTER — Other Ambulatory Visit (HOSPITAL_COMMUNITY): Payer: Self-pay

## 2024-04-03 ENCOUNTER — Other Ambulatory Visit: Payer: Self-pay | Admitting: Student

## 2024-04-03 ENCOUNTER — Other Ambulatory Visit (HOSPITAL_COMMUNITY): Payer: Self-pay

## 2024-04-03 DIAGNOSIS — E782 Mixed hyperlipidemia: Secondary | ICD-10-CM

## 2024-04-06 ENCOUNTER — Encounter (HOSPITAL_COMMUNITY): Payer: Self-pay

## 2024-04-06 ENCOUNTER — Other Ambulatory Visit (HOSPITAL_COMMUNITY): Payer: Self-pay

## 2024-04-06 NOTE — Telephone Encounter (Signed)
 Medication discontinued 03/04/24.

## 2024-04-23 ENCOUNTER — Other Ambulatory Visit (HOSPITAL_COMMUNITY): Payer: Self-pay

## 2024-05-20 ENCOUNTER — Other Ambulatory Visit (HOSPITAL_COMMUNITY): Payer: Self-pay
